# Patient Record
Sex: Female | Born: 1965
Health system: Southern US, Community
[De-identification: ages and names within clinical notes are randomized; demographics above are authoritative.]

## PROBLEM LIST (undated history)

## (undated) DIAGNOSIS — J302 Other seasonal allergic rhinitis: Secondary | ICD-10-CM

## (undated) DIAGNOSIS — IMO0001 Reserved for inherently not codable concepts without codable children: Secondary | ICD-10-CM

## (undated) DIAGNOSIS — N920 Excessive and frequent menstruation with regular cycle: Secondary | ICD-10-CM

## (undated) DIAGNOSIS — D649 Anemia, unspecified: Secondary | ICD-10-CM

## (undated) DIAGNOSIS — C801 Malignant (primary) neoplasm, unspecified: Secondary | ICD-10-CM

## (undated) DIAGNOSIS — T7840XA Allergy, unspecified, initial encounter: Secondary | ICD-10-CM

## (undated) DIAGNOSIS — F419 Anxiety disorder, unspecified: Secondary | ICD-10-CM

## (undated) DIAGNOSIS — M069 Rheumatoid arthritis, unspecified: Secondary | ICD-10-CM

## (undated) DIAGNOSIS — M1990 Unspecified osteoarthritis, unspecified site: Secondary | ICD-10-CM

## (undated) HISTORY — DX: Unspecified osteoarthritis, unspecified site: M19.90

## (undated) HISTORY — PX: SALPINGOOPHORECTOMY: SHX82

## (undated) HISTORY — DX: Allergy, unspecified, initial encounter: T78.40XA

## (undated) HISTORY — PX: OTHER SURGICAL HISTORY: SHX169

## (undated) HISTORY — PX: CHOLECYSTECTOMY: SHX55

## (undated) HISTORY — PX: ABDOMINAL HYSTERECTOMY: SHX81

---
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
## 1999-11-26 HISTORY — PX: APPENDECTOMY: SHX54

## 2002-11-25 DIAGNOSIS — C801 Malignant (primary) neoplasm, unspecified: Secondary | ICD-10-CM

## 2002-11-25 DIAGNOSIS — C439 Malignant melanoma of skin, unspecified: Secondary | ICD-10-CM

## 2002-11-25 HISTORY — DX: Malignant (primary) neoplasm, unspecified: C80.1

## 2002-11-25 HISTORY — DX: Malignant melanoma of skin, unspecified: C43.9

## 2005-04-02 ENCOUNTER — Ambulatory Visit: Payer: Self-pay | Admitting: Internal Medicine

## 2006-02-13 ENCOUNTER — Ambulatory Visit: Payer: Self-pay

## 2007-02-17 ENCOUNTER — Ambulatory Visit: Payer: Self-pay

## 2008-03-10 ENCOUNTER — Ambulatory Visit: Payer: Self-pay

## 2008-10-14 ENCOUNTER — Ambulatory Visit: Payer: Self-pay | Admitting: Family Medicine

## 2008-11-03 ENCOUNTER — Ambulatory Visit: Payer: Self-pay | Admitting: Internal Medicine

## 2012-11-10 ENCOUNTER — Ambulatory Visit: Payer: Self-pay | Admitting: Internal Medicine

## 2013-01-15 ENCOUNTER — Ambulatory Visit: Payer: Self-pay | Admitting: Emergency Medicine

## 2013-08-09 ENCOUNTER — Telehealth: Payer: Self-pay | Admitting: Internal Medicine

## 2013-08-09 ENCOUNTER — Ambulatory Visit: Payer: Self-pay | Admitting: Internal Medicine

## 2013-08-09 MED ORDER — CITALOPRAM HYDROBROMIDE 20 MG PO TABS: ORAL_TABLET | ORAL | Status: DC

## 2013-08-09 NOTE — Telephone Encounter (Signed)
LMTCB

## 2013-08-09 NOTE — Telephone Encounter (Signed)
Citalopram 10 mg 

## 2013-08-09 NOTE — Telephone Encounter (Signed)
Will need to confirm on 10mg  q day.  I am ok to refill #30 with one refill, but she will have to keep the appt.  I will not continue to refill after this.

## 2013-08-09 NOTE — Telephone Encounter (Signed)
Patient returned call, state she would like 20 mg Citalopram sent to the pharmacy and she just need enough to get her through until her appointment. She is only taking 1/2 of the 20 mg tablet so if you want to call her in 10 mg then she would be fine with that. Whatever you decide to do will be fine with her as long she has enough to last her until her appointment.

## 2013-08-09 NOTE — Telephone Encounter (Signed)
Sent in rx for citalopram 20mg  1/2 tablet - to get her through to her appt.  See my note.

## 2013-08-09 NOTE — Telephone Encounter (Signed)
Oka to refill? Pt rescheduled her NP appt to 09/21/13

## 2013-09-21 ENCOUNTER — Ambulatory Visit: Payer: Self-pay | Admitting: Internal Medicine

## 2013-10-14 ENCOUNTER — Ambulatory Visit: Payer: Self-pay | Admitting: Emergency Medicine

## 2013-10-31 ENCOUNTER — Other Ambulatory Visit: Payer: Self-pay | Admitting: Internal Medicine

## 2014-01-03 ENCOUNTER — Ambulatory Visit: Payer: Self-pay | Admitting: Physician Assistant

## 2014-03-05 ENCOUNTER — Ambulatory Visit: Payer: Self-pay

## 2014-09-15 ENCOUNTER — Ambulatory Visit: Payer: Self-pay | Admitting: Internal Medicine

## 2014-11-03 ENCOUNTER — Ambulatory Visit: Payer: Self-pay | Admitting: Physician Assistant

## 2014-12-30 ENCOUNTER — Ambulatory Visit: Payer: Self-pay | Admitting: Family Medicine

## 2015-06-26 ENCOUNTER — Ambulatory Visit
Admission: EM | Admit: 2015-06-26 | Discharge: 2015-06-26 | Disposition: A | Discharge status: Home / Self Care | Payer: 59 | Attending: Family Medicine | Admitting: Family Medicine

## 2015-06-26 DIAGNOSIS — H6593 Unspecified nonsuppurative otitis media, bilateral: Secondary | ICD-10-CM

## 2015-06-26 DIAGNOSIS — J309 Allergic rhinitis, unspecified: Secondary | ICD-10-CM

## 2015-06-26 DIAGNOSIS — J0101 Acute recurrent maxillary sinusitis: Secondary | ICD-10-CM | POA: Diagnosis not present

## 2015-06-26 HISTORY — DX: Anxiety disorder, unspecified: F41.9

## 2015-06-26 MED ORDER — CEFUROXIME AXETIL 250 MG PO TABS: 250.0000 mg | ORAL_TABLET | Freq: Two times a day (BID) | ORAL | Status: DC

## 2015-06-26 MED ORDER — ACETAMINOPHEN 500 MG PO TABS: 1000.0000 mg | ORAL_TABLET | Freq: Four times a day (QID) | ORAL | Status: DC | PRN

## 2015-06-26 MED ORDER — AZITHROMYCIN 250 MG PO TABS: 250.0000 mg | ORAL_TABLET | Freq: Every day | ORAL | Status: DC

## 2015-06-26 MED ORDER — IBUPROFEN 800 MG PO TABS: 800.0000 mg | ORAL_TABLET | Freq: Three times a day (TID) | ORAL | Status: DC

## 2015-06-26 NOTE — ED Notes (Signed)
Started last week with sinus congestion. Now worsening symptoms and increased pain behind eyes

## 2015-06-26 NOTE — ED Provider Notes (Signed)
CSN: [id number]     Arrival date & time 06/26/15  1041 History   First MD Initiated Contact with Patient 06/26/15 1132     Chief Complaint  Patient presents with  . 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  Typically sinus infection twice a year treated with azithromycin works well for her.  Does not do nose sprays.  Ckees, upper teeth and under eyes hurts, right ear itches.  Nonproductive cough.  Headache temples/cheeks.  Motrin 800mg  po q6h resolves pain/headache.  LMP 15 Jun 2015  Taking celexa every other day 1/2 tab.  Patient is a 49 y.o. female presenting with sinusitis. The history is provided by the patient.  Sinusitis Pain details:    Location:  Maxillary   Quality:  Aching   Severity:  Moderate   Duration:  2 weeks   Timing:  Constant Duration:  2 weeks Progression:  Worsening Chronicity:  Recurrent Context: allergies   Context: not chemical odor, not deviated nasal septum, not recent URI and not smoke inhalation   Relieved by:  Nothing Worsened by:  Position changes and outdoor exposure Ineffective treatments:  Acetaminophen Associated symptoms: congestion, cough, headaches and rhinorrhea   Associated symptoms: no chest pain, no chills, no ear pain, no fatigue, no fever, no hoarse voice, no mouth breathing, no nausea, no shortness of breath, no sneezing, no snoring, no sore throat, no swollen glands, no tooth pain, no vertigo, no vomiting and no wheezing   Congestion:    Location:  Nasal   Interferes with sleep: no     Interferes with eating/drinking: no   Cough:    Cough characteristics:  Non-productive   Severity:  Mild   Onset quality:  Sudden   Duration:  1 week   Timing:  Intermittent   Progression:  Waxing and waning    Chronicity:  New Headaches:    Severity:  Moderate   Onset quality:  Gradual   Duration:  1 week   Timing:  Intermittent   Progression:  Worsening   Chronicity:  Recurrent Rhinorrhea:    Quality:  Clear   Severity:  Mild   Duration:  1 week   Timing:  Intermittent   Progression:  Waxing and waning Risk factors: allergic reaction   Risk factors: no asthma, no BiPAP, no COPD, no CPAP use, no cystic fibrosis, no diabetes, no immune deficiency, no nasal cannula, no nasal polyps and no smoke exposure     Past Medical History  Diagnosis Date  . Anxiety    Past Surgical History  Procedure Laterality Date  . Appendectomy    . Cholecystectomy    . Cesarean section      x2   Family History  Problem Relation Age of Onset  . Hypertension Mother   . Hypertension Father   . Heart failure Father    History  Substance Use Topics  . Smoking status: Never Smoker   . Smokeless tobacco: Not on file  . Alcohol Use: No   OB History    No data available     Review of Systems  Constitutional: Negative for fever, chills, diaphoresis, activity change, appetite change and fatigue.  HENT: Positive for congestion, rhinorrhea and sinus pressure. Negative for dental problem, drooling, ear discharge, ear pain, facial swelling, hearing loss, hoarse voice, mouth sores, nosebleeds, postnasal drip, sneezing,  sore throat, tinnitus, trouble swallowing and voice change.   Eyes: Negative for photophobia, pain, discharge, redness, itching and visual disturbance.  Respiratory: Positive for cough. Negative for snoring, choking, chest tightness, shortness of breath, wheezing and stridor.   Cardiovascular: Negative for chest pain, palpitations and leg swelling.  Gastrointestinal: Negative for nausea, vomiting, abdominal pain, diarrhea, constipation and blood in stool.  Endocrine: Negative for cold intolerance and heat intolerance.  Genitourinary: Negative for dysuria and hematuria.  Musculoskeletal:  Negative for myalgias, back pain, joint swelling, arthralgias, gait problem, neck pain and neck stiffness.  Skin: Negative for color change, pallor, rash and wound.  Allergic/Immunologic: Positive for environmental allergies. Negative for food allergies.  Neurological: Positive for headaches. Negative for dizziness, vertigo, tremors, seizures, syncope, facial asymmetry, speech difficulty, weakness, light-headedness and numbness.  Hematological: Negative for adenopathy. Does not bruise/bleed easily.  Psychiatric/Behavioral: Negative for suicidal ideas, behavioral problems, confusion, sleep disturbance and agitation. The patient is not nervous/anxious.     Allergies  Penicillins  Home Medications   Prior to Admission medications   Medication Sig Start Date End Date Taking? Authorizing Provider  citalopram (CELEXA) 20 MG tablet 1/2 tablet q day 08/09/13  Yes Einar Pheasant, MD  acetaminophen (TYLENOL) 500 MG tablet Take 2 tablets (1,000 mg total) by mouth every 6 (six) hours as needed for moderate pain or fever. 06/26/15   Olen Cordial, NP  azithromycin (ZITHROMAX) 250 MG tablet Take 1 tablet (250 mg total) by mouth daily. Take first 2 tablets together, then 1 every day until finished. 06/26/15   Olen Cordial, NP  cefUROXime (CEFTIN) 250 MG tablet Take 1 tablet (250 mg total) by mouth 2 (two) times daily with a meal. 06/26/15   Olen Cordial, NP  ibuprofen (ADVIL,MOTRIN) 800 MG tablet Take 1 tablet (800 mg total) by mouth 3 (three) times daily. 06/26/15   Aura Fey Betancourt, NP   BP 143/71 mmHg  Pulse 94  Temp(Src) 98.1 F (36.7 C) (Tympanic)  Resp 18  Ht 5\' 3"  (1.6 m)  Wt 175 lb (79.379 kg)  BMI 31.01 kg/m2  SpO2 100%  LMP 06/15/2015 (Exact Date) Physical Exam  Constitutional: She is oriented to person, place, and time. Vital signs are normal. She appears well-developed and well-nourished. No distress.  HENT:  Head: Normocephalic and atraumatic.  Right Ear: Hearing, external ear  and ear canal normal. A middle ear effusion is present.  Left Ear: Hearing, tympanic membrane, external ear and ear canal normal.  Nose: Mucosal edema and rhinorrhea present. No nose lacerations, sinus tenderness, nasal deformity, septal deviation or nasal septal hematoma. No epistaxis.  No foreign bodies. Right sinus exhibits maxillary sinus tenderness. Right sinus exhibits no frontal sinus tenderness. Left sinus exhibits maxillary sinus tenderness. Left sinus exhibits no frontal sinus tenderness.  Mouth/Throat: Uvula is midline. Mucous membranes are not pale, not dry and not cyanotic. She does not have dentures. No oral lesions. No trismus in the jaw. Normal dentition. No dental abscesses, uvula swelling, lacerations or dental caries. Posterior oropharyngeal edema and posterior oropharyngeal erythema present. No oropharyngeal exudate or tonsillar abscesses.  Cobblestoning posterior pharynx; bilateral TMs with air fluid level; bilateral nasal turbinates with edema/erythema clear discharge; palpation frontal sinuses decreases discomfort/headache  Eyes: Conjunctivae, EOM and lids are normal. Pupils are equal, round, and reactive to light. Right eye exhibits no discharge. Left eye exhibits no discharge.  Neck: Trachea normal and normal range of motion. Neck supple. No tracheal deviation present. No thyromegaly present.  Cardiovascular: Normal  rate, regular rhythm, normal heart sounds and intact distal pulses.  Exam reveals no gallop and no friction rub.   No murmur heard. Pulmonary/Chest: Effort normal and breath sounds normal. No stridor. No respiratory distress. She has no decreased breath sounds. She has no wheezes. She has no rhonchi. She has no rales. She exhibits no tenderness.  Abdominal: Soft. Bowel sounds are normal. She exhibits no distension and no mass. There is no tenderness. There is no rebound and no guarding.  Dull to percussion x4 quads  Musculoskeletal: Normal range of motion. She  exhibits no edema or tenderness.  Lymphadenopathy:    She has no cervical adenopathy.  Neurological: She is alert and oriented to person, place, and time. She exhibits normal muscle tone. Coordination normal.  Skin: Skin is warm and intact. No rash noted. She is not diaphoretic. No erythema. No pallor.  Psychiatric: She has a normal mood and affect. Her speech is normal and behavior is normal. Judgment and thought content normal. Cognition and memory are normal.  Nursing note and vitals reviewed.   ED Course  Procedures (including critical care time) Labs Review Labs Reviewed - No data to display  Imaging Review No results found.   MDM   1. Acute recurrent maxillary sinusitis   2. Otitis media with effusion, bilateral   3. Allergic rhinitis, unspecified allergic rhinitis type    Allergic to penicillins.  Azithromycin has worked in the past to resolve biannual sinusitis.  Given Rx for azithromycin 500mg  today then 250mg  po daily  X 4 days.  Refuses nose sprays.  Given ceftin 250mg  po BID x 10 days if no improvement with azithromycin or worsening after 48 hours.  DIscussed with patient that 15% patients can have cross sensitivity to cephalosporins if penicillin allergy.  Levofloxin with black box warning now.  Discussed possible palpitations with azithromycin and celexa patient not taking 1/2 tab daily usually every other day.  She will monitor for palpitations and stop azithromycin if they occur.   No evidence of systemic bacterial infection, non toxic and well hydrated.  I do not see where any further testing or imaging is necessary at this time.   I will suggest supportive care, rest, good hygiene and encourage the patient to take adequate fluids.  The patient is to return to clinic or EMERGENCY ROOM if symptoms worsen or change significantly.  Exitcare handout on sinusitis given to patient. 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 Patient verbalized  agreement and understanding of treatment plan and had no further questions at this time.   P2:  Hand washing and cover cough  Supportive treatment.   No evidence of invasive bacterial infection, non toxic and well hydrated.  This is most likely self limiting viral infection.  I do not see where any further testing or imaging is necessary at this time.   I will suggest supportive care, rest, good hygiene and encourage the patient to take adequate fluids.  The patient is to return to clinic or EMERGENCY ROOM if symptoms worsen or change significantly e.g. ear pain, fever, purulent discharge from ears or bleeding.   Patient verbalized agreement and understanding of treatment plan.    Patient may use normal saline nasal spray as needed.  Consider antihistamine or nasal steroid use.  Avoid triggers if possible.  Shower prior to bedtime if exposed to triggers.  If allergic dust/dust mites recommend mattress/pillow covers/encasements; washing linens, vacuuming, sweeping, dusting weekly.  Call or return to  clinic as needed if these symptoms worsen or fail to improve as anticipated.     Patient verbalized understanding of instructions, agreed with plan of care and had no further questions at this time.  P2:  Avoidance and hand washing.  Olen Cordial, NP 06/26/15 1839

## 2015-06-26 NOTE — Discharge Instructions (Signed)
Otitis Media With Effusion Otitis media with effusion is the presence of fluid in the middle ear. This is a common problem in children, which often follows ear infections. It may be present for weeks or longer after the infection. Unlike an acute ear infection, otitis media with effusion refers only to fluid behind the ear drum and not infection. Children with repeated ear and sinus infections and allergy problems are the most likely to get otitis media with effusion. CAUSES  The most frequent cause of the fluid buildup is dysfunction of the eustachian tubes. These are the tubes that drain fluid in the ears to the back of the nose (nasopharynx). SYMPTOMS   The main symptom of this condition is hearing loss. As a result, you or your child may:  Listen to the TV at a loud volume.  Not respond to questions.  Ask "what" often when spoken to.  Mistake or confuse one sound or word for another.  There may be a sensation of fullness or pressure but usually not pain. DIAGNOSIS   Your health care provider will diagnose this condition by examining you or your child's ears.  Your health care provider may test the pressure in you or your child's ear with a tympanometer.  A hearing test may be conducted if the problem persists. TREATMENT   Treatment depends on the duration and the effects of the effusion.  Antibiotics, decongestants, nose drops, and cortisone-type drugs (tablets or nasal spray) may not be helpful.  Children with persistent ear effusions may have delayed language or behavioral problems. Children at risk for developmental delays in hearing, learning, and speech may require referral to a specialist earlier than children not at risk.  You or your child's health care provider may suggest a referral to an ear, nose, and throat surgeon for treatment. The following may help restore normal hearing:  Drainage of fluid.  Placement of ear tubes (tympanostomy tubes).  Removal of adenoids  (adenoidectomy). HOME CARE INSTRUCTIONS   Avoid secondhand smoke.  Infants who are breastfed are less likely to have this condition.  Avoid feeding infants while they are lying flat.  Avoid known environmental allergens.  Avoid people who are sick. SEEK MEDICAL CARE IF:   Hearing is not better in 3 months.  Hearing is worse.  Ear pain.  Drainage from the ear.  Dizziness. MAKE SURE YOU:   Understand these instructions.  Will watch your condition.  Will get help right away if you are not doing well or get worse. Document Released: 12/19/2004 Document Revised: 03/28/2014 Document Reviewed: 06/08/2013 [facility] Patient Information 2015 [place], [place]. This information is not intended to replace advice given to you by your health care provider. Make sure you discuss any questions you have with your health care provider. Sinusitis Sinusitis is redness, soreness, and inflammation of the paranasal sinuses. Paranasal sinuses are air pockets within the bones of your face (beneath the eyes, the middle of the forehead, or above the eyes). In healthy paranasal sinuses, mucus is able to drain out, and air is able to circulate through them by way of your nose. However, when your paranasal sinuses are inflamed, mucus and air can become trapped. This can allow bacteria and other germs to grow and cause infection. Sinusitis can develop quickly and last only a short time (acute) or continue over a long period (chronic). Sinusitis that lasts for more than 12 weeks is considered chronic.  CAUSES  Causes of sinusitis include:  Allergies.  Structural abnormalities, such as  displacement of the cartilage that separates your nostrils (deviated septum), which can decrease the air flow through your nose and sinuses and affect sinus drainage.  Functional abnormalities, such as when the small hairs (cilia) that line your sinuses and help remove mucus do not work properly or are not present. SIGNS AND  SYMPTOMS  Symptoms of acute and chronic sinusitis are the same. The primary symptoms are pain and pressure around the affected sinuses. Other symptoms include:  Upper toothache.  Earache.  Headache.  Bad breath.  Decreased sense of smell and taste.  A cough, which worsens when you are lying flat.  Fatigue.  Fever.  Thick drainage from your nose, which often is green and may contain pus (purulent).  Swelling and warmth over the affected sinuses. DIAGNOSIS  Your health care provider will perform a physical exam. During the exam, your health care provider may:  Look in your nose for signs of abnormal growths in your nostrils (nasal polyps).  Tap over the affected sinus to check for signs of infection.  View the inside of your sinuses (endoscopy) using an imaging device that has a light attached (endoscope). If your health care provider suspects that you have chronic sinusitis, one or more of the following tests may be recommended:  Allergy tests.  Nasal culture. A sample of mucus is taken from your nose, sent to a lab, and screened for bacteria.  Nasal cytology. A sample of mucus is taken from your nose and examined by your health care provider to determine if your sinusitis is related to an allergy. TREATMENT  Most cases of acute sinusitis are related to a viral infection and will resolve on their own within 10 days. Sometimes medicines are prescribed to help relieve symptoms (pain medicine, decongestants, nasal steroid sprays, or saline sprays).  However, for sinusitis related to a bacterial infection, your health care provider will prescribe antibiotic medicines. These are medicines that will help kill the bacteria causing the infection.  Rarely, sinusitis is caused by a fungal infection. In theses cases, your health care provider will prescribe antifungal medicine. For some cases of chronic sinusitis, surgery is needed. Generally, these are cases in which sinusitis recurs  more than 3 times per year, despite other treatments. HOME CARE INSTRUCTIONS   Drink plenty of water. Water helps thin the mucus so your sinuses can drain more easily.  Use a humidifier.  Inhale steam 3 to 4 times a day (for example, sit in the bathroom with the shower running).  Apply a warm, moist washcloth to your face 3 to 4 times a day, or as directed by your health care provider.  Use saline nasal sprays to help moisten and clean your sinuses.  Take medicines only as directed by your health care provider.  If you were prescribed either an antibiotic or antifungal medicine, finish it all even if you start to feel better. SEEK IMMEDIATE MEDICAL CARE IF:  You have increasing pain or severe headaches.  You have nausea, vomiting, or drowsiness.  You have swelling around your face.  You have vision problems.  You have a stiff neck.  You have difficulty breathing. MAKE SURE YOU:   Understand these instructions.  Will watch your condition.  Will get help right away if you are not doing well or get worse. Document Released: 11/11/2005 Document Revised: 03/28/2014 Document Reviewed: 11/26/2011 [facility] Patient Information 2015 [place], [place]. This information is not intended to replace advice given to you by your health care provider. Make sure  you discuss any questions you have with your health care provider.

## 2015-09-01 ENCOUNTER — Ambulatory Visit
Admission: EM | Admit: 2015-09-01 | Discharge: 2015-09-01 | Disposition: A | Discharge status: Home / Self Care | Payer: Managed Care, Other (non HMO) | Attending: Family Medicine | Admitting: Family Medicine

## 2015-09-01 ENCOUNTER — Encounter: Payer: Self-pay | Admitting: Emergency Medicine

## 2015-09-01 DIAGNOSIS — J0100 Acute maxillary sinusitis, unspecified: Secondary | ICD-10-CM | POA: Diagnosis not present

## 2015-09-01 DIAGNOSIS — J0110 Acute frontal sinusitis, unspecified: Secondary | ICD-10-CM | POA: Diagnosis not present

## 2015-09-01 MED ORDER — IBUPROFEN 800 MG PO TABS: 800.0000 mg | ORAL_TABLET | Freq: Three times a day (TID) | ORAL | Status: DC | PRN

## 2015-09-01 MED ORDER — AZITHROMYCIN 250 MG PO TABS: ORAL_TABLET | ORAL | Status: DC

## 2015-09-01 NOTE — Discharge Instructions (Signed)

## 2015-09-01 NOTE — ED Notes (Signed)
Sinus , facial pain cough green sputum ear pain for 2 weeks

## 2015-09-01 NOTE — ED Provider Notes (Signed)
[facility] Provider Note  ____________________________________________  Time seen: Approximately 11:54 AM  I have reviewed the triage vital signs and the nursing notes.   HISTORY  Chief Complaint Facial Pain   HPI Robin Kelly is a 49 y.o. female presents for complaint of runny nose, congestion and sinus pressure. States runny nose and congestion x 2 weeks, worsened with sinus pressure over last few days. States occasional cough. Denies fever, chest pain, shortness of breath or abdominal pain. Reports continues to eat and drink well.    Past Medical History  Diagnosis Date  . Anxiety     There are no active problems to display for this patient.   Past Surgical History  Procedure Laterality Date  . Appendectomy    . Cholecystectomy    . Cesarean section      x2    Current Outpatient Rx  Name  Route  Sig  Dispense  Refill  .           .           .           .           .             Allergies Penicillins  Family History  Problem Relation Age of Onset  . Hypertension Mother   . Hypertension Father   . Heart failure Father     Social History Social History  Substance Use Topics  . Smoking status: Never Smoker   . Smokeless tobacco: None  . Alcohol Use: No    Review of Systems Constitutional: No fever/chills Eyes: No visual changes. ENT: positive runny nose, congestion and sinus pressure. Positive ear "fullness"  Cardiovascular: Denies chest pain. Respiratory: Denies shortness of breath. Gastrointestinal: No abdominal pain.  No nausea, no vomiting.  No diarrhea.  No constipation. Genitourinary: Negative for dysuria. Musculoskeletal: Negative for back pain. Skin: Negative for rash. Neurological: Negative for headaches, focal weakness or numbness.  10-point ROS otherwise negative.  ____________________________________________   PHYSICAL EXAM:  VITAL SIGNS: ED Triage Vitals  Enc Vitals Group      BP 09/01/15 1131 120/65 mmHg     Pulse Rate 09/01/15 1131 98     Resp 09/01/15 1131 16     Temp 09/01/15 1131 98.2 F (36.8 C)     Temp Source 09/01/15 1131 Tympanic     SpO2 09/01/15 1131 99 %     Weight 09/01/15 1131 170 lb (77.111 kg)     Height 09/01/15 1131 5\' 2"  (1.575 m)     Head Cir --      Peak Flow --      Pain Score 09/01/15 1135 5     Pain Loc --      Pain Edu? --      Excl. in [place]? --     Constitutional: Alert and oriented. Well appearing and in no acute distress. Eyes: Conjunctivae are normal. PERRL. EOMI. Head: Atraumatic. Mod TTP bilateral maxillary and frontal sinus. No erythema  Ears: no erythema, bilateral serous otitis  Nose: nasal congestion with bilateral turbinate edema and bogginess, nares patent.   Mouth/Throat: Mucous membranes are moist.  Oropharynx non-erythematous. Neck: No stridor.  No cervical spine tenderness to palpation. Hematological/Lymphatic/Immunilogical: No cervical lymphadenopathy. Cardiovascular: Normal rate, regular rhythm. Grossly normal heart sounds.  Good peripheral circulation. Respiratory: Normal respiratory effort.  No retractions. Lungs CTAB. Gastrointestinal: Soft and nontender. No distention. Normal Bowel  sounds. No CVA tenderness. Musculoskeletal: No lower or upper extremity tenderness nor edema.  No joint effusions. Bilateral pedal pulses equal and easily palpated.  Neurologic:  Normal speech and language. No gross focal neurologic deficits are appreciated. No gait instability. Skin:  Skin is warm, dry and intact. No rash noted. Psychiatric: Mood and affect are normal. Speech and behavior are normal.  ____________________________________________   LABS (all labs ordered are listed, but only abnormal results are displayed)  Labs Reviewed - No data to display ____________________________________________   INITIAL IMPRESSION / ASSESSMENT AND PLAN / ED COURSE  Pertinent labs & imaging results that were available during  my care of the patient were reviewed by me and considered in my medical decision making (see chart for details).  Very well appearing. No acute distress. Sinus congestion and pressure x 2 weeks. Will treat with oral azithromycin, prn ibuprofen and supportive measures. Discussed follow up with Primary care physician this week. Discussed follow up and return parameters including no resolution or any worsening concerns. Patient verbalized understanding and agreed to plan.   ____________________________________________   FINAL CLINICAL IMPRESSION(S) / ED DIAGNOSES  Final diagnoses:  Acute maxillary sinusitis, recurrence not specified  Acute frontal sinusitis, recurrence not specified       Marylene Land, NP 09/01/15 1205

## 2015-12-22 LAB — HM PAP SMEAR

## 2015-12-27 LAB — HM MAMMOGRAPHY

## 2016-01-25 ENCOUNTER — Other Ambulatory Visit: Payer: Managed Care, Other (non HMO)

## 2016-01-30 ENCOUNTER — Encounter
Admission: RE | Admit: 2016-01-30 | Discharge: 2016-01-30 | Disposition: A | Discharge status: Home / Self Care | Payer: Managed Care, Other (non HMO) | Source: Ambulatory Visit | Attending: Obstetrics and Gynecology | Admitting: Obstetrics and Gynecology

## 2016-01-30 ENCOUNTER — Other Ambulatory Visit: Payer: Managed Care, Other (non HMO)

## 2016-01-30 DIAGNOSIS — Z01812 Encounter for preprocedural laboratory examination: Secondary | ICD-10-CM | POA: Diagnosis present

## 2016-01-30 DIAGNOSIS — Z01810 Encounter for preprocedural cardiovascular examination: Secondary | ICD-10-CM | POA: Insufficient documentation

## 2016-01-30 HISTORY — DX: Excessive and frequent menstruation with regular cycle: N92.0

## 2016-01-30 HISTORY — DX: Reserved for inherently not codable concepts without codable children: IMO0001

## 2016-01-30 LAB — CBC
HCT: 34.9 % — ABNORMAL LOW (ref 35.0–47.0)
Hemoglobin: 10.9 g/dL — ABNORMAL LOW (ref 12.0–16.0)
MCH: 26.3 pg (ref 26.0–34.0)
MCHC: 31.3 g/dL — AB (ref 32.0–36.0)
MCV: 83.9 fL (ref 80.0–100.0)
Platelets: 612 10*3/uL — ABNORMAL HIGH (ref 150–440)
RBC: 4.16 MIL/uL (ref 3.80–5.20)
RDW: 22.5 % — AB (ref 11.5–14.5)
WBC: 9 10*3/uL (ref 3.6–11.0)

## 2016-01-30 LAB — COMPREHENSIVE METABOLIC PANEL
ALBUMIN: 4.1 g/dL (ref 3.5–5.0)
ALT: 24 U/L (ref 14–54)
ANION GAP: 9 (ref 5–15)
AST: 21 U/L (ref 15–41)
Alkaline Phosphatase: 66 U/L (ref 38–126)
BUN: 9 mg/dL (ref 6–20)
CO2: 21 mmol/L — AB (ref 22–32)
Calcium: 9 mg/dL (ref 8.9–10.3)
Chloride: 110 mmol/L (ref 101–111)
Creatinine, Ser: 0.65 mg/dL (ref 0.44–1.00)
GFR calc non Af Amer: >60 mL/min (ref >60)
GLUCOSE: 73 mg/dL (ref 65–99)
POTASSIUM: 4 mmol/L (ref 3.5–5.1)
SODIUM: 140 mmol/L (ref 135–145)
TOTAL PROTEIN: 7.2 g/dL (ref 6.5–8.1)
Total Bilirubin: 0.3 mg/dL (ref 0.3–1.2)

## 2016-01-30 LAB — TYPE AND SCREEN
ABO/RH(D): O POS
ANTIBODY SCREEN: NEGATIVE

## 2016-01-30 LAB — ABO/RH: ABO/RH(D): O POS

## 2016-01-30 NOTE — Pre-Procedure Instructions (Signed)
AS INSTRUCTED BY DR Amie Critchley, EKG,LLABS CALLED AND FAXED TO DR C SCOTT. SPOKE WITH RASHEEDA. ALSO CALLED AND FAXED TO DR Glennon Mac. SPOKE WITH LYDIA

## 2016-01-30 NOTE — Patient Instructions (Signed)
  Your procedure is scheduled on: 02/06/16 Tues Report to Day Surgery.2nd floor medical mall To find out your arrival time please call [phone] between 1PM - 3PM on 02/05/16 Mon.  Remember: Instructions that are not followed completely may result in serious medical risk, up to and including death, or upon the discretion of your surgeon and anesthesiologist your surgery may need to be rescheduled.    _x___ 1. Do not eat food or drink liquids after midnight. No gum chewing or hard candies.     ____ 2. No Alcohol for 24 hours before or after surgery.   ____ 3. Bring all medications with you on the day of surgery if instructed.    _x___ 4. Notify your doctor if there is any change in your medical condition     (cold, fever, infections).     Do not wear jewelry, make-up, hairpins, clips or nail polish.  Do not wear lotions, powders, or perfumes. You may wear deodorant.  Do not shave 48 hours prior to surgery. Men may shave face and neck.  Do not bring valuables to the hospital.    [facility] is not responsible for any belongings or valuables.               Contacts, dentures or bridgework may not be worn into surgery.  Leave your suitcase in the car. After surgery it may be brought to your room.  For patients admitted to the hospital, discharge time is determined by your                treatment team.   Patients discharged the day of surgery will not be allowed to drive home.   Please read over the following fact sheets that you were given:      ____ Take these medicines the morning of surgery with A SIP OF WATER:    1. None  2.   3.   4.  5.  6.  ____ Fleet Enema (as directed)   ____ Use CHG Soap as directed  ____ Use inhalers on the day of surgery  ____ Stop metformin 2 days prior to surgery    ____ Take 1/2 of usual insulin dose the night before surgery and none on the morning of surgery.   _x___ Stop Coumadin/Plavix/aspirin on Stop aspirin today may take  Tylenol  ____ Stop Anti-inflammatories on    ____ Stop supplements until after surgery.    ____ Bring C-Pap to the hospital.

## 2016-02-02 ENCOUNTER — Telehealth: Payer: Self-pay | Admitting: Internal Medicine

## 2016-02-02 NOTE — Telephone Encounter (Signed)
Robin Kelly [id number] called from [facility] stating pt is needing a surgery clearance due to EKG and platelet count 612. Pt is due for Surgery on 02/06/2016. No appt avail to sch. Let me know where to put pt? Robin Kelly states she's sorry she just found out. Thank you!

## 2016-02-02 NOTE — Pre-Procedure Instructions (Signed)
Received fax from Dr Nicki Reaper office (was sent 02/01/16 at 1702) noting patient had not been seen in their office in "years". Contacted Nancy at [place] and sent copies to her.

## 2016-02-02 NOTE — Telephone Encounter (Signed)
Disregard needing appt. Robin Kelly called back from [facility] stating that pt is going to Dr Ubaldo Glassing office at [facility]. Thank You!

## 2016-02-05 NOTE — Pre-Procedure Instructions (Signed)
[place] RISK 02/02/16

## 2016-02-06 ENCOUNTER — Ambulatory Visit: Payer: Managed Care, Other (non HMO) | Admitting: Anesthesiology

## 2016-02-06 ENCOUNTER — Encounter
Admission: RE | Disposition: A | Discharge status: Home / Self Care | Payer: Self-pay | Source: Ambulatory Visit | Attending: Obstetrics and Gynecology

## 2016-02-06 ENCOUNTER — Ambulatory Visit
Admission: RE | Admit: 2016-02-06 | Discharge: 2016-02-06 | Disposition: A | Discharge status: Home / Self Care | Payer: Managed Care, Other (non HMO) | Source: Ambulatory Visit | Attending: Obstetrics and Gynecology | Admitting: Obstetrics and Gynecology

## 2016-02-06 DIAGNOSIS — Z79899 Other long term (current) drug therapy: Secondary | ICD-10-CM | POA: Insufficient documentation

## 2016-02-06 DIAGNOSIS — N840 Polyp of corpus uteri: Secondary | ICD-10-CM | POA: Diagnosis not present

## 2016-02-06 DIAGNOSIS — Z9049 Acquired absence of other specified parts of digestive tract: Secondary | ICD-10-CM | POA: Insufficient documentation

## 2016-02-06 DIAGNOSIS — R0602 Shortness of breath: Secondary | ICD-10-CM | POA: Diagnosis not present

## 2016-02-06 DIAGNOSIS — D259 Leiomyoma of uterus, unspecified: Secondary | ICD-10-CM | POA: Diagnosis not present

## 2016-02-06 DIAGNOSIS — N920 Excessive and frequent menstruation with regular cycle: Secondary | ICD-10-CM | POA: Diagnosis not present

## 2016-02-06 DIAGNOSIS — F419 Anxiety disorder, unspecified: Secondary | ICD-10-CM | POA: Insufficient documentation

## 2016-02-06 DIAGNOSIS — N939 Abnormal uterine and vaginal bleeding, unspecified: Secondary | ICD-10-CM | POA: Insufficient documentation

## 2016-02-06 DIAGNOSIS — Z880 Allergy status to penicillin: Secondary | ICD-10-CM | POA: Diagnosis not present

## 2016-02-06 DIAGNOSIS — D219 Benign neoplasm of connective and other soft tissue, unspecified: Secondary | ICD-10-CM | POA: Diagnosis present

## 2016-02-06 DIAGNOSIS — N921 Excessive and frequent menstruation with irregular cycle: Secondary | ICD-10-CM | POA: Diagnosis present

## 2016-02-06 DIAGNOSIS — D649 Anemia, unspecified: Secondary | ICD-10-CM | POA: Insufficient documentation

## 2016-02-06 HISTORY — PX: DILATATION & CURETTAGE/HYSTEROSCOPY WITH MYOSURE: SHX6511

## 2016-02-06 HISTORY — PX: POLYPECTOMY: SHX5525

## 2016-02-06 LAB — POCT PREGNANCY, URINE: Preg Test, Ur: NEGATIVE

## 2016-02-06 SURGERY — DILATATION & CURETTAGE/HYSTEROSCOPY WITH MYOSURE
Anesthesia: General

## 2016-02-06 MED ORDER — ONDANSETRON HCL 4 MG/2ML IJ SOLN
INTRAMUSCULAR | Status: DC | PRN
  Administered 2016-02-06: 4 mg via INTRAVENOUS

## 2016-02-06 MED ORDER — PROPOFOL 10 MG/ML IV BOLUS
INTRAVENOUS | Status: DC | PRN
  Administered 2016-02-06: 200 mg via INTRAVENOUS

## 2016-02-06 MED ORDER — FAMOTIDINE 20 MG PO TABS
ORAL_TABLET | ORAL | Status: AC
  Administered 2016-02-06: 20 mg via ORAL
  Filled 2016-02-06: qty 1

## 2016-02-06 MED ORDER — MIDAZOLAM HCL 2 MG/2ML IJ SOLN
INTRAMUSCULAR | Status: DC | PRN
  Administered 2016-02-06: 2 mg via INTRAVENOUS

## 2016-02-06 MED ORDER — FAMOTIDINE 20 MG PO TABS
20.0000 mg | ORAL_TABLET | Freq: Once | ORAL | Status: AC
  Administered 2016-02-06: 20 mg via ORAL

## 2016-02-06 MED ORDER — LACTATED RINGERS IV SOLN
INTRAVENOUS | Status: DC
  Administered 2016-02-06: 15:00:00 via INTRAVENOUS

## 2016-02-06 MED ORDER — IBUPROFEN 600 MG PO TABS: 600.0000 mg | ORAL_TABLET | Freq: Four times a day (QID) | ORAL | Status: DC | PRN

## 2016-02-06 MED ORDER — LACTATED RINGERS IR SOLN
Status: DC | PRN
  Administered 2016-02-06: 2100 mL

## 2016-02-06 MED ORDER — LACTATED RINGERS IV SOLN: INTRAVENOUS | Status: DC

## 2016-02-06 MED ORDER — SILVER NITRATE-POT NITRATE 75-25 % EX MISC
CUTANEOUS | Status: DC | PRN
  Administered 2016-02-06: 2

## 2016-02-06 MED ORDER — FENTANYL CITRATE (PF) 100 MCG/2ML IJ SOLN
INTRAMUSCULAR | Status: DC | PRN
  Administered 2016-02-06 (×2): 50 ug via INTRAVENOUS

## 2016-02-06 MED ORDER — FENTANYL CITRATE (PF) 100 MCG/2ML IJ SOLN: 25.0000 ug | INTRAMUSCULAR | Status: DC | PRN

## 2016-02-06 MED ORDER — SILVER NITRATE-POT NITRATE 75-25 % EX MISC
CUTANEOUS | Status: AC
  Filled 2016-02-06: qty 4

## 2016-02-06 MED ORDER — TRAMADOL HCL 50 MG PO TABS: 50.0000 mg | ORAL_TABLET | Freq: Four times a day (QID) | ORAL | Status: DC | PRN

## 2016-02-06 MED ORDER — DEXAMETHASONE SODIUM PHOSPHATE 10 MG/ML IJ SOLN
INTRAMUSCULAR | Status: DC | PRN
  Administered 2016-02-06: 5 mg via INTRAVENOUS

## 2016-02-06 SURGICAL SUPPLY — 21 items
ABLATOR ENDOMETRIAL MYOSURE (ABLATOR) ×3 IMPLANT
CANISTER SUC SOCK COL 7IN (MISCELLANEOUS) ×3 IMPLANT
CATH ROBINSON RED A/P 16FR (CATHETERS) ×3 IMPLANT
DRESSING TELFA 4X3 1S ST N-ADH (GAUZE/BANDAGES/DRESSINGS) ×3 IMPLANT
ELECT REM PT RETURN 9FT ADLT (ELECTROSURGICAL) ×3
ELECTRODE REM PT RTRN 9FT ADLT (ELECTROSURGICAL) ×1 IMPLANT
GLOVE BIO SURGEON STRL SZ7 (GLOVE) ×3 IMPLANT
GLOVE BIOGEL PI IND STRL 7.5 (GLOVE) ×1 IMPLANT
GLOVE BIOGEL PI INDICATOR 7.5 (GLOVE) ×2
GOWN STRL REUS W/ TWL LRG LVL3 (GOWN DISPOSABLE) ×1 IMPLANT
GOWN STRL REUS W/TWL LRG LVL3 (GOWN DISPOSABLE) ×2
IV LACTATED RINGER IRRG 3000ML (IV SOLUTION) ×2
IV LR IRRIG 3000ML ARTHROMATIC (IV SOLUTION) ×1 IMPLANT
KIT RM TURNOVER CYSTO AR (KITS) ×3 IMPLANT
MYOSURE LITE POLYP REMOVAL (MISCELLANEOUS) ×3 IMPLANT
PACK DNC HYST (MISCELLANEOUS) ×3 IMPLANT
PAD OB MATERNITY 4.3X12.25 (PERSONAL CARE ITEMS) ×3 IMPLANT
PAD PREP 24X41 OB/GYN DISP (PERSONAL CARE ITEMS) ×3 IMPLANT
TUBING CONNECTING 10 (TUBING) ×2 IMPLANT
TUBING CONNECTING 10' (TUBING) ×1
TUBING HYSTEROSCOPY DOLPHIN (MISCELLANEOUS) ×3 IMPLANT

## 2016-02-06 NOTE — Discharge Instructions (Signed)

## 2016-02-06 NOTE — H&P (Signed)
History and Physical Interval Note:  Robin Kelly  has presented today for surgery, with the diagnosis of abnormal uterine bleeding,endometrial polyp  The various methods of treatment have been discussed with the patient and family. After consideration of risks, benefits and other options for treatment, the patient has consented to  Procedure(s): [place] (N/A) POLYPECTOMY (N/A) as a surgical intervention .  The patient's history has been reviewed, patient examined, no change in status, stable for surgery.  I have reviewed the patient's chart and labs.  Questions were answered to the patient's satisfaction.     Will Bonnet, MD 02/06/2016 3:56 PM

## 2016-02-06 NOTE — Anesthesia Postprocedure Evaluation (Signed)
Anesthesia Post Note  Patient: Robin Kelly  Procedure(s) Performed: Procedure(s) (LRB): DILATATION & CURETTAGE HYSTEROSCOPY  polypectomy (N/A) POLYPECTOMY (N/A)  Patient location during evaluation: PACU Anesthesia Type: General Level of consciousness: awake and alert Pain management: pain level controlled Vital Signs Assessment: post-procedure vital signs reviewed and stable Respiratory status: spontaneous breathing, nonlabored ventilation, respiratory function stable and patient connected to nasal cannula oxygen Cardiovascular status: blood pressure returned to baseline and stable Postop Assessment: no signs of nausea or vomiting Anesthetic complications: no    Last Vitals:  Filed Vitals:   02/06/16 1747 02/06/16 1810  BP: 166/75 151/81  Pulse: 99 100  Temp:  36.7 C  Resp: 16 16    Last Pain:  Filed Vitals:   02/06/16 1811  PainSc: 0-No pain                 Precious Haws Shiasia Porro

## 2016-02-06 NOTE — Op Note (Signed)
Operative Note   02/06/2016  PRE-OP DIAGNOSIS:  Patient Active Problem List   Diagnosis Date Noted  . Menorrhagia with irregular cycle 02/06/2016  . Endometrial polyp 02/06/2016  . Fibroids 02/06/2016    POST-OP DIAGNOSIS:  Patient Active Problem List   Diagnosis Date Noted  . Menorrhagia with irregular cycle 02/06/2016  . Endometrial polyp 02/06/2016  . Fibroids 02/06/2016    SURGEON: Surgeon(s) and Role:    * Will Bonnet, MD - Primary  PROCEDURE: Procedure(s): 1) DILATATION & CURETTAGE HYSTEROSCOPY   2) POLYPECTOMY   ANESTHESIA: General LMA  ESTIMATED BLOOD LOSS: 25 mL  DRAINS: None   TOTAL IV FLUIDS: 600 mL  SPECIMENS:  Endometrial curettings  VTE PROPHYLAXIS: SCDs to the bilateral lower extremities  ANTIBIOTICS: None indicated or given  FLUID DEFICIT: minimal  COMPLICATIONS: None  DISPOSITION: PACU - hemodynamically stable.  CONDITION: stable  FINDINGS: Exam under anesthesia revealed small, mobile 6 week uterus with no masses and bilateral adnexa without masses or fullness. Hysteroscopy revealed an abnormal appearing uterine cavity with diffuse intracavitary small projection (some appear grape-like others appear polypoid) with bilateral tubal ostia visualized and normal appearing endocervical canal.  PROCEDURE IN DETAIL:  After informed consent was obtained, the patient was taken to the operating room where anesthesia was obtained without difficulty. The patient was positioned in the dorsal lithotomy position in candy cane stirrups.  The patient's bladder was catheterized with an in and out foley catheter.  The patient was examined under anesthesia, with the above noted findings.  The bi-valved speculum was placed inside the patient's vagina, and the the anterior lip of the cervix was seen and grasped with the tenaculum.  The cervix was progressively dilated to 69mm using a Hegar dilator.  The hysteroscope was introduced, with the above noted findings. The  hystersocope was removed and the uterine cavity was curetted.  There was not much return on the curetting despite using moderate force along the uterine wall.  Some material did return but was not concordant to the hysteroscopic findings. Excellent hemostasis was noted, and all instruments were removed, with excellent hemostasis noted throughout.  She was then taken out of dorsal lithotomy.  The patient tolerated the procedure well.  Sponge, lap and needle counts were correct x2.  The patient was taken to recovery room in excellent condition.  Will Bonnet, MD, [place] 02/06/2016 4:57 PM

## 2016-02-06 NOTE — Transfer of Care (Signed)
Immediate Anesthesia Transfer of Care Note  Patient: Robin Kelly  Procedure(s) Performed: Procedure(s): DILATATION & CURETTAGE HYSTEROSCOPY  polypectomy (N/A) POLYPECTOMY (N/A)  Patient Location: PACU  Anesthesia Type:General  Level of Consciousness: awake  Airway & Oxygen Therapy: Patient Spontanous Breathing and Patient connected to face mask oxygen  Post-op Assessment: Report given to RN and Post -op Vital signs reviewed and stable  Post vital signs: stable  Last Vitals:  Filed Vitals:   02/06/16 1436 02/06/16 1702  BP: 158/93 128/80  Pulse: 115 115  Temp: 37.2 C 36.8 C  Resp: 16 26    Complications: No apparent anesthesia complications

## 2016-02-06 NOTE — Anesthesia Procedure Notes (Signed)
Procedure Name: LMA Insertion Date/Time: 02/06/2016 4:14 PM Performed by: Aline Brochure Pre-anesthesia Checklist: Patient identified, Emergency Drugs available, Suction available and Patient being monitored Patient Re-evaluated:Patient Re-evaluated prior to inductionOxygen Delivery Method: Circle system utilized Preoxygenation: Pre-oxygenation with 100% oxygen Intubation Type: IV induction Ventilation: Mask ventilation without difficulty LMA: LMA inserted LMA Size: 3.5 Number of attempts: 1 Airway Equipment and Method: Patient positioned with wedge pillow Placement Confirmation: positive ETCO2 and breath sounds checked- equal and bilateral Tube secured with: Tape Dental Injury: Teeth and Oropharynx as per pre-operative assessment

## 2016-02-06 NOTE — Anesthesia Preprocedure Evaluation (Signed)
Anesthesia Evaluation  Patient identified by MRN, date of birth, ID band Patient awake    Reviewed: Allergy & Precautions, H&P , NPO status , Patient's Chart, lab work & pertinent test results  History of Anesthesia Complications Negative for: history of anesthetic complications  Airway Mallampati: III  TM Distance: >3 FB Neck ROM: limited    Dental  (+) Poor Dentition   Pulmonary shortness of breath and with exertion,    Pulmonary exam normal breath sounds clear to auscultation       Cardiovascular Exercise Tolerance: Good (-) angina+ DOE  (-) Past MI Normal cardiovascular exam Rhythm:regular Rate:Normal     Neuro/Psych PSYCHIATRIC DISORDERS Anxiety negative neurological ROS     GI/Hepatic negative GI ROS, Neg liver ROS,   Endo/Other  negative endocrine ROS  Renal/GU negative Renal ROS  negative genitourinary   Musculoskeletal   Abdominal   Peds  Hematology  (+) Blood dyscrasia, anemia ,   Anesthesia Other Findings Past Medical History:   Anxiety                                                      Heavy menstrual bleeding                                     Shortness of breath dyspnea                                    Comment:"due to anemia"  Past Surgical History:   APPENDECTOMY                                                  CHOLECYSTECTOMY                                               CESAREAN SECTION                                                Comment:x2  BMI    Body Mass Index   32.91 kg/m 2    Signs and symptoms suggestive of sleep apnea    Reproductive/Obstetrics negative OB ROS                             Anesthesia Physical Anesthesia Plan  ASA: III  Anesthesia Plan: General LMA   Post-op Pain Management:    Induction:   Airway Management Planned:   Additional Equipment:   Intra-op Plan:   Post-operative Plan:   Informed Consent: I have  reviewed the patients History and Physical, chart, labs and discussed the procedure including the risks, benefits and alternatives for the proposed anesthesia with the patient or authorized representative who has indicated his/her understanding and acceptance.   Dental  Advisory Given  Plan Discussed with: Anesthesiologist, CRNA and Surgeon  Anesthesia Plan Comments:         Anesthesia Quick Evaluation

## 2016-02-07 ENCOUNTER — Encounter: Payer: Self-pay | Admitting: Obstetrics and Gynecology

## 2016-02-08 LAB — SURGICAL PATHOLOGY

## 2016-02-11 ENCOUNTER — Encounter: Payer: Self-pay | Admitting: Gynecology

## 2016-02-11 ENCOUNTER — Ambulatory Visit
Admission: EM | Admit: 2016-02-11 | Discharge: 2016-02-11 | Disposition: A | Discharge status: Home / Self Care | Payer: Managed Care, Other (non HMO) | Attending: Family Medicine | Admitting: Family Medicine

## 2016-02-11 DIAGNOSIS — J0100 Acute maxillary sinusitis, unspecified: Secondary | ICD-10-CM | POA: Diagnosis not present

## 2016-02-11 MED ORDER — DOXYCYCLINE HYCLATE 100 MG PO CAPS: 100.0000 mg | ORAL_CAPSULE | Freq: Two times a day (BID) | ORAL | Status: DC

## 2016-02-11 MED ORDER — FLUTICASONE PROPIONATE 50 MCG/ACT NA SUSP: 2.0000 | Freq: Every day | NASAL | Status: DC

## 2016-02-11 NOTE — Discharge Instructions (Signed)
Take medication as prescribed. Rest. Drink plenty of fluids.   Follow up with your primary care physician this week as needed. Return to Urgent care for new or worsening concerns.    Sinusitis, Adult Sinusitis is redness, soreness, and inflammation of the paranasal sinuses. Paranasal sinuses are air pockets within the bones of your face. They are located beneath your eyes, in the middle of your forehead, and above your eyes. In healthy paranasal sinuses, mucus is able to drain out, and air is able to circulate through them by way of your nose. However, when your paranasal sinuses are inflamed, mucus and air can become trapped. This can allow bacteria and other germs to grow and cause infection. Sinusitis can develop quickly and last only a short time (acute) or continue over a long period (chronic). Sinusitis that lasts for more than 12 weeks is considered chronic. CAUSES Causes of sinusitis include:  Allergies.  Structural abnormalities, such as displacement of the cartilage that separates your nostrils (deviated septum), which can decrease the air flow through your nose and sinuses and affect sinus drainage.  Functional abnormalities, such as when the small hairs (cilia) that line your sinuses and help remove mucus do not work properly or are not present. SIGNS AND SYMPTOMS Symptoms of acute and chronic sinusitis are the same. The primary symptoms are pain and pressure around the affected sinuses. Other symptoms include:  Upper toothache.  Earache.  Headache.  Bad breath.  Decreased sense of smell and taste.  A cough, which worsens when you are lying flat.  Fatigue.  Fever.  Thick drainage from your nose, which often is green and may contain pus (purulent).  Swelling and warmth over the affected sinuses. DIAGNOSIS Your health care provider will perform a physical exam. During your exam, your health care provider may perform any of the following to help determine if you have  acute sinusitis or chronic sinusitis:  Look in your nose for signs of abnormal growths in your nostrils (nasal polyps).  Tap over the affected sinus to check for signs of infection.  View the inside of your sinuses using an imaging device that has a light attached (endoscope). If your health care provider suspects that you have chronic sinusitis, one or more of the following tests may be recommended:  Allergy tests.  Nasal culture. A sample of mucus is taken from your nose, sent to a lab, and screened for bacteria.  Nasal cytology. A sample of mucus is taken from your nose and examined by your health care provider to determine if your sinusitis is related to an allergy. TREATMENT Most cases of acute sinusitis are related to a viral infection and will resolve on their own within 10 days. Sometimes, medicines are prescribed to help relieve symptoms of both acute and chronic sinusitis. These may include pain medicines, decongestants, nasal steroid sprays, or saline sprays. However, for sinusitis related to a bacterial infection, your health care provider will prescribe antibiotic medicines. These are medicines that will help kill the bacteria causing the infection. Rarely, sinusitis is caused by a fungal infection. In these cases, your health care provider will prescribe antifungal medicine. For some cases of chronic sinusitis, surgery is needed. Generally, these are cases in which sinusitis recurs more than 3 times per year, despite other treatments. HOME CARE INSTRUCTIONS  Drink plenty of water. Water helps thin the mucus so your sinuses can drain more easily.  Use a humidifier.  Inhale steam 3-4 times a day (for example, sit in  the bathroom with the shower running).  Apply a warm, moist washcloth to your face 3-4 times a day, or as directed by your health care provider.  Use saline nasal sprays to help moisten and clean your sinuses.  Take medicines only as directed by your health care  provider.  If you were prescribed either an antibiotic or antifungal medicine, finish it all even if you start to feel better. SEEK IMMEDIATE MEDICAL CARE IF:  You have increasing pain or severe headaches.  You have nausea, vomiting, or drowsiness.  You have swelling around your face.  You have vision problems.  You have a stiff neck.  You have difficulty breathing.   This information is not intended to replace advice given to you by your health care provider. Make sure you discuss any questions you have with your health care provider.   Document Released: 11/11/2005 Document Revised: 12/02/2014 Document Reviewed: 11/26/2011 Elsevier Interactive Patient Education [employer].

## 2016-02-11 NOTE — ED Notes (Signed)
Patient c/o sinus infection x 2 week. Pt. C/o facial pressure/ head ace.

## 2016-02-11 NOTE — ED Provider Notes (Signed)
[facility]  ____________________________________________  Time seen: Approximately 12:47 PM  I have reviewed the triage vital signs and the nursing notes.   HISTORY  Chief Complaint Sinusitis  HPI DARNICE OSAKI is a 50 y.o. female presents with complaints of 2 weeks of runny nose, nasal congestion, sinus pressure and sinus drainage. Patient reports frequently blowing her nose and a very thick greenish to yellowish nasal drainage out. Reports continues to eat and drink well. Reports has remained active. Denies fevers. States occasional mild cough. States and resolved with over-the-counter cough and congestion medications.  Denies chest pain, shortness of breath, abdominal pain, dysuria, neck pain, back pain, rash, extremity pain, extremity swelling, chest pain with deep breath, weakness or dizziness.  Patient's last menstrual period was 10/23/2015. patient denies chance of pregnancy. Currently following OB/GYN due to abnormal uterine bleeding and states is due to have hysterectomy in few weeks. Patient states that she has not been sexually active in several months.   Past Medical History  Diagnosis Date  . Anxiety   . Heavy menstrual bleeding   . Shortness of breath dyspnea     "due to anemia"    Patient Active Problem List   Diagnosis Date Noted  . Menorrhagia with irregular cycle 02/06/2016  . Endometrial polyp 02/06/2016  . Fibroids 02/06/2016    Past Surgical History  Procedure Laterality Date  . Appendectomy    . Cholecystectomy    . Cesarean section      x2  . Dilatation & curettage/hysteroscopy with myosure N/A 02/06/2016    Procedure: DILATATION & CURETTAGE HYSTEROSCOPY  polypectomy;  Surgeon: Will Bonnet, MD;  Location: [facility] ORS;  Service: Gynecology;  Laterality: N/A;  . Polypectomy N/A 02/06/2016    Procedure: POLYPECTOMY;  Surgeon: Will Bonnet, MD;  Location: [facility] ORS;  Service: Gynecology;  Laterality: N/A;    Current Outpatient Rx  Name   Route  Sig  Dispense  Refill  . acetaminophen (TYLENOL) 500 MG tablet   Oral   Take 2 tablets (1,000 mg total) by mouth every 6 (six) hours as needed for moderate pain or fever.   30 tablet   0   . aspirin 81 MG tablet   Oral   Take 81 mg by mouth every other day. Reported on 02/06/2016         . Ferrous Sulfate (SLOW FE PO)   Oral   Take 1 tablet by mouth daily.         Marland Kitchen ibuprofen (ADVIL,MOTRIN) 600 MG tablet   Oral   Take 1 tablet (600 mg total) by mouth every 6 (six) hours as needed for mild pain or cramping.   30 tablet   0   . medroxyPROGESTERone (PROVERA) 10 MG tablet   Oral   Take 10 mg by mouth 2 (two) times daily.         . traMADol (ULTRAM) 50 MG tablet   Oral   Take 1 tablet (50 mg total) by mouth every 6 (six) hours as needed for moderate pain.   30 tablet   0     Allergies Penicillins and Oxycodone  Family History  Problem Relation Age of Onset  . Hypertension Mother   . Hypertension Father   . Heart failure Father     Social History Social History  Substance Use Topics  . Smoking status: Never Smoker   . Smokeless tobacco: None  . Alcohol Use: No    Review of Systems Constitutional: No fever/chills  Eyes: No visual changes. ENT: No sore throat. Positive runny nose, nasal congestion, sinus pressure and sinus drainage. Cardiovascular: Denies chest pain. Respiratory: Denies shortness of breath. Gastrointestinal: No abdominal pain.  No nausea, no vomiting.  No diarrhea.  No constipation. Genitourinary: Negative for dysuria. Musculoskeletal: Negative for back pain. Skin: Negative for rash. Neurological: Negative for headaches, focal weakness or numbness.  10-point ROS otherwise negative.  ____________________________________________   PHYSICAL EXAM:  VITAL SIGNS: ED Triage Vitals  Enc Vitals Group     BP 02/11/16 1208 143/80 mmHg     Pulse Rate 02/11/16 1208 79     Resp 02/11/16 1208 18     Temp 02/11/16 1208 98.6 F (37 C)      Temp Source 02/11/16 1208 Oral     SpO2 02/11/16 1208 100 %     Weight 02/11/16 1208 184 lb (83.462 kg)     Height 02/11/16 1208 5\' 2"  (1.575 m)     Head Cir --      Peak Flow --      Pain Score 02/11/16 1207 3     Pain Loc --      Pain Edu? --      Excl. in [place]? --   Constitutional: Alert and oriented. Well appearing and in no acute distress. Eyes: Conjunctivae are normal. PERRL. EOMI. Head: Atraumatic.Mild to moderate tenderness to palpation bilateral frontal and maxillary sinuses. No swelling. No erythema.   Ears: no erythema, normal TMs bilaterally.   Nose: nasal congestion with bilateral nasal turbinate erythema and edema.   Mouth/Throat: Mucous membranes are moist.  Oropharynx non-erythematous.No tonsillar swelling or exudate.  Neck: No stridor.  No cervical spine tenderness to palpation. Hematological/Lymphatic/Immunilogical: No cervical lymphadenopathy. Cardiovascular: Normal rate, regular rhythm. Grossly normal heart sounds.  Good peripheral circulation. Respiratory: Normal respiratory effort.  No retractions. Lungs CTAB. No wheezes, rales or rhonchi. Good air movement.  Gastrointestinal: Soft and nontender. No distention. Normal Bowel sounds. No CVA tenderness. Musculoskeletal: No lower or upper extremity tenderness nor edema.  Bilateral pedal pulses equal and easily palpated. No cervical, thoracic or lumbar tenderness to palpation.  Neurologic:  Normal speech and language. No gross focal neurologic deficits are appreciated. No gait instability. Skin:  Skin is warm, dry and intact. No rash noted. Psychiatric: Mood and affect are normal. Speech and behavior are normal.  ____________________________________________   LABS (all labs ordered are listed, but only abnormal results are displayed)  Labs Reviewed - No data to display   INITIAL IMPRESSION / ASSESSMENT AND PLAN / ED COURSE  Pertinent labs & imaging results that were available during my care of the patient were  reviewed by me and considered in my medical decision making (see chart for details).  Very well-appearing patient. No acute distress. Presents with complaints of 2 weeks of runny nose, nasal congestion, sinus pressure, sinus drainage. Lungs clear throughout. Abdomen soft and nontender. Suspect sinusitis. Will treat with oral doxycycline, over-the-counter Tylenol or ibuprofen as needed, continue home Claritin and use Flonase. Counseled regarding photosensitivity with antibiotic. Patient denies any chance of pregnancy. Encourage rest, fluids and PCP follow up.Discussed indication, risks and benefits of medications with patient.   Discussed follow up with Primary care physician this week. Discussed follow up and return parameters including no resolution or any worsening concerns. Patient verbalized understanding and agreed to plan.   ____________________________________________   FINAL CLINICAL IMPRESSION(S) / ED DIAGNOSES  Final diagnoses:  Acute maxillary sinusitis, recurrence not specified      Note:  This dictation was prepared with Dragon dictation along with smaller phrase technology. Any transcriptional errors that result from this process are unintentional.    Marylene Land, NP 02/14/16 1542

## 2016-03-01 ENCOUNTER — Encounter: Payer: Self-pay | Admitting: *Deleted

## 2016-03-01 ENCOUNTER — Ambulatory Visit
Admission: EM | Admit: 2016-03-01 | Discharge: 2016-03-01 | Disposition: A | Discharge status: Home / Self Care | Payer: Managed Care, Other (non HMO) | Attending: Family Medicine | Admitting: Family Medicine

## 2016-03-01 DIAGNOSIS — J0100 Acute maxillary sinusitis, unspecified: Secondary | ICD-10-CM | POA: Diagnosis not present

## 2016-03-01 MED ORDER — AZITHROMYCIN 250 MG PO TABS
ORAL_TABLET | ORAL | Status: DC
Start: 2016-03-01 — End: 2016-03-14

## 2016-03-01 NOTE — ED Provider Notes (Signed)
CSN: [id number]     Arrival date & time 03/01/16  1215 History   First MD Initiated Contact with Patient 03/01/16 1347     Chief Complaint  Patient presents with  . Nasal Congestion  . Facial Pain   (Consider location/radiation/quality/duration/timing/severity/associated sxs/prior Treatment) Patient is a 50 y.o. female presenting with URI. The history is provided by the patient.  URI Presenting symptoms: congestion, cough, facial pain and fatigue   Severity:  Moderate Onset quality:  Sudden Duration:  1 week Timing:  Constant Progression:  Worsening Chronicity:  New Relieved by:  Nothing Associated symptoms: headaches and sinus pain   Risk factors: not elderly, no chronic cardiac disease, no chronic kidney disease, no chronic respiratory disease, no diabetes mellitus, no immunosuppression, no recent illness, no recent travel and no sick contacts     Past Medical History  Diagnosis Date  . Anxiety   . Heavy menstrual bleeding   . Shortness of breath dyspnea     "due to anemia"   Past Surgical History  Procedure Laterality Date  . Appendectomy    . Cholecystectomy    . Cesarean section      x2  . Dilatation & curettage/hysteroscopy with myosure N/A 02/06/2016    Procedure: DILATATION & CURETTAGE HYSTEROSCOPY  polypectomy;  Surgeon: Will Bonnet, MD;  Location: [facility] ORS;  Service: Gynecology;  Laterality: N/A;  . Polypectomy N/A 02/06/2016    Procedure: POLYPECTOMY;  Surgeon: Will Bonnet, MD;  Location: [facility] ORS;  Service: Gynecology;  Laterality: N/A;   Family History  Problem Relation Age of Onset  . Hypertension Mother   . Hypertension Father   . Heart failure Father    Social History  Substance Use Topics  . Smoking status: Never Smoker   . Smokeless tobacco: None  . Alcohol Use: No   OB History    No data available     Review of Systems  Constitutional: Positive for fatigue.  HENT: Positive for congestion.   Respiratory: Positive for cough.    Neurological: Positive for headaches.    Allergies  Penicillins and Oxycodone  Home Medications   Prior to Admission medications   Medication Sig Start Date End Date Taking? Authorizing Provider  cetirizine (ZYRTEC) 10 MG tablet Take 10 mg by mouth daily.   Yes Historical Provider, MD  Ferrous Sulfate (SLOW FE PO) Take 1 tablet by mouth daily.   Yes Historical Provider, MD  medroxyPROGESTERone (PROVERA) 10 MG tablet Take 10 mg by mouth 2 (two) times daily.   Yes Historical Provider, MD  acetaminophen (TYLENOL) 500 MG tablet Take 2 tablets (1,000 mg total) by mouth every 6 (six) hours as needed for moderate pain or fever. 06/26/15   Olen Cordial, NP  aspirin 81 MG tablet Take 81 mg by mouth every other day. Reported on 02/06/2016    Historical Provider, MD  azithromycin (ZITHROMAX Z-PAK) 250 MG tablet 2 tabs po once day 1, then 1 tab po qd for next 4 days 03/01/16   Norval Gable, MD  doxycycline (VIBRAMYCIN) 100 MG capsule Take 1 capsule (100 mg total) by mouth 2 (two) times daily. 02/11/16   Marylene Land, NP  fluticasone (FLONASE) 50 MCG/ACT nasal spray Place 2 sprays into both nostrils daily. 02/11/16 02/21/16  Marylene Land, NP  ibuprofen (ADVIL,MOTRIN) 600 MG tablet Take 1 tablet (600 mg total) by mouth every 6 (six) hours as needed for mild pain or cramping. 02/06/16   Will Bonnet, MD  traMADol Veatrice Bourbon) 50  MG tablet Take 1 tablet (50 mg total) by mouth every 6 (six) hours as needed for moderate pain. 02/06/16   Will Bonnet, MD   Meds Ordered and Administered this Visit  Medications - No data to display  BP 147/87 mmHg  Pulse 79  Temp(Src) 98.3 F (36.8 C) (Oral)  Resp 16  Ht 5\' 2"  (1.575 m)  Wt 185 lb (83.915 kg)  BMI 33.83 kg/m2  SpO2 100%  LMP  (Approximate) No data found.   Physical Exam  Constitutional: She appears well-developed and well-nourished. No distress.  HENT:  Head: Normocephalic and atraumatic.  Right Ear: Tympanic membrane, external ear and  ear canal normal.  Left Ear: Tympanic membrane, external ear and ear canal normal.  Nose: Mucosal edema and rhinorrhea present. No nose lacerations, sinus tenderness, nasal deformity, septal deviation or nasal septal hematoma. No epistaxis.  No foreign bodies. Right sinus exhibits maxillary sinus tenderness and frontal sinus tenderness. Left sinus exhibits maxillary sinus tenderness and frontal sinus tenderness.  Mouth/Throat: Uvula is midline, oropharynx is clear and moist and mucous membranes are normal. No oropharyngeal exudate.  Eyes: Conjunctivae and EOM are normal. Pupils are equal, round, and reactive to light. Right eye exhibits no discharge. Left eye exhibits no discharge. No scleral icterus.  Neck: Normal range of motion. Neck supple. No thyromegaly present.  Cardiovascular: Normal rate, regular rhythm and normal heart sounds.   Pulmonary/Chest: Effort normal and breath sounds normal. No respiratory distress. She has no wheezes. She has no rales.  Lymphadenopathy:    She has no cervical adenopathy.  Skin: She is not diaphoretic.  Nursing note and vitals reviewed.   ED Course  Procedures (including critical care time)  Labs Review Labs Reviewed - No data to display  Imaging Review No results found.   Visual Acuity Review  Right Eye Distance:   Left Eye Distance:   Bilateral Distance:    Right Eye Near:   Left Eye Near:    Bilateral Near:         MDM   1. Acute maxillary sinusitis, recurrence not specified    New Prescriptions   AZITHROMYCIN (ZITHROMAX Z-PAK) 250 MG TABLET    2 tabs po once day 1, then 1 tab po qd for next 4 days   1. diagnosis reviewed with patient 2. rx as per orders above; reviewed possible side effects, interactions, risks and benefits  3. Recommend supportive treatment with otc analgesics prn, otc flonase 4. Follow-up prn if symptoms worsen or don't improve    Norval Gable, MD 03/01/16 1420

## 2016-03-01 NOTE — ED Notes (Signed)
Seen here approx 1 month ago for sinusitis. States took doxycycline as rx but problem not fully resolved. Scheduled for surgery on the 18th and concerned it may be cancelled if she has a URI.

## 2016-03-04 ENCOUNTER — Encounter
Admission: RE | Admit: 2016-03-04 | Discharge: 2016-03-04 | Disposition: A | Discharge status: Home / Self Care | Payer: Managed Care, Other (non HMO) | Source: Ambulatory Visit | Attending: Obstetrics and Gynecology | Admitting: Obstetrics and Gynecology

## 2016-03-04 DIAGNOSIS — Z01812 Encounter for preprocedural laboratory examination: Secondary | ICD-10-CM | POA: Diagnosis not present

## 2016-03-04 HISTORY — DX: Other seasonal allergic rhinitis: J30.2

## 2016-03-04 HISTORY — DX: Anemia, unspecified: D64.9

## 2016-03-04 LAB — COMPREHENSIVE METABOLIC PANEL
ALK PHOS: 77 U/L (ref 38–126)
ALT: 25 U/L (ref 14–54)
AST: 24 U/L (ref 15–41)
Albumin: 4.1 g/dL (ref 3.5–5.0)
Anion gap: 6 (ref 5–15)
BILIRUBIN TOTAL: 0.6 mg/dL (ref 0.3–1.2)
BUN: 9 mg/dL (ref 6–20)
CO2: 22 mmol/L (ref 22–32)
CREATININE: 0.65 mg/dL (ref 0.44–1.00)
Calcium: 9.4 mg/dL (ref 8.9–10.3)
Chloride: 109 mmol/L (ref 101–111)
GFR calc Af Amer: >60 mL/min (ref >60)
GLUCOSE: 93 mg/dL (ref 65–99)
Potassium: 3.7 mmol/L (ref 3.5–5.1)
Sodium: 137 mmol/L (ref 135–145)
TOTAL PROTEIN: 7.8 g/dL (ref 6.5–8.1)

## 2016-03-04 LAB — CBC
HEMATOCRIT: 41.3 % (ref 35.0–47.0)
HEMOGLOBIN: 13.4 g/dL (ref 12.0–16.0)
MCH: 28.1 pg (ref 26.0–34.0)
MCHC: 32.5 g/dL (ref 32.0–36.0)
MCV: 86.4 fL (ref 80.0–100.0)
Platelets: 519 10*3/uL — ABNORMAL HIGH (ref 150–440)
RBC: 4.78 MIL/uL (ref 3.80–5.20)
RDW: 17.6 % — ABNORMAL HIGH (ref 11.5–14.5)
WBC: 7.9 10*3/uL (ref 3.6–11.0)

## 2016-03-04 LAB — TYPE AND SCREEN
ABO/RH(D): O POS
Antibody Screen: NEGATIVE

## 2016-03-04 NOTE — Pre-Procedure Instructions (Addendum)
Unable to find stress test results, spoke with Elberta Fortis in Dr. Bethanne Ginger office requesting Stress test results faxed to PAT.  Pt has had surgery for D+C polypectomy on 02/06/16 here at [facility] and did well.

## 2016-03-04 NOTE — Pre-Procedure Instructions (Addendum)
Sydnee Levans, MD - 02/02/2016 2:45 PM EST  History of Present Illness: Ms. Seefeldt is a 50 y.o.female patient who presents after being noted to have an abnormal electrocardiogram in a preop exam. Patient has some fatigue with exertion but has been bleeding vaginally and is scheduled for surgery. She has some mild shortness of breath with activity but denies chest pain syncope or presyncope. She denies orthopnea or PND. EKG at baseline revealed sinus rhythm at a rate of 70 with a PR interval of 158 ms, QRS duration is 70 ms with a QTC of 408 ms with QRS axis of 16. There is a delayed R-wave progression across precordium consistent with lead placement versus septal infarct.  EKG EKG demonstrated normal sinus rhythm, nonspecific ST and T waves changes.  50 y.o. female with  ICD-10-CM ICD-9-CM  1. Abnormal ECG-we will proceed with an ETT to risk stratify. If there is no significant abnormalities would proceed with surgery routine cardiac monitoring R94.31 794.31 CARD stress test only, exercise   Return if symptoms worsen or fail to improve.  These notes generated with voice recognition software. I apologize for typographical errors.  Sydnee Levans, MD

## 2016-03-04 NOTE — Patient Instructions (Signed)
  Your procedure is scheduled on: Tuesday April 18 , 2017. Report to Same Day Surgery. To find out your arrival time please call [phone] between 1PM - 3PM on Monday March 11, 2016.  Remember: Instructions that are not followed completely may result in serious medical risk, up to and including death, or upon the discretion of your surgeon and anesthesiologist your surgery may need to be rescheduled.    _x___ 1. Do not eat food or drink liquids after midnight. No gum chewing or hard candies.     ____ 2. No Alcohol for 24 hours before or after surgery.   ____ 3. Bring all medications with you on the day of surgery if instructed.    __x__ 4. Notify your doctor if there is any change in your medical condition     (cold, fever, infections).     Do not wear jewelry, make-up, hairpins, clips or nail polish.  Do not wear lotions, powders, or perfumes. You may wear deodorant.  Do not shave 48 hours prior to surgery. Men may shave face and neck.  Do not bring valuables to the hospital.    [facility] is not responsible for any belongings or valuables.               Contacts, dentures or bridgework may not be worn into surgery.  Leave your suitcase in the car. After surgery it may be brought to your room.  For patients admitted to the hospital, discharge time is determined by your treatment team.   Patients discharged the day of surgery will not be allowed to drive home.    Please read over the following fact sheets that you were given:   [facility] Preparing for Surgery  __x__ Take these medicines the morning of surgery with A SIP OF WATER: none    ____ Fleet Enema (as directed)   _x___ Use CHG Soap as directed on instruction sheet  ____ Use inhalers on the day of surgery and bring to hospital day of surgery  ____ Stop metformin 2 days prior to surgery    ____ Take 1/2 of usual insulin dose the night before surgery and none on the morning of  surgery.   ____ Stop  Coumadin/Plavix/aspirin on does not apply.  _x___ Stop Anti-inflammatories such as Advil, Aleve, Ibuprofen, Motrin, Naproxen, Naprosyn, Goodies powders or aspirin products. Tylenol OK to take.   ____ Stop supplements until after surgery.    ____ Bring C-Pap to the hospital.

## 2016-03-04 NOTE — Pre-Procedure Instructions (Signed)
Pt has been on 2 different antibiotics for a sinus infection.  Last does of Z pack is tomorrow.  Pt reports she is feeling better, no longer blowing out colored mucous from nose.

## 2016-03-06 NOTE — Pre-Procedure Instructions (Signed)
See notes from Dr. Bethanne Ginger Stress Test results and office notes on chart.  Stress test results normal, notes If there is no significant abnormalities would proceed with surgery routine cardiac monitoring.

## 2016-03-12 ENCOUNTER — Encounter: Payer: Self-pay | Admitting: *Deleted

## 2016-03-12 ENCOUNTER — Encounter
Admission: AD | Disposition: A | Discharge status: Home / Self Care | Payer: Self-pay | Source: Ambulatory Visit | Attending: Obstetrics and Gynecology

## 2016-03-12 ENCOUNTER — Inpatient Hospital Stay
Admission: AD | Admit: 2016-03-12 | Discharge: 2016-03-14 | DRG: 743 | Disposition: A | Discharge status: Home / Self Care | Payer: Managed Care, Other (non HMO) | Source: Ambulatory Visit | Attending: Obstetrics and Gynecology | Admitting: Obstetrics and Gynecology

## 2016-03-12 ENCOUNTER — Ambulatory Visit: Payer: Managed Care, Other (non HMO) | Admitting: Anesthesiology

## 2016-03-12 DIAGNOSIS — Z90710 Acquired absence of both cervix and uterus: Secondary | ICD-10-CM | POA: Diagnosis not present

## 2016-03-12 DIAGNOSIS — Z8249 Family history of ischemic heart disease and other diseases of the circulatory system: Secondary | ICD-10-CM | POA: Diagnosis not present

## 2016-03-12 DIAGNOSIS — N856 Intrauterine synechiae: Secondary | ICD-10-CM | POA: Diagnosis present

## 2016-03-12 DIAGNOSIS — D251 Intramural leiomyoma of uterus: Principal | ICD-10-CM | POA: Diagnosis present

## 2016-03-12 DIAGNOSIS — D259 Leiomyoma of uterus, unspecified: Secondary | ICD-10-CM | POA: Diagnosis present

## 2016-03-12 DIAGNOSIS — Z880 Allergy status to penicillin: Secondary | ICD-10-CM

## 2016-03-12 DIAGNOSIS — D649 Anemia, unspecified: Secondary | ICD-10-CM | POA: Diagnosis not present

## 2016-03-12 DIAGNOSIS — Z5331 Laparoscopic surgical procedure converted to open procedure: Secondary | ICD-10-CM | POA: Diagnosis not present

## 2016-03-12 DIAGNOSIS — N921 Excessive and frequent menstruation with irregular cycle: Secondary | ICD-10-CM | POA: Diagnosis present

## 2016-03-12 HISTORY — PX: ROBOTIC ASSISTED TOTAL HYSTERECTOMY WITH SALPINGECTOMY: SHX6679

## 2016-03-12 HISTORY — PX: CYSTOSCOPY: SHX5120

## 2016-03-12 LAB — CBC
HEMATOCRIT: 35.9 % (ref 35.0–47.0)
HEMOGLOBIN: 11.6 g/dL — AB (ref 12.0–16.0)
MCH: 28.2 pg (ref 26.0–34.0)
MCHC: 32.3 g/dL (ref 32.0–36.0)
MCV: 87.1 fL (ref 80.0–100.0)
Platelets: 406 10*3/uL (ref 150–440)
RBC: 4.12 MIL/uL (ref 3.80–5.20)
RDW: 16.3 % — ABNORMAL HIGH (ref 11.5–14.5)
WBC: 19.2 10*3/uL — AB (ref 3.6–11.0)

## 2016-03-12 LAB — POCT PREGNANCY, URINE: Preg Test, Ur: NEGATIVE

## 2016-03-12 SURGERY — ROBOTIC ASSISTED TOTAL HYSTERECTOMY WITH SALPINGECTOMY
Anesthesia: General

## 2016-03-12 MED ORDER — CEFAZOLIN SODIUM-DEXTROSE 2-4 GM/100ML-% IV SOLN
2.0000 g | INTRAVENOUS | Status: AC
  Administered 2016-03-12 (×2): 2 g via INTRAVENOUS

## 2016-03-12 MED ORDER — ONDANSETRON HCL 4 MG/2ML IJ SOLN
4.0000 mg | Freq: Four times a day (QID) | INTRAMUSCULAR | Status: DC | PRN
  Administered 2016-03-12 – 2016-03-13 (×2): 4 mg via INTRAVENOUS
  Filled 2016-03-12 (×3): qty 2

## 2016-03-12 MED ORDER — GLYCOPYRROLATE 0.2 MG/ML IJ SOLN
INTRAMUSCULAR | Status: DC | PRN
  Administered 2016-03-12: .6 mg via INTRAVENOUS

## 2016-03-12 MED ORDER — PROPOFOL 10 MG/ML IV BOLUS
INTRAVENOUS | Status: DC | PRN
  Administered 2016-03-12: 120 mg via INTRAVENOUS
  Administered 2016-03-12: 50 mg via INTRAVENOUS
  Administered 2016-03-12: 20 mg via INTRAVENOUS

## 2016-03-12 MED ORDER — FENTANYL CITRATE (PF) 100 MCG/2ML IJ SOLN
INTRAMUSCULAR | Status: AC
  Filled 2016-03-12: qty 2

## 2016-03-12 MED ORDER — EPHEDRINE SULFATE 50 MG/ML IJ SOLN
INTRAMUSCULAR | Status: DC | PRN
  Administered 2016-03-12: 10 mg via INTRAVENOUS

## 2016-03-12 MED ORDER — MIDAZOLAM HCL 2 MG/2ML IJ SOLN
INTRAMUSCULAR | Status: DC | PRN
  Administered 2016-03-12: 2 mg via INTRAVENOUS

## 2016-03-12 MED ORDER — IBUPROFEN 600 MG PO TABS
600.0000 mg | ORAL_TABLET | Freq: Four times a day (QID) | ORAL | Status: DC | PRN
  Administered 2016-03-12 – 2016-03-13 (×4): 600 mg via ORAL
  Filled 2016-03-12 (×4): qty 1

## 2016-03-12 MED ORDER — DEXAMETHASONE SODIUM PHOSPHATE 10 MG/ML IJ SOLN
INTRAMUSCULAR | Status: DC | PRN
  Administered 2016-03-12: 8 mg via INTRAVENOUS

## 2016-03-12 MED ORDER — HYDROCODONE-ACETAMINOPHEN 5-325 MG PO TABS: 1.0000 | ORAL_TABLET | ORAL | Status: DC | PRN

## 2016-03-12 MED ORDER — LACTATED RINGERS IV SOLN
INTRAVENOUS | Status: DC
  Administered 2016-03-12 – 2016-03-13 (×2): via INTRAVENOUS

## 2016-03-12 MED ORDER — ROCURONIUM BROMIDE 100 MG/10ML IV SOLN
INTRAVENOUS | Status: DC | PRN
  Administered 2016-03-12 (×5): 10 mg via INTRAVENOUS
  Administered 2016-03-12: 30 mg via INTRAVENOUS
  Administered 2016-03-12: 20 mg via INTRAVENOUS

## 2016-03-12 MED ORDER — HYDROMORPHONE HCL 1 MG/ML IJ SOLN
1.0000 mg | INTRAMUSCULAR | Status: DC | PRN
  Administered 2016-03-12 – 2016-03-13 (×3): 1 mg via INTRAVENOUS
  Filled 2016-03-12 (×3): qty 1

## 2016-03-12 MED ORDER — ONDANSETRON HCL 4 MG PO TABS
4.0000 mg | ORAL_TABLET | Freq: Four times a day (QID) | ORAL | Status: DC | PRN
  Administered 2016-03-13: 4 mg via ORAL
  Filled 2016-03-12: qty 1

## 2016-03-12 MED ORDER — BUPIVACAINE HCL (PF) 0.5 % IJ SOLN
INTRAMUSCULAR | Status: AC
  Filled 2016-03-12: qty 30

## 2016-03-12 MED ORDER — LABETALOL HCL 5 MG/ML IV SOLN
INTRAVENOUS | Status: DC | PRN
  Administered 2016-03-12: 10 mg via INTRAVENOUS
  Administered 2016-03-12 (×2): 5 mg via INTRAVENOUS

## 2016-03-12 MED ORDER — DOCUSATE SODIUM 100 MG PO CAPS
100.0000 mg | ORAL_CAPSULE | Freq: Two times a day (BID) | ORAL | Status: DC
  Administered 2016-03-13 – 2016-03-14 (×3): 100 mg via ORAL
  Filled 2016-03-12 (×3): qty 1

## 2016-03-12 MED ORDER — CEFAZOLIN SODIUM-DEXTROSE 2-4 GM/100ML-% IV SOLN
INTRAVENOUS | Status: AC
  Filled 2016-03-12: qty 100

## 2016-03-12 MED ORDER — HYDROCODONE-ACETAMINOPHEN 5-325 MG PO TABS
2.0000 | ORAL_TABLET | ORAL | Status: DC | PRN
  Administered 2016-03-13 – 2016-03-14 (×8): 2 via ORAL
  Filled 2016-03-12 (×8): qty 2

## 2016-03-12 MED ORDER — FAMOTIDINE 20 MG PO TABS
20.0000 mg | ORAL_TABLET | Freq: Once | ORAL | Status: AC
  Administered 2016-03-12: 20 mg via ORAL

## 2016-03-12 MED ORDER — FENTANYL CITRATE (PF) 100 MCG/2ML IJ SOLN
25.0000 ug | INTRAMUSCULAR | Status: AC | PRN
  Administered 2016-03-12 (×6): 25 ug via INTRAVENOUS

## 2016-03-12 MED ORDER — LACTATED RINGERS IV SOLN
INTRAVENOUS | Status: DC
  Administered 2016-03-12 (×2): via INTRAVENOUS

## 2016-03-12 MED ORDER — SCOPOLAMINE 1 MG/3DAYS TD PT72
1.0000 | MEDICATED_PATCH | TRANSDERMAL | Status: DC
  Administered 2016-03-12: 1.5 mg via TRANSDERMAL

## 2016-03-12 MED ORDER — MENTHOL 3 MG MT LOZG
1.0000 | LOZENGE | OROMUCOSAL | Status: DC | PRN
  Filled 2016-03-12: qty 9

## 2016-03-12 MED ORDER — LIDOCAINE HCL (CARDIAC) 20 MG/ML IV SOLN
INTRAVENOUS | Status: DC | PRN
  Administered 2016-03-12: 40 mg via INTRAVENOUS

## 2016-03-12 MED ORDER — SIMETHICONE 80 MG PO CHEW
80.0000 mg | CHEWABLE_TABLET | Freq: Four times a day (QID) | ORAL | Status: DC | PRN
  Administered 2016-03-13 (×2): 80 mg via ORAL
  Filled 2016-03-12 (×2): qty 1

## 2016-03-12 MED ORDER — FAMOTIDINE 20 MG PO TABS
ORAL_TABLET | ORAL | Status: AC
  Filled 2016-03-12: qty 1

## 2016-03-12 MED ORDER — SCOPOLAMINE 1 MG/3DAYS TD PT72
MEDICATED_PATCH | TRANSDERMAL | Status: AC
  Filled 2016-03-12: qty 1

## 2016-03-12 MED ORDER — FENTANYL CITRATE (PF) 100 MCG/2ML IJ SOLN
INTRAMUSCULAR | Status: DC | PRN
  Administered 2016-03-12: 100 ug via INTRAVENOUS
  Administered 2016-03-12: 50 ug via INTRAVENOUS
  Administered 2016-03-12: 100 ug via INTRAVENOUS
  Administered 2016-03-12: 150 ug via INTRAVENOUS
  Administered 2016-03-12 (×2): 50 ug via INTRAVENOUS

## 2016-03-12 MED ORDER — ONDANSETRON HCL 4 MG/2ML IJ SOLN
4.0000 mg | Freq: Once | INTRAMUSCULAR | Status: AC | PRN
  Administered 2016-03-12: 4 mg via INTRAVENOUS

## 2016-03-12 MED ORDER — NEOSTIGMINE METHYLSULFATE 10 MG/10ML IV SOLN
INTRAVENOUS | Status: DC | PRN
  Administered 2016-03-12: 4 mg via INTRAVENOUS

## 2016-03-12 SURGICAL SUPPLY — 81 items
BAG URO DRAIN 2000ML W/SPOUT (MISCELLANEOUS) ×3 IMPLANT
BLADE SURG SZ10 CARB STEEL (BLADE) ×3 IMPLANT
BLADE SURG SZ11 CARB STEEL (BLADE) ×3 IMPLANT
CANISTER SUCT 1200ML W/VALVE (MISCELLANEOUS) ×3 IMPLANT
CANNULA SEALS 8.5MM (CANNULA) ×1
CATH FOLEY 2WAY  5CC 16FR (CATHETERS) ×1
CATH ROBINSON RED A/P 16FR (CATHETERS) ×3 IMPLANT
CATH URTH 16FR FL 2W BLN LF (CATHETERS) ×2 IMPLANT
CHLORAPREP W/TINT 26ML (MISCELLANEOUS) ×6 IMPLANT
CORD BIP STRL DISP 12FT (MISCELLANEOUS) ×3 IMPLANT
COUNTER NEEDLE 20/40 LG (NEEDLE) ×3 IMPLANT
COVER MAYO STAND STRL (DRAPES) ×3 IMPLANT
COVER TIP SHEARS 8 DVNC (MISCELLANEOUS) ×2 IMPLANT
COVER TIP SHEARS 8MM DA VINCI (MISCELLANEOUS) ×1
CRADLE LAMINECT ARM (MISCELLANEOUS) ×3 IMPLANT
DEFOGGER SCOPE WARMER CLEARIFY (MISCELLANEOUS) ×3 IMPLANT
DRAPE 3 ARM ACCESS DA VINCI (DRAPES) ×1
DRAPE 3 ARM ACCESS DVNC (DRAPES) ×2 IMPLANT
DRAPE LEGGINS SURG 28X43 STRL (DRAPES) ×3 IMPLANT
DRAPE SHEET LG 3/4 BI-LAMINATE (DRAPES) ×6 IMPLANT
DRAPE TABLE BACK 80X90 (DRAPES) ×3 IMPLANT
DRAPE UNDER BUTTOCK W/FLU (DRAPES) ×3 IMPLANT
ELECT BLADE 6.5 EXT (BLADE) ×3 IMPLANT
ELECT REM PT RETURN 9FT ADLT (ELECTROSURGICAL) ×3
ELECTRODE REM PT RTRN 9FT ADLT (ELECTROSURGICAL) ×2 IMPLANT
FILTER LAP SMOKE EVAC STRL (MISCELLANEOUS) ×3 IMPLANT
GLOVE BIO SURGEON STRL SZ7 (GLOVE) ×21 IMPLANT
GLOVE BIOGEL PI IND STRL 7.5 (GLOVE) ×14 IMPLANT
GLOVE BIOGEL PI INDICATOR 7.5 (GLOVE) ×7
GOWN STRL REUS W/ TWL LRG LVL3 (GOWN DISPOSABLE) ×12 IMPLANT
GOWN STRL REUS W/ TWL XL LVL3 (GOWN DISPOSABLE) ×2 IMPLANT
GOWN STRL REUS W/TWL LRG LVL3 (GOWN DISPOSABLE) ×6
GOWN STRL REUS W/TWL XL LVL3 (GOWN DISPOSABLE) ×1
GRASPER SUT TROCAR 14GX15 (MISCELLANEOUS) ×6 IMPLANT
HEMOSTAT ARISTA ABSORB 3G PWDR (MISCELLANEOUS) ×3 IMPLANT
IRRIGATION STRYKERFLOW (MISCELLANEOUS) ×2 IMPLANT
IRRIGATOR STRYKERFLOW (MISCELLANEOUS) ×3
IV NS 1000ML (IV SOLUTION) ×2
IV NS 1000ML BAXH (IV SOLUTION) ×4 IMPLANT
KIT RM TURNOVER CYSTO AR (KITS) ×3 IMPLANT
LABEL OR SOLS (LABEL) ×3 IMPLANT
LIQUID BAND (GAUZE/BANDAGES/DRESSINGS) ×3 IMPLANT
MANIPULATOR VCARE LG CRV RETR (MISCELLANEOUS) IMPLANT
MANIPULATOR VCARE STD CRV RETR (MISCELLANEOUS) ×3 IMPLANT
NDL SAFETY 22GX1.5 (NEEDLE) ×3 IMPLANT
NS IRRIG 1000ML POUR BTL (IV SOLUTION) ×6 IMPLANT
OCCLUDER COLPOPNEUMO (BALLOONS) ×3 IMPLANT
PACK LAP CHOLECYSTECTOMY (MISCELLANEOUS) ×3 IMPLANT
PAD OB MATERNITY 4.3X12.25 (PERSONAL CARE ITEMS) ×3 IMPLANT
PAD PREP 24X41 OB/GYN DISP (PERSONAL CARE ITEMS) ×3 IMPLANT
PAD TRENDELENBURG OR TABLE (MISCELLANEOUS) ×3 IMPLANT
SCISSORS METZENBAUM CVD 33 (INSTRUMENTS) ×3 IMPLANT
SEAL CANN 8.5 DVNC (CANNULA) ×2 IMPLANT
SET CYSTO W/LG BORE CLAMP LF (SET/KITS/TRAYS/PACK) ×6 IMPLANT
SOLUTION ELECTROLUBE (MISCELLANEOUS) ×3 IMPLANT
SPONGE LAP 18X18 5 PK (GAUZE/BANDAGES/DRESSINGS) ×3 IMPLANT
SPONGE XRAY 4X4 16PLY STRL (MISCELLANEOUS) ×3 IMPLANT
STAPLER SKIN PROX 35W (STAPLE) ×3 IMPLANT
SURGILUBE 2OZ TUBE FLIPTOP (MISCELLANEOUS) ×3 IMPLANT
SUT DVC VLOC 180 0 12IN GS21 (SUTURE) ×3
SUT PDS AB 1 TP1 96 (SUTURE) ×3 IMPLANT
SUT PLAIN 2 0 XLH (SUTURE) ×3 IMPLANT
SUT VIC AB 0 CT1 27 (SUTURE) ×4
SUT VIC AB 0 CT1 27XCR 8 STRN (SUTURE) ×8 IMPLANT
SUT VIC AB 0 CT2 27 (SUTURE) ×3 IMPLANT
SUT VIC AB 1 CT1 36 (SUTURE) ×6 IMPLANT
SUT VIC AB 2-0 CT1 27 (SUTURE) ×1
SUT VIC AB 2-0 CT1 TAPERPNT 27 (SUTURE) ×2 IMPLANT
SUT VIC AB 4-0 SH 27 (SUTURE) ×2
SUT VIC AB 4-0 SH 27XANBCTRL (SUTURE) ×4 IMPLANT
SUT VICRYL 0 AB UR-6 (SUTURE) ×3 IMPLANT
SUTURE DVC VLC 180 0 12IN GS21 (SUTURE) ×2 IMPLANT
SYR 50ML LL SCALE MARK (SYRINGE) ×3 IMPLANT
SYRINGE 10CC LL (SYRINGE) ×3 IMPLANT
TROCAR 12M 150ML BLUNT (TROCAR) ×3 IMPLANT
TROCAR DISP BLADELESS 8 DVNC (TROCAR) ×2 IMPLANT
TROCAR DISP BLADELESS 8MM (TROCAR) ×1
TROCAR ENDO BLADELESS 11MM (ENDOMECHANICALS) ×3 IMPLANT
TROCAR XCEL 12X100 BLDLESS (ENDOMECHANICALS) ×6 IMPLANT
TROCAR XCEL NON-BLD 5MMX100MML (ENDOMECHANICALS) ×6 IMPLANT
TUBING INSUFFLATOR HEATED (MISCELLANEOUS) ×3 IMPLANT

## 2016-03-12 NOTE — Anesthesia Preprocedure Evaluation (Addendum)
Anesthesia Evaluation  Patient identified by MRN, date of birth, ID band Patient awake    Reviewed: Allergy & Precautions, H&P , NPO status , Patient's Chart, lab work & pertinent test results  History of Anesthesia Complications Negative for: history of anesthetic complications  Airway Mallampati: III  TM Distance: >3 FB Neck ROM: limited    Dental  (+) Poor Dentition   Pulmonary shortness of breath and with exertion,    Pulmonary exam normal breath sounds clear to auscultation       Cardiovascular Exercise Tolerance: Good (-) angina+ DOE  (-) Past MI negative cardio ROS Normal cardiovascular exam Rhythm:regular Rate:Normal     Neuro/Psych PSYCHIATRIC DISORDERS Anxiety negative neurological ROS     GI/Hepatic negative GI ROS, Neg liver ROS,   Endo/Other  negative endocrine ROS  Renal/GU negative Renal ROS  Female GU complaint  negative genitourinary   Musculoskeletal negative musculoskeletal ROS (+)   Abdominal   Peds negative pediatric ROS (+)  Hematology  (+) Blood dyscrasia, anemia ,   Anesthesia Other Findings Menorrhagia, endometrial polyp and uterine fibroids  Reproductive/Obstetrics negative OB ROS                             Anesthesia Physical  Anesthesia Plan  ASA: II  Anesthesia Plan: General   Post-op Pain Management:    Induction:   Airway Management Planned: Oral ETT  Additional Equipment:   Intra-op Plan:   Post-operative Plan: Extubation in OR  Informed Consent: I have reviewed the patients History and Physical, chart, labs and discussed the procedure including the risks, benefits and alternatives for the proposed anesthesia with the patient or authorized representative who has indicated his/her understanding and acceptance.   Dental Advisory Given and Dental advisory given  Plan Discussed with: Anesthesiologist, CRNA and Surgeon  Anesthesia Plan  Comments:         Anesthesia Quick Evaluation

## 2016-03-12 NOTE — Anesthesia Procedure Notes (Signed)
Procedure Name: Intubation Date/Time: 03/12/2016 10:00 AM Performed by: Allean Found Pre-anesthesia Checklist: Patient identified, Emergency Drugs available, Suction available, Patient being monitored and Timeout performed Patient Re-evaluated:Patient Re-evaluated prior to inductionOxygen Delivery Method: Circle system utilized Preoxygenation: Pre-oxygenation with 100% oxygen Intubation Type: IV induction Ventilation: Mask ventilation without difficulty Laryngoscope Size: Mac and 3 Grade View: Grade II Tube type: Oral Tube size: 7.0 mm Number of attempts: 1 Airway Equipment and Method: Stylet Placement Confirmation: ETT inserted through vocal cords under direct vision,  positive ETCO2 and breath sounds checked- equal and bilateral Secured at: 21 cm Tube secured with: Tape Dental Injury: Teeth and Oropharynx as per pre-operative assessment  Difficulty Due To: Difficult Airway- due to anterior larynx

## 2016-03-12 NOTE — H&P (Signed)
History and Physical Interval Note:  Robin Kelly  has presented today for surgery, with the diagnosis of [place]  The various methods of treatment have been discussed with the patient and family. After consideration of risks, benefits and other options for treatment, the patient has consented to  Procedure(s): ROBOTIC ASSISTED TOTAL HYSTERECTOMY WITH SALPINGECTOMY (Bilateral) CYSTOSCOPY (N/A) as a surgical intervention .  The patient's history has been reviewed, patient examined, no change in status, stable for surgery.  I have reviewed the patient's chart and labs.  Questions were answered to the patient's satisfaction.    Will Bonnet, MD 03/12/2016 9:30 AM

## 2016-03-12 NOTE — Brief Op Note (Signed)
03/12/2016  3:19 PM  PATIENT:  Robin Kelly  50 y.o. female  PRE-OPERATIVE DIAGNOSIS:  MENORRHAGIA WITH IRREGULAR CYCLE,FIBROID UTERUS  POST-OPERATIVE DIAGNOSIS:  MENORRHAGIA WITH IRREGULAR CYCLE,FIBROID UTERUS  PROCEDURE:   Total abdominal hysterectomy, left salpingectomy, cystoscopy (converted from laparoscopic hysterectomy)  SURGEON:  Surgeon(s) and Role:    * Will Bonnet, MD - Primary    * Honor Loh Ward, MD - Assisting  ANESTHESIA:   general  EBL:  Total I/O In: 3000 [I.V.:3000] Out: 1500 [Urine:700; Blood:800]  BLOOD ADMINISTERED:none  DRAINS: Urinary Catheter (Foley)   LOCAL MEDICATIONS USED:  NONE  SPECIMEN:  Source of Specimen:  Uterus, cervix, left fallopian tube  DISPOSITION OF SPECIMEN:  PATHOLOGY  COUNTS:  YES  TOURNIQUET:  * No tourniquets in log *  DICTATION: .Note written in EPIC  PLAN OF CARE: Admit to inpatient   PATIENT DISPOSITION:  PACU - hemodynamically stable.   Delay start of Pharmacological VTE agent (>24hrs) due to surgical blood loss or risk of bleeding: n/a  Prentice Docker, MD 03/12/2016 3:21 PM

## 2016-03-12 NOTE — Transfer of Care (Signed)
Immediate Anesthesia Transfer of Care Note  Patient: Robin Kelly  Procedure(s) Performed: Procedure(s): ROBOTIC ASSISTED TOTAL HYSTERECTOMY  (Bilateral) CYSTOSCOPY (N/A) HYSTERECTOMY ABDOMINAL WITH LEFT SALPINGECTOMY (Bilateral)  Patient Location: PACU  Anesthesia Type:General  Level of Consciousness: sedated  Airway & Oxygen Therapy: Patient Spontanous Breathing and Patient connected to face mask oxygen  Post-op Assessment: Report given to RN and Post -op Vital signs reviewed and stable  Post vital signs: Reviewed and stable  Last Vitals:  Filed Vitals:   03/12/16 0850  BP: 153/79  Pulse: 83  Temp: 36.9 C  Resp: 18    Complications: No apparent anesthesia complications

## 2016-03-13 ENCOUNTER — Encounter: Payer: Self-pay | Admitting: Obstetrics and Gynecology

## 2016-03-13 LAB — BASIC METABOLIC PANEL
ANION GAP: 6 (ref 5–15)
BUN: 8 mg/dL (ref 6–20)
CHLORIDE: 105 mmol/L (ref 101–111)
CO2: 23 mmol/L (ref 22–32)
Calcium: 8.3 mg/dL — ABNORMAL LOW (ref 8.9–10.3)
Creatinine, Ser: 0.8 mg/dL (ref 0.44–1.00)
GFR calc non Af Amer: >60 mL/min (ref >60)
Glucose, Bld: 115 mg/dL — ABNORMAL HIGH (ref 65–99)
POTASSIUM: 3.9 mmol/L (ref 3.5–5.1)
SODIUM: 134 mmol/L — AB (ref 135–145)

## 2016-03-13 LAB — CBC
HEMATOCRIT: 31.3 % — AB (ref 35.0–47.0)
HEMOGLOBIN: 10.3 g/dL — AB (ref 12.0–16.0)
MCH: 28.1 pg (ref 26.0–34.0)
MCHC: 32.9 g/dL (ref 32.0–36.0)
MCV: 85.7 fL (ref 80.0–100.0)
Platelets: 380 10*3/uL (ref 150–440)
RBC: 3.66 MIL/uL — AB (ref 3.80–5.20)
RDW: 16.3 % — ABNORMAL HIGH (ref 11.5–14.5)
WBC: 13.9 10*3/uL — AB (ref 3.6–11.0)

## 2016-03-13 NOTE — Progress Notes (Signed)
Patient ID: Robin Kelly, female   DOB: 1966/10/07, 50 y.o.   MRN: [id number] Daily Benign Gynecology Progress Note AFRAH RYKER  [id number]  POD#1  Subjective:  Overnight Events: no acute events Complaints: none She denies: fevers, chills, chest pain, trouble breathing, nausea, vomiting, severe abdominal pain.  She has tolerated: clears with some solids She reports her pain is well controlled with iv dilaudid and has just started PO norco and ibuprofen.   She is not ambulating and is not voiding due to her Foley catheter in place.  She has not passed flatus.   Objective:  Most recent vitals Temp: 98 F (36.7 C)  BP: 135/61 mmHg  Pulse Rate: (!) 106  Resp: 18  SpO2: 100 %   Vitals Range over 24 hours BP  Min: 115/66  Max: 153/79 Temp  Avg: 98.1 F (36.7 C)  Min: 97.5 F (36.4 C)  Max: 98.7 F (37.1 C) Pulse  Avg: 94.5  Min: 83  Max: 106 SpO2  Avg: 99.3 %  Min: 96 %  Max: 100 %   Urine Output: 1,800 mL over 12 hours.  450 mL/4 hours  Physical Exam General: alert, well appearing, and in no distress Heart: regular rate and rhythm, systolic ejection murmur Lungs: clear to auscultation, no wheezes, rales or rhonchi, symmetric air entry, no tachypnea, retractions or cyanosis Abdomen: abdomen is soft without significant tenderness, masses, organomegaly or guarding. Incision: all incisions clean, dry, and intact.  Bandages in place Extremities: no redness or tenderness in the calves or thighs, no edema. SCDS in place.  AM Labs Lab Results  Component Value Date   WBC 13.9* 03/13/2016   HGB 10.3* 03/13/2016   HCT 31.3* 03/13/2016   PLT 380 03/13/2016   NA 134* 03/13/2016   K 3.9 03/13/2016   CREATININE 0.80 03/13/2016   BUN 8 03/13/2016    Recent Labs     03/12/16  1958  03/13/16  0629  WBC  19.2*  13.9*  HGB  11.6*  10.3*  HCT  35.9  31.3*  PLT  406  380  NA   --   134*  CREATININE   --   0.80      Recent Labs     03/13/16  0629  K  3.9     Assessment:   Robin Kelly is a 50 y.o. female POD#1 s/p total abdominal hysterectomy, doing well overall.    Plan:  Pain Control:  Switching PO pain meds today as tolerated, iv for breakthrough Pulmonary:  IS Heme: blood count drop appropriate given EBL, recheck tomorrow ID: No current issues. WBC count trending down. GI: no issues currently GU:  UOP adequate, dc foley today Nutrition:  Advance diet as tolerated IVF: LR at 50ml/hr DVT Prophylaxis:  SCDs Activity: increase activity to ambulation with assistance today Anticipated Discharge: POD 2 or 3  Will Bonnet, MD  03/13/2016 7:42 AM

## 2016-03-13 NOTE — Op Note (Signed)
Op Note Total Abdominal Hysterectomy   Pre-Op Diagnosis:  1) Uterine Fibroids, unspecified location 2) menorrhagia, irregular cycle  Post-Op Diagnosis:  1) Uterine Fibroids, unspecified location 2) menorrhagia, irregular cycle  Procedures:  1. Operative laparoscopy converted to laparotomy with total abdominal hysterectomy, left salpingectomy 2. Cystoscopy   Primary Surgeon: Dr. Prentice Docker  Assistant Surgeon: Larey Days, MD   EBL: 800 ml  IVF: 3,800 mL  Urine output: 700 mL  Specimens: Uterus, left fallopian tubes, cervix   Drains: Foley to gravity   Complications: None   Disposition: PACU   Condition: Stable   Findings:  1) multiple filmy adhesions of uterus to bilateral sidewalls, anterior abdominal wall 2) dense adhesions of bladder to mid uterus and lower uterine segment 3) efflux of urine from bilateral ureteral orifices with no apparent damage to bladder wall 4) absent right fallopian tube and ovary  Procedure Summary: The patient presented to the surgical admission suite, where consents were reviewed and confirmed. She was then taken to the operating room where a time-out was performed. General endotracheal anesthesia was induced without difficulty. Patient was examined with findings as noted above.  The patient was then prepped and draped in the normal sterile fashion with her legs in the Yellow fin stirrups. .   After a timeout was called an indwelling catheter was placed in her bladder. A sterile speculum was placed in the vagina and a single-tooth tenaculum was used to grasp the anterior lip of the cervix. A v-care uterine manipulator was affixed to the tenaculum. The speculum was removed from the vagina.  Attention was turned to the abdomen where after injection of local anesthetic, a 5 mm supraumbilical incision was made with the scalpel. Entry into the abdomen was obtained via Optiview trocar technique (a blunt entry technique with camera visualization  through the obturator upon entry). Verification of entry into the abdomen was obtained using opening pressures. The abdomen was insufflated with CO2. The camera was introduced through the trocar with verification of atraumatic entry.  The other two robotic ports (right and left) were created in accordance with the manufacturer's recommendations.  A right lower quadrant 11 mm assistant port was placed under direct intra-abdominal camera visualization.    The the 38mm camera port was extended to 35mm after removal of the trochar.  The Intuitive 56mm camera trochar was placed. After placing the patient in steep Trendelenburg positioning the robot was side-docked from the left without difficulty.  The robotic instruments were placed under visualization without difficulty.  The robotic portion of the case included adhesiolysis of the filmy adhesions surrounding the uterus. The left round ligament was identified, cauterized, and transected.  The left fallopian tube was freed from the mesosalpynx was transected using cauterization and sharp dissection.  The broad ligament was transected and the left utero-ovarian ligament was cauterized and transected. The broad ligament was then transected. Attempt was made to get the bladder separated from the anterior uterus.  However, the bladder was densely adherent and the patient was having ooze of blood from every attempted surgical dissection site.  This created poor visualization.  Given concern for future bladder injury during the case and difficulty with adequate visualization, the laparoscopic portion of the case was abandoned.  The instruments were removed from the abdomen and the robot was un-docked according to the recommendations of the manufacturer.   The uterine manipulator was removed.  A Pfannenstiel skin incision was made 2cm above the pubic symphysis with a scalpel and carried through  to the underlying layer of fascia with electrocautery. The fascia was scored in  the midline and the incision extended laterally with electrocuatery. The rectus muscles were separated in the midline and peritoneum identified and entered bluntly. The peritoneal incision was extended with electrocautery and gentle traction. The pelvis was inspected and the above findings noted. The anterior and posterior leaves of the broad ligament were entered and a bladder flap was created with the electrocautery. The uteters were identified bilaterally. The bladder flap was carefully developed with sharp dissection. The uterine vessels were skeletonized and curved clamps used to clamp the uterine arteries, which were suture ligated with 0 vicryl bilaterally. We then took successive bites with straight clamps down the cardinal ligament staying close to the cervix. We reached the external os of the cervix and clamped across with curved clamps. The tissue, which included the uterosacrals, was transected, suture ligated, and held bilaterally. The vagina had been entered on each side and the Jorgenson scissors were used to transect the remaining vaginal tissue. The cervix was removed and the vaginal epithelium was then closed with several figure-of-eights of 0.0  Vicryl sutures. The uterosacrals were then tied to the vaginal angle stitch.  The cuff was irrigated and good hemostasis noted. To ensure continued hemostasis (as every dissection site along the lower broad ligament and the vaginal cuff was quite oozy during portions of the case) Arista 3 grams was placed along the vaginal cuff closure and lower uterine segment pedicles.  Patient then underwent cystoscopy which showed no bladder wall injury and bilateral ureteral efflux. The vaginal cuff and pedicles bilaterally were again inspected with good hemostasis noted. The fascia was closed with 0 PDS in a running fashion. The subcutaneous tissue was reapproximated with interrupted 2-0 plain gut and the skin was closed with staples. A honeycomb dressing was  applied.  The right lower quadrant 75mm site was closed at the fascial layer with 0 vicryl.  The skin sites were reapproximated with 4-0 vicryl in a subcuticular fashion and each laparoscopic incision site was covered with surgical skin glue.    The patient tolerated the procedure well and was taken to the PACU in stable condition. Sponge, lap, needle, and instrument counts were correct x 2.  The vagina was inspected and no instruments or sponges remained.  For VTE prophylaxis the patient was wearing SCDs which were on and operating throughout the case.  She receive Ancef 2 grams within 1 hour of the start of the case.  Given the case was quite long, the Ancef was redosed after about 4 hours at 2 grams IV.    Prentice Docker, MD 03/13/2016 8:33 AM

## 2016-03-13 NOTE — Anesthesia Postprocedure Evaluation (Signed)
Anesthesia Post Note  Patient: Robin Kelly  Procedure(s) Performed: Procedure(s) (LRB): ROBOTIC ASSISTED TOTAL HYSTERECTOMY  (Bilateral) CYSTOSCOPY (N/A) HYSTERECTOMY ABDOMINAL WITH LEFT SALPINGECTOMY (Bilateral)  Patient location during evaluation: PACU Anesthesia Type: General Level of consciousness: awake and alert and oriented Pain management: pain level controlled Vital Signs Assessment: post-procedure vital signs reviewed and stable Respiratory status: spontaneous breathing Cardiovascular status: blood pressure returned to baseline Anesthetic complications: no    Last Vitals:  Filed Vitals:   03/13/16 0739 03/13/16 1202  BP: 135/61 140/72  Pulse: 106 99  Temp: 36.7 C 37.1 C  Resp: 18 18    Last Pain:  Filed Vitals:   03/13/16 1203  PainSc: 10-Worst pain ever                 Crissie Aloi

## 2016-03-14 ENCOUNTER — Encounter: Payer: Self-pay | Admitting: Obstetrics and Gynecology

## 2016-03-14 DIAGNOSIS — D649 Anemia, unspecified: Secondary | ICD-10-CM | POA: Diagnosis not present

## 2016-03-14 LAB — CBC
HCT: 29.1 % — ABNORMAL LOW (ref 35.0–47.0)
Hemoglobin: 9.7 g/dL — ABNORMAL LOW (ref 12.0–16.0)
MCH: 28.6 pg (ref 26.0–34.0)
MCHC: 33.3 g/dL (ref 32.0–36.0)
MCV: 86.1 fL (ref 80.0–100.0)
PLATELETS: 335 10*3/uL (ref 150–440)
RBC: 3.38 MIL/uL — ABNORMAL LOW (ref 3.80–5.20)
RDW: 16.4 % — AB (ref 11.5–14.5)
WBC: 10.8 10*3/uL (ref 3.6–11.0)

## 2016-03-14 LAB — BASIC METABOLIC PANEL
Anion gap: 3 — ABNORMAL LOW (ref 5–15)
BUN: 7 mg/dL (ref 6–20)
CO2: 28 mmol/L (ref 22–32)
CREATININE: 0.66 mg/dL (ref 0.44–1.00)
Calcium: 8.1 mg/dL — ABNORMAL LOW (ref 8.9–10.3)
Chloride: 108 mmol/L (ref 101–111)
GFR calc Af Amer: >60 mL/min (ref >60)
GLUCOSE: 108 mg/dL — AB (ref 65–99)
Potassium: 3.9 mmol/L (ref 3.5–5.1)
SODIUM: 139 mmol/L (ref 135–145)

## 2016-03-14 MED ORDER — IBUPROFEN 600 MG PO TABS: 600.0000 mg | ORAL_TABLET | Freq: Four times a day (QID) | ORAL | Status: DC | PRN

## 2016-03-14 MED ORDER — HYDROCODONE-ACETAMINOPHEN 5-325 MG PO TABS: 2.0000 | ORAL_TABLET | ORAL | Status: DC | PRN

## 2016-03-14 NOTE — Discharge Summary (Signed)
DC Summary Discharge Summary   Patient ID: Robin Kelly [id number] 50 y.o. March 20, 1966  Admit date: 03/12/2016  Discharge date: 03/14/2016  Principal Diagnoses:  1) menorrhagia with irregular cycles 2) fibroid uterus, unspecified  Secondary Diagnoses:  None  Procedures performed during the hospitalization:  Attempted robot assisted total laparoscopic hysterectomy, converted to total abdominal hysterectomy, left salpingectomy, cystoscopy on 03/12/16.  HPI: 50 y.o. female with long history of abnormal uterine bleeding leading to severe anemia.  Attempted control with hormonal medication that failed. She underwent hysteroscopy, dilation and curettage, which did not help her symptoms.  Desiring definitive management, she underwent the above procedures.  Past Medical History  Diagnosis Date  . Heavy menstrual bleeding   . Shortness of breath dyspnea     "due to anemia"  . Anxiety     due to heavy bleeding and SOB  . Seasonal allergies   . Anemia     Past Surgical History  Procedure Laterality Date  . Cholecystectomy    . Cesarean section      x2  . Dilatation & curettage/hysteroscopy with myosure N/A 02/06/2016    Procedure: DILATATION & CURETTAGE HYSTEROSCOPY  polypectomy;  Surgeon: Will Bonnet, MD;  Location: [facility];  Service: Gynecology;  Laterality: N/A;  . Polypectomy N/A 02/06/2016    Procedure: POLYPECTOMY;  Surgeon: Will Bonnet, MD;  Location: [facility];  Service: Gynecology;  Laterality: N/A;  . Salpingoophorectomy Right     during appendectomy.  Marland Kitchen Appendectomy  2001    right tube and ovary removed also.  . Robotic assisted total hysterectomy with salpingectomy Bilateral 03/12/2016    Converted to total abdominal hysterectom  . Cystoscopy N/A 03/12/2016    Procedure: CYSTOSCOPY;  Surgeon: Will Bonnet, MD;  Location: [facility];  Service: Gynecology;  Laterality: N/A;    Allergies  Allergen Reactions  . Penicillins Swelling  . Oxycodone  Palpitations    Social History  Substance Use Topics  . Smoking status: Never Smoker   . Smokeless tobacco: None  . Alcohol Use: No    Family History  Problem Relation Age of Onset  . Hypertension Mother   . Hypertension Father   . Heart failure Father     Hospital Course:  Admitted for above noted surgery which occurred without incident.  Routine postop course meeting discharge goals on post-op day #2.  She was voiding, ambulating, tolerating PO, and tolerating and oral diet, + flatus. Her vitals and labs were stable.    Discharge Exam: BP 106/70 mmHg  Pulse 63  Temp(Src) 97.6 F (36.4 C) (Oral)  Resp 18  SpO2 100% General  no apparent distress   CV  RRR   Pulmonary  clear to ausculatation bllaterally   Abdomen  Soft, non-tender, non-distended, +BS  Incisions: clean, dry, intact   Extremities  no edema, symmetric, SCDs in place    Condition at Discharge: Stable  Complications affecting treatment: None  Discharge Medications:    Medication List    STOP taking these medications        acetaminophen 500 MG tablet  Commonly known as:  TYLENOL     azithromycin 250 MG tablet  Commonly known as:  ZITHROMAX Z-PAK     fluticasone 50 MCG/ACT nasal spray  Commonly known as:  FLONASE     medroxyPROGESTERone 10 MG tablet  Commonly known as:  PROVERA      TAKE these medications        cetirizine 10 MG tablet  Commonly  known as:  ZYRTEC  Take 10 mg by mouth daily as needed for allergies (takes during allergy season.).     HYDROcodone-acetaminophen 5-325 MG tablet  Commonly known as:  NORCO/VICODIN  Take 2 tablets by mouth every 4 (four) hours as needed for severe pain (pain score >7/10).     ibuprofen 600 MG tablet  Commonly known as:  ADVIL,MOTRIN  Take 1 tablet (600 mg total) by mouth every 6 (six) hours as needed for fever or headache.     SLOW FE PO  Take 1 tablet by mouth every morning.       Follow-up arrangements:  Follow-up Information    Follow  up with Will Bonnet, MD In 1 week.   Specialty:  Obstetrics and Gynecology   Why:  For wound re-check, staple removal   Contact information:   [address] [place] [address] [phone]      Discharge Disposition: Home in stable condition  Signed: Will Bonnet, MD 03/14/2016 8:15 AM

## 2016-03-14 NOTE — Progress Notes (Signed)
Pt understands all D/C instructions and need to attend follow-up appointment. Patient taken downstairs in a wheelchair via axillary

## 2016-03-17 LAB — SURGICAL PATHOLOGY

## 2016-05-03 ENCOUNTER — Ambulatory Visit
Admission: EM | Admit: 2016-05-03 | Discharge: 2016-05-03 | Disposition: A | Discharge status: Home / Self Care | Payer: Managed Care, Other (non HMO) | Attending: Family Medicine | Admitting: Family Medicine

## 2016-05-03 ENCOUNTER — Encounter: Payer: Self-pay | Admitting: *Deleted

## 2016-05-03 DIAGNOSIS — J0100 Acute maxillary sinusitis, unspecified: Secondary | ICD-10-CM | POA: Diagnosis not present

## 2016-05-03 DIAGNOSIS — J309 Allergic rhinitis, unspecified: Secondary | ICD-10-CM

## 2016-05-03 MED ORDER — AZITHROMYCIN 250 MG PO TABS: ORAL_TABLET | ORAL | Status: DC

## 2016-05-03 MED ORDER — BENZONATATE 100 MG PO CAPS: 100.0000 mg | ORAL_CAPSULE | Freq: Three times a day (TID) | ORAL | Status: DC | PRN

## 2016-05-03 NOTE — ED Provider Notes (Signed)
[facility]  ____________________________________________  Time seen: Approximately 9:58 AM  I have reviewed the triage vital signs and the nursing notes.   HISTORY  Chief Complaint Facial Pain; Cough; Sore Throat; and Headache  HPI Robin Kelly is a 50 y.o. female  presents for complaints of 2 weeks of runny nose, nasal congestion, sinus pressure and postnasal drainage. Also reports accompanying sore throat. Patient reports that she feels the postnasal drainage is causing her sore throat. Patient reports that she has a long history of seasonal allergies. Patient reports that her allergies have been acting up in the last 2 weeks. Patient reports she has been taking home over-the-counter antihistamines and using her Flonase daily. Patient reports that she is having sinus pressure in her cheeks causing her cheeks to feel sore. Reports she can constantly get very thick greenish drainage up and blowing her nose and coughing. Reports continues to eat and drink well. Denies fevers.  Denies chest pain, shortness of breath, abdominal pain, dysuria, neck pain, back pain, rash, weakness, recent sickness, extremity pain or extremity swelling.  Patient's last menstrual period was 10/23/2015. Einar Pheasant, MD: PCP    Past Medical History  Diagnosis Date  . Heavy menstrual bleeding   . Shortness of breath dyspnea     "due to anemia"  . Anxiety     due to heavy bleeding and SOB  . Seasonal allergies   . Anemia     Patient Active Problem List   Diagnosis Date Noted  . Anemia associated with acute blood loss 03/14/2016  . Status post abdominal hysterectomy 03/12/2016  . Status post hysterectomy 03/12/2016  . Menorrhagia with irregular cycle 02/06/2016  . Endometrial polyp 02/06/2016  . Fibroids 02/06/2016    Past Surgical History  Procedure Laterality Date  . Cholecystectomy    . Cesarean section      x2  . Dilatation & curettage/hysteroscopy with myosure N/A 02/06/2016     Procedure: DILATATION & CURETTAGE HYSTEROSCOPY  polypectomy;  Surgeon: Will Bonnet, MD;  Location: [facility];  Service: Gynecology;  Laterality: N/A;  . Polypectomy N/A 02/06/2016    Procedure: POLYPECTOMY;  Surgeon: Will Bonnet, MD;  Location: [facility];  Service: Gynecology;  Laterality: N/A;  . Salpingoophorectomy Right     during appendectomy.  Marland Kitchen Appendectomy  2001    right tube and ovary removed also.  . Robotic assisted total hysterectomy with salpingectomy Bilateral 03/12/2016    Converted to total abdominal hysterectom  . Cystoscopy N/A 03/12/2016    Procedure: CYSTOSCOPY;  Surgeon: Will Bonnet, MD;  Location: [facility];  Service: Gynecology;  Laterality: N/A;    Current Outpatient Rx  Name  Route  Sig  Dispense  Refill  . cetirizine (ZYRTEC) 10 MG tablet   Oral   Take 10 mg by mouth daily as needed for allergies (takes during allergy season.).          Marland Kitchen fluticasone (FLONASE) 50 MCG/ACT nasal spray   Each Nare   Place into both nostrils daily.                                            Allergies Penicillins and Oxycodone  Family History  Problem Relation Age of Onset  . Hypertension Mother   . Hypertension Father   . Heart failure Father     Social History Social History  Substance Use Topics  . Smoking status: Never Smoker   . Smokeless tobacco: None  . Alcohol Use: No    Review of Systems Constitutional: No fever/chills Eyes: No visual changes. ENT: As above.  Cardiovascular: Denies chest pain. Respiratory: Denies shortness of breath. Gastrointestinal: No abdominal pain.  No nausea, no vomiting.  No diarrhea.  No constipation. Genitourinary: Negative for dysuria. Musculoskeletal: Negative for back pain. Skin: Negative for rash. Neurological: Negative for headaches, focal weakness or numbness.  10-point ROS otherwise negative.  ____________________________________________   PHYSICAL EXAM:  VITAL SIGNS: ED Triage Vitals  Enc  Vitals Group     BP 05/03/16 0949 139/73 mmHg     Pulse Rate 05/03/16 0949 85     Resp 05/03/16 0949 16     Temp 05/03/16 0949 98.6 F (37 C)     Temp Source 05/03/16 0949 Oral     SpO2 05/03/16 0949 99 %     Weight 05/03/16 0949 180 lb (81.647 kg)     Height 05/03/16 0949 5\' 2"  (1.575 m)     Head Cir --      Peak Flow --      Pain Score 05/03/16 0955 5     Pain Loc --      Pain Edu? --      Excl. in [place]? --    Constitutional: Alert and oriented. Well appearing and in no acute distress. Eyes: Conjunctivae are normal. PERRL. EOMI. Head: Atraumatic.Mild to moderate tenderness to palpation bilateral maxillary sinuses, mild tenderness to palpation bilateral frontal sinuses. No swelling. No erythema.   Ears: no erythema, normal TMs bilaterally.   Nose: nasal congestion with bilateral nasal turbinate erythema and edema.   Mouth/Throat: Mucous membranes are moist.  Oropharynx non-erythematous.No tonsillar swelling or exudate.  Neck: No stridor.  No cervical spine tenderness to palpation. Hematological/Lymphatic/Immunilogical: No cervical lymphadenopathy. Cardiovascular: Normal rate, regular rhythm. Grossly normal heart sounds.  Good peripheral circulation. Respiratory: Normal respiratory effort.  No retractions. Lungs CTAB. No wheezes, rales or rhonchi. Good air movement.  Gastrointestinal: Soft and nontender. No distention. Normal Bowel sounds. No CVA tenderness. Musculoskeletal: No lower or upper extremity tenderness nor edema.  Bilateral pedal pulses equal and easily palpated. No cervical, thoracic or lumbar tenderness to palpation.  Neurologic:  Normal speech and language. No gross focal neurologic deficits are appreciated. No gait instability. Skin:  Skin is warm, dry and intact. No rash noted. Psychiatric: Mood and affect are normal. Speech and behavior are normal.  ____________________________________________   LABS (all labs ordered are listed, but only abnormal results are  displayed)  Labs Reviewed - No data to display ____________________________________________  RADIOLOGY  No results found. ____________________________________________   INITIAL IMPRESSION / ASSESSMENT AND PLAN / ED COURSE  Pertinent labs & imaging results that were available during my care of the patient were reviewed by me and considered in my medical decision making (see chart for details).  Very well-appearing patient. No acute distress. Present for the complaint of 2 weeks of runny nose, nasal congestion, sinus pressure and sinus drainage. Reports history of seasonal allergies as well as sinus infections. Suspect maxillary sinusitis. Will treat with oral azithromycin as penicillin allergic and patient requests to not be treated with doxycycline at this time. PRN tessalon perles. Encouraged rest, fluids, continue home antihistamine and Flonase.Discussed indication, risks and benefits of medications with patient.  Discussed follow up with Primary care physician this week. Discussed follow up and return parameters including no resolution or any worsening concerns.  Patient verbalized understanding and agreed to plan.   ____________________________________________   FINAL CLINICAL IMPRESSION(S) / ED DIAGNOSES  Final diagnoses:  Acute maxillary sinusitis, recurrence not specified  Allergic rhinitis, unspecified allergic rhinitis type     Discharge Medication List as of 05/03/2016 10:09 AM    START taking these medications   Details  azithromycin (ZITHROMAX Z-PAK) 250 MG tablet Take 2 tablets (500 mg) on  Day 1,  followed by 1 tablet (250 mg) once daily on Days 2 through 5., Normal    benzonatate (TESSALON PERLES) 100 MG capsule Take 1 capsule (100 mg total) by mouth 3 (three) times daily as needed for cough., Starting 05/03/2016, Until Discontinued, Normal        Note: This dictation was prepared with Dragon dictation along with smaller phrase technology. Any transcriptional  errors that result from this process are unintentional.      Marylene Land, NP 05/03/16 1106

## 2016-05-03 NOTE — Discharge Instructions (Signed)
Take medication as prescribed. Rest. Drink plenty of fluids.  ° °Follow up with your primary care physician this week as needed. Return to Urgent care for new or worsening concerns.  ° ° °Sinusitis, Adult °Sinusitis is redness, soreness, and inflammation of the paranasal sinuses. Paranasal sinuses are air pockets within the bones of your face. They are located beneath your eyes, in the middle of your forehead, and above your eyes. In healthy paranasal sinuses, mucus is able to drain out, and air is able to circulate through them by way of your nose. However, when your paranasal sinuses are inflamed, mucus and air can become trapped. This can allow bacteria and other germs to grow and cause infection. °Sinusitis can develop quickly and last only a short time (acute) or continue over a long period (chronic). Sinusitis that lasts for more than 12 weeks is considered chronic. °CAUSES °Causes of sinusitis include: °· Allergies. °· Structural abnormalities, such as displacement of the cartilage that separates your nostrils (deviated septum), which can decrease the air flow through your nose and sinuses and affect sinus drainage. °· Functional abnormalities, such as when the small hairs (cilia) that line your sinuses and help remove mucus do not work properly or are not present. °SIGNS AND SYMPTOMS °Symptoms of acute and chronic sinusitis are the same. The primary symptoms are pain and pressure around the affected sinuses. Other symptoms include: °· Upper toothache. °· Earache. °· Headache. °· Bad breath. °· Decreased sense of smell and taste. °· A cough, which worsens when you are lying flat. °· Fatigue. °· Fever. °· Thick drainage from your nose, which often is green and may contain pus (purulent). °· Swelling and warmth over the affected sinuses. °DIAGNOSIS °Your health care provider will perform a physical exam. During your exam, your health care provider may perform any of the following to help determine if you have  acute sinusitis or chronic sinusitis: °· Look in your nose for signs of abnormal growths in your nostrils (nasal polyps). °· Tap over the affected sinus to check for signs of infection. °· View the inside of your sinuses using an imaging device that has a light attached (endoscope). °If your health care provider suspects that you have chronic sinusitis, one or more of the following tests may be recommended: °· Allergy tests. °· Nasal culture. A sample of mucus is taken from your nose, sent to a lab, and screened for bacteria. °· Nasal cytology. A sample of mucus is taken from your nose and examined by your health care provider to determine if your sinusitis is related to an allergy. °TREATMENT °Most cases of acute sinusitis are related to a viral infection and will resolve on their own within 10 days. Sometimes, medicines are prescribed to help relieve symptoms of both acute and chronic sinusitis. These may include pain medicines, decongestants, nasal steroid sprays, or saline sprays. °However, for sinusitis related to a bacterial infection, your health care provider will prescribe antibiotic medicines. These are medicines that will help kill the bacteria causing the infection. °Rarely, sinusitis is caused by a fungal infection. In these cases, your health care provider will prescribe antifungal medicine. °For some cases of chronic sinusitis, surgery is needed. Generally, these are cases in which sinusitis recurs more than 3 times per year, despite other treatments. °HOME CARE INSTRUCTIONS °· Drink plenty of water. Water helps thin the mucus so your sinuses can drain more easily. °· Use a humidifier. °· Inhale steam 3-4 times a day (for example, sit in   bathroom with the shower running).  Apply a warm, moist washcloth to your face 3-4 times a day, or as directed by your health care provider.  Use saline nasal sprays to help moisten and clean your sinuses.  Take medicines only as directed by your health care  provider.  If you were prescribed either an antibiotic or antifungal medicine, finish it all even if you start to feel better. SEEK IMMEDIATE MEDICAL CARE IF:  You have increasing pain or severe headaches.  You have nausea, vomiting, or drowsiness.  You have swelling around your face.  You have vision problems.  You have a stiff neck.  You have difficulty breathing.   This information is not intended to replace advice given to you by your health care provider. Make sure you discuss any questions you have with your health care provider.   Document Released: 11/11/2005 Document Revised: 12/02/2014 Document Reviewed: 11/26/2011 Elsevier Interactive Patient Education [employer].

## 2016-05-03 NOTE — ED Notes (Signed)
Onset of sore throat 2 weeks ago and then productive cough- green, facial pain, and headache 1 week ago. States feels she has a sinus infection.

## 2016-07-01 ENCOUNTER — Ambulatory Visit
Admission: EM | Admit: 2016-07-01 | Discharge: 2016-07-01 | Disposition: A | Discharge status: Home / Self Care | Payer: Managed Care, Other (non HMO) | Attending: Emergency Medicine | Admitting: Emergency Medicine

## 2016-07-01 DIAGNOSIS — J0100 Acute maxillary sinusitis, unspecified: Secondary | ICD-10-CM | POA: Diagnosis not present

## 2016-07-01 MED ORDER — DOXYCYCLINE HYCLATE 100 MG PO CAPS: 100.0000 mg | ORAL_CAPSULE | Freq: Two times a day (BID) | ORAL | 0 refills | Status: DC

## 2016-07-01 MED ORDER — IBUPROFEN 800 MG PO TABS: 800.0000 mg | ORAL_TABLET | Freq: Three times a day (TID) | ORAL | 0 refills | Status: DC | PRN

## 2016-07-01 NOTE — ED Triage Notes (Signed)
Patient complains of facial pain from sinus infection. It started about 2 weeks ago and have progressively getting worse.

## 2016-07-01 NOTE — ED Provider Notes (Signed)
[facility] ____________________________________________  Time seen: Approximately 8:54 AM  I have reviewed the triage vital signs and the nursing notes.   HISTORY  Chief Complaint Facial Pain   HPI Robin Kelly is a 50 y.o. female who has a history of chronic sinusitis and seasonal allergies, presenting today for the complaint of 2 weeks of runny nose, nasal congestion and sinus pressure. Patient reports she feels that her seasonal allergies or trigger for this. Patient reports that this is a chronic issue for her. Patient reports she normally has approximately 3-4 sinus infections per year but states this year seems to be more. Patient reports that she seems to think that is from her seasonal allergies being worse this year.  Patient reports she is frequently able to get thick greenish mucus up when blowing her nose and occasionally cough with associated postnasal drainage. Patient states the cough is very mild. Patient reports she's having sinus pressure discomfort described as an aching pressure to her bilateral cheeks and states her cheeks feel full and congested. Patient also reports both of her ears have felt itchy for the last few days. Patient reports the symptoms have been unresolved with over-the-counter congestion medicines as well as her allergy medicines and nasal spray.   Denies any chest pain, shortness breath, abdominal pain, dysuria, neck or back pain, dizziness, vision changes, headaches, fevers, decreased oral intake, extremity pain or extremity swelling. Patient reports last sinusitis was treated with oral antibiotics was approximately 2 months ago was treated with azithromycin. Patient reports her symptoms did fully resolve since last treatment.   Patient's last menstrual period was 10/23/2015. PCP: Einar Pheasant, MD    Past Medical History:  Diagnosis Date  . Anemia   . Anxiety    due to heavy bleeding and SOB  . Heavy menstrual bleeding   .  Seasonal allergies   . Shortness of breath dyspnea    "due to anemia"    Patient Active Problem List   Diagnosis Date Noted  . Anemia associated with acute blood loss 03/14/2016  . Status post abdominal hysterectomy 03/12/2016  . Status post hysterectomy 03/12/2016  . Menorrhagia with irregular cycle 02/06/2016  . Endometrial polyp 02/06/2016  . Fibroids 02/06/2016    Past Surgical History:  Procedure Laterality Date  . APPENDECTOMY  2001   right tube and ovary removed also.  Marland Kitchen CESAREAN SECTION     x2  . CHOLECYSTECTOMY    . CYSTOSCOPY N/A 03/12/2016   Procedure: CYSTOSCOPY;  Surgeon: Will Bonnet, MD;  Location: [facility] OR[facility];  Service: Gynecology;  Laterality: N/A;  . DILATATION & CURETTAGE/HYSTEROSCOPY WITH MYOSURE N/A 02/06/2016   Procedure: DILATATION & CURETTAGE HYSTEROSCOPY  polypectomy;  Surgeon: Will Bonnet, MD;  Location: [facility] OR[facility];  Service: Gynecology;  Laterality: N/A;  . POLYPECTOMY N/A 02/06/2016   Procedure: POLYPECTOMY;  Surgeon: Will Bonnet, MD;  Location: [facility];  Service: Gynecology;  Laterality: N/A;  . ROBOTIC ASSISTED TOTAL HYSTERECTOMY WITH SALPINGECTOMY Bilateral 03/12/2016   Converted to total abdominal hysterectom  . SALPINGOOPHORECTOMY Right    during appendectomy.    No current facility-administered medications for this encounter.   Current Outpatient Prescriptions:  .  azithromycin (ZITHROMAX Z-PAK) 250 MG tablet, Take 2 tablets (500 mg) on  Day 1,  followed by 1 tablet (250 mg) once daily on Days 2 through 5., Disp: 6 each, Rfl: 0 .  benzonatate (TESSALON PERLES) 100 MG capsule, Take 1 capsule (100 mg total) by mouth 3 (three) times  daily as needed for cough., Disp: 15 capsule, Rfl: 0 .  cetirizine (ZYRTEC) 10 MG tablet, Take 10 mg by mouth daily as needed for allergies (takes during allergy season.). , Disp: , Rfl:  .  doxycycline (VIBRAMYCIN) 100 MG capsule, Take 1 capsule (100 mg total) by mouth 2 (two) times daily., Disp: 20 capsule,  Rfl: 0 .  Ferrous Sulfate (SLOW FE PO), Take 1 tablet by mouth every morning. , Disp: , Rfl:  .  fluticasone (FLONASE) 50 MCG/ACT nasal spray, Place into both nostrils daily., Disp: , Rfl:  .  HYDROcodone-acetaminophen (NORCO/VICODIN) 5-325 MG tablet, Take 2 tablets by mouth every 4 (four) hours as needed for severe pain (pain score >7/10)., Disp: 40 tablet, Rfl: 0 .  ibuprofen (ADVIL,MOTRIN) 600 MG tablet, Take 1 tablet (600 mg total) by mouth every 6 (six) hours as needed for fever or headache., Disp: 30 tablet, Rfl: 0 .  ibuprofen (ADVIL,MOTRIN) 800 MG tablet, Take 1 tablet (800 mg total) by mouth every 8 (eight) hours as needed for mild pain or moderate pain., Disp: 15 tablet, Rfl: 0  Allergies Penicillins and Oxycodone  Family History  Problem Relation Age of Onset  . Hypertension Mother   . Hypertension Father   . Heart failure Father     Social History Social History  Substance Use Topics  . Smoking status: Never Smoker  . Smokeless tobacco: Never Used  . Alcohol use No    Review of Systems Constitutional: No fever/chills Eyes: No visual changes. ENT: No sore throat.As above. Cardiovascular: Denies chest pain. Respiratory: Denies shortness of breath. Gastrointestinal: No abdominal pain.  No nausea, no vomiting.  No diarrhea.  No constipation. Genitourinary: Negative for dysuria. Musculoskeletal: Negative for back pain. Skin: Negative for rash. Neurological: Negative for headaches, focal weakness or numbness.  10-point ROS otherwise negative.  ____________________________________________   PHYSICAL EXAM:  VITAL SIGNS: ED Triage Vitals  Enc Vitals Group     BP 07/01/16 0838 (!) 157/87     Pulse Rate 07/01/16 0838 79     Resp 07/01/16 0838 18     Temp 07/01/16 0838 98 F (36.7 C)     Temp Source 07/01/16 0838 Oral     SpO2 07/01/16 0838 97 %     Weight 07/01/16 0838 175 lb (79.4 kg)     Height 07/01/16 0838 5\' 2"  (1.575 m)     Head Circumference --       Peak Flow --      Pain Score 07/01/16 0837 8     Pain Loc --      Pain Edu? --      Excl. in [place]? --    Constitutional: Alert and oriented. Well appearing and in no acute distress. Eyes: Conjunctivae are normal. PERRL. EOMI. Head: Atraumatic.Mild to moderate tenderness to palpation bilateral maxillary sinuses. No frontal sinus tenderness to palpation. No swelling. No erythema.   Ears: no erythema, normal TMs bilaterally.   Nose: nasal congestion with bilateral nasal turbinate erythema and edema.   Mouth/Throat: Mucous membranes are moist.  Oropharynx non-erythematous.No tonsillar swelling or exudate.  Neck: No stridor.  No cervical spine tenderness to palpation. Hematological/Lymphatic/Immunilogical: No cervical lymphadenopathy. Cardiovascular: Normal rate, regular rhythm. Grossly normal heart sounds.  Good peripheral circulation. Respiratory: Normal respiratory effort.  No retractions. Lungs CTAB. No wheezes, rales or rhonchi. Good air movement.  Gastrointestinal: Soft and nontender. No distention. Normal Bowel sounds. No CVA tenderness. Musculoskeletal: No lower or upper extremity tenderness nor edema.  Bilateral  pedal pulses equal and easily palpated. No cervical, thoracic or lumbar tenderness to palpation.  Neurologic:  Normal speech and language. No gross focal neurologic deficits are appreciated. No gait instability. Skin:  Skin is warm, dry and intact. No rash noted. Psychiatric: Mood and affect are normal. Speech and behavior are normal.  ___________________________________________   LABS (all labs ordered are listed, but only abnormal results are displayed)  Labs Reviewed - No data to display  PROCEDURES Procedures     INITIAL IMPRESSION / ASSESSMENT AND PLAN / ED COURSE  Pertinent labs & imaging results that were available during my care of the patient were reviewed by me and considered in my medical decision making (see chart for details).  Well-appearing patient. No  acute distress. Presenting for acute on chronic complaints of sinusitis. Patient reports symptoms 2 weeks. Suspect maxillary sinusitis. Discussed in detail with patient we'll treat with oral doxycycline. Counseled continue in-home allergy medications and avoid triggers. Encourage patient to follow-up with Ear nose and throat due to the repetitiveness of her sinus issues. Patient also request prescription of 800 mg ibuprofen as this has helped her well in the past, prescription given. Encourage rest, fluids and PCP follow-up.Discussed indication, risks and benefits of medications with patient.Discussed photosensitivity with oral antibiotics.   Discussed follow up with Primary care physician this week. Discussed follow up and return parameters including no resolution or any worsening concerns. Patient verbalized understanding and agreed to plan.   ____________________________________________   FINAL CLINICAL IMPRESSION(S) / ED DIAGNOSES  Final diagnoses:  Acute maxillary sinusitis, recurrence not specified     Discharge Medication List as of 07/01/2016  9:05 AM    START taking these medications   Details  doxycycline (VIBRAMYCIN) 100 MG capsule Take 1 capsule (100 mg total) by mouth 2 (two) times daily., Starting Mon 07/01/2016, Normal    !! ibuprofen (ADVIL,MOTRIN) 800 MG tablet Take 1 tablet (800 mg total) by mouth every 8 (eight) hours as needed for mild pain or moderate pain., Starting Mon 07/01/2016, Normal     !! - Potential duplicate medications found. Please discuss with provider.      Note: This dictation was prepared with Dragon dictation along with smaller phrase technology. Any transcriptional errors that result from this process are unintentional.    Clinical Course      Marylene Land, NP 07/01/16 1015

## 2016-07-01 NOTE — Discharge Instructions (Signed)
Take medication as prescribed. Rest. Drink plenty of fluids.   Follow up with Ear Nose and Throat as discussed.   Follow up with your primary care physician this week as needed. Return to Urgent care for new or worsening concerns.

## 2016-09-04 ENCOUNTER — Encounter: Payer: Self-pay | Admitting: Internal Medicine

## 2016-09-04 ENCOUNTER — Ambulatory Visit (INDEPENDENT_AMBULATORY_CARE_PROVIDER_SITE_OTHER): Payer: Managed Care, Other (non HMO) | Admitting: Internal Medicine

## 2016-09-04 VITALS — BP 102/60 | HR 102 | Temp 98.4°F | Ht 62.0 in | Wt 205.4 lb

## 2016-09-04 DIAGNOSIS — Z90710 Acquired absence of both cervix and uterus: Secondary | ICD-10-CM | POA: Diagnosis not present

## 2016-09-04 DIAGNOSIS — M79671 Pain in right foot: Secondary | ICD-10-CM

## 2016-09-04 DIAGNOSIS — Z23 Encounter for immunization: Secondary | ICD-10-CM | POA: Diagnosis not present

## 2016-09-04 DIAGNOSIS — D649 Anemia, unspecified: Secondary | ICD-10-CM | POA: Diagnosis not present

## 2016-09-04 MED ORDER — CITALOPRAM HYDROBROMIDE 10 MG PO TABS: 10.0000 mg | ORAL_TABLET | Freq: Every day | ORAL | 3 refills | Status: DC

## 2016-09-04 NOTE — Progress Notes (Signed)
Pre visit review using our clinic review tool, if applicable. No additional management support is needed unless otherwise documented below in the visit note. 

## 2016-09-04 NOTE — Progress Notes (Signed)
Patient ID: Robin Kelly, female   DOB: 08/27/1966, 50 y.o.   MRN: [id number]   Subjective:    Patient ID: Robin Kelly, female    DOB: March 11, 1966, 50 y.o.   MRN: [id number]  HPI  Patient here to reestablish care.  She is s/p TAH/LSO and cystoscopy 03/12/16.  Performed by Dr Prentice Docker.  Doing well s/p surgery.  Had ETT and evaluation by Dr Ubaldo Glassing - 01/2016.  Everything checked out ok.  Is followed by [facility].  Last f/u end of 05/2016.  Had labs.  Need results.  No chest pain.  No sob.  No acid reflux.  No abdominal pain or cramping.  Bowels stable.  She is having pain in right heel.  Wears flip flops all of the time.  Pain limits her walking.  Handling stress relatively well.  Does feel she needs to be back on citalopram.  Did well on this medication.     Past Medical History:  Diagnosis Date  . Anemia   . Anxiety    due to heavy bleeding and SOB  . Heavy menstrual bleeding   . Seasonal allergies   . Shortness of breath dyspnea    "due to anemia"   Past Surgical History:  Procedure Laterality Date  . ABDOMINAL HYSTERECTOMY    . APPENDECTOMY  2001   right tube and ovary removed also.  Marland Kitchen CESAREAN SECTION     x2  . CHOLECYSTECTOMY    . CYSTOSCOPY N/A 03/12/2016   Procedure: CYSTOSCOPY;  Surgeon: Will Bonnet, MD;  Location: [facility] OR[facility];  Service: Gynecology;  Laterality: N/A;  . DILATATION & CURETTAGE/HYSTEROSCOPY WITH MYOSURE N/A 02/06/2016   Procedure: DILATATION & CURETTAGE HYSTEROSCOPY  polypectomy;  Surgeon: Will Bonnet, MD;  Location: [facility];  Service: Gynecology;  Laterality: N/A;  . POLYPECTOMY N/A 02/06/2016   Procedure: POLYPECTOMY;  Surgeon: Will Bonnet, MD;  Location: [facility];  Service: Gynecology;  Laterality: N/A;  . ROBOTIC ASSISTED TOTAL HYSTERECTOMY WITH SALPINGECTOMY Bilateral 03/12/2016   Converted to total abdominal hysterectom  . SALPINGOOPHORECTOMY Right    during appendectomy.   Family History  Problem Relation Age of Onset  . Hypertension  Mother   . Hypertension Father   . Heart failure Father    Social History   Social History  . Marital status: Married    Spouse name: N/A  . Number of children: N/A  . Years of education: N/A   Social History Main Topics  . Smoking status: Never Smoker  . Smokeless tobacco: Never Used  . Alcohol use No  . Drug use: No  . Sexual activity: Not Asked   Other Topics Concern  . None   Social History Narrative  . None    Outpatient Encounter Prescriptions as of 09/04/2016  Medication Sig  . cetirizine (ZYRTEC) 10 MG tablet Take 10 mg by mouth daily as needed for allergies (takes during allergy season.).   Marland Kitchen ibuprofen (ADVIL,MOTRIN) 800 MG tablet Take 1 tablet (800 mg total) by mouth every 8 (eight) hours as needed for mild pain or moderate pain.  . citalopram (CELEXA) 10 MG tablet Take 1 tablet (10 mg total) by mouth daily.  . [DISCONTINUED] azithromycin (ZITHROMAX Z-PAK) 250 MG tablet Take 2 tablets (500 mg) on  Day 1,  followed by 1 tablet (250 mg) once daily on Days 2 through 5.  . [DISCONTINUED] benzonatate (TESSALON PERLES) 100 MG capsule Take 1 capsule (100 mg total) by mouth 3 (three) times daily as  needed for cough.  . [DISCONTINUED] doxycycline (VIBRAMYCIN) 100 MG capsule Take 1 capsule (100 mg total) by mouth 2 (two) times daily.  . [DISCONTINUED] Ferrous Sulfate (SLOW FE PO) Take 1 tablet by mouth every morning.   . [DISCONTINUED] fluticasone (FLONASE) 50 MCG/ACT nasal spray Place into both nostrils daily.  . [DISCONTINUED] HYDROcodone-acetaminophen (NORCO/VICODIN) 5-325 MG tablet Take 2 tablets by mouth every 4 (four) hours as needed for severe pain (pain score >7/10).  . [DISCONTINUED] ibuprofen (ADVIL,MOTRIN) 600 MG tablet Take 1 tablet (600 mg total) by mouth every 6 (six) hours as needed for fever or headache.   No facility-administered encounter medications on file as of 09/04/2016.     Review of Systems  Constitutional: Negative for appetite change and  unexpected weight change.  HENT: Negative for congestion and sinus pressure.   Respiratory: Negative for cough, chest tightness and shortness of breath.   Cardiovascular: Negative for chest pain, palpitations and leg swelling.  Gastrointestinal: Negative for abdominal pain, diarrhea, nausea and vomiting.  Genitourinary: Negative for difficulty urinating and dysuria.  Musculoskeletal: Negative for back pain and joint swelling.       Right heel pain.    Skin: Negative for color change and rash.  Neurological: Negative for dizziness, light-headedness and headaches.  Psychiatric/Behavioral: Negative for agitation and dysphoric mood.       Objective:    Physical Exam  Constitutional: She appears well-developed and well-nourished. No distress.  HENT:  Nose: Nose normal.  Mouth/Throat: Oropharynx is clear and moist.  Neck: Neck supple. No thyromegaly present.  Cardiovascular: Normal rate and regular rhythm.   Pulmonary/Chest: Breath sounds normal. No respiratory distress. She has no wheezes.  Abdominal: Soft. Bowel sounds are normal. There is no tenderness.  Musculoskeletal: She exhibits no edema or tenderness.  Increased pain right heel - to palpation.    Lymphadenopathy:    She has no cervical adenopathy.  Skin: No rash noted. No erythema.  Psychiatric: She has a normal mood and affect. Her behavior is normal.    BP 102/60   Pulse (!) 102   Temp 98.4 F (36.9 C) (Oral)   Ht 5\' 2"  (1.575 m)   Wt 205 lb 6.4 oz (93.2 kg)   LMP 10/23/2015 Comment: bleeding since 10/23/15  SpO2 97%   BMI 37.57 kg/m  Wt Readings from Last 3 Encounters:  09/04/16 205 lb 6.4 oz (93.2 kg)  07/01/16 175 lb (79.4 kg)  05/03/16 180 lb (81.6 kg)     Lab Results  Component Value Date   WBC 10.8 03/14/2016   HGB 9.7 (L) 03/14/2016   HCT 29.1 (L) 03/14/2016   PLT 335 03/14/2016   GLUCOSE 108 (H) 03/14/2016   ALT 25 03/04/2016   AST 24 03/04/2016   NA 139 03/14/2016   K 3.9 03/14/2016   CL 108  03/14/2016   CREATININE 0.66 03/14/2016   BUN 7 03/14/2016   CO2 28 03/14/2016       Assessment & Plan:   Problem List Items Addressed This Visit    Anemia    Noticed with her period and s/p surgery.  Follow cbc.       Pain of right heel - Primary    Stretches.  Supports.  Given persistent pain, refer to podiatry for evaluation.        Relevant Orders   Ambulatory referral to Podiatry   Status post hysterectomy    Doing well s/p surgery.  Continue f/u with gyn.  Other Visit Diagnoses    Encounter for immunization       Relevant Orders   Flu Vaccine QUAD 36+ mos IM (Completed)       Einar Pheasant, MD

## 2016-09-15 ENCOUNTER — Encounter: Payer: Self-pay | Admitting: Internal Medicine

## 2016-09-15 DIAGNOSIS — M79671 Pain in right foot: Secondary | ICD-10-CM | POA: Insufficient documentation

## 2016-09-15 NOTE — Assessment & Plan Note (Signed)
Noticed with her period and s/p surgery.  Follow cbc.

## 2016-09-15 NOTE — Assessment & Plan Note (Addendum)
Doing well s/p surgery.  Continue f/u with gyn.

## 2016-09-15 NOTE — Assessment & Plan Note (Signed)
Stretches.  Supports.  Given persistent pain, refer to podiatry for evaluation.

## 2016-09-24 ENCOUNTER — Ambulatory Visit: Payer: Self-pay | Admitting: Podiatry

## 2016-10-02 ENCOUNTER — Other Ambulatory Visit: Payer: Self-pay

## 2016-10-02 MED ORDER — CITALOPRAM HYDROBROMIDE 10 MG PO TABS: 10.0000 mg | ORAL_TABLET | Freq: Every day | ORAL | 3 refills | Status: DC

## 2016-10-15 ENCOUNTER — Ambulatory Visit (INDEPENDENT_AMBULATORY_CARE_PROVIDER_SITE_OTHER): Payer: Managed Care, Other (non HMO)

## 2016-10-15 ENCOUNTER — Ambulatory Visit: Payer: Managed Care, Other (non HMO)

## 2016-10-15 ENCOUNTER — Ambulatory Visit (INDEPENDENT_AMBULATORY_CARE_PROVIDER_SITE_OTHER): Payer: Managed Care, Other (non HMO) | Admitting: Podiatry

## 2016-10-15 ENCOUNTER — Encounter: Payer: Self-pay | Admitting: Podiatry

## 2016-10-15 ENCOUNTER — Telehealth: Payer: Self-pay | Admitting: *Deleted

## 2016-10-15 VITALS — BP 136/81 | HR 86 | Resp 16

## 2016-10-15 DIAGNOSIS — M79673 Pain in unspecified foot: Secondary | ICD-10-CM

## 2016-10-15 DIAGNOSIS — R52 Pain, unspecified: Secondary | ICD-10-CM

## 2016-10-15 DIAGNOSIS — S86011A Strain of right Achilles tendon, initial encounter: Secondary | ICD-10-CM

## 2016-10-15 DIAGNOSIS — R600 Localized edema: Secondary | ICD-10-CM

## 2016-10-15 MED ORDER — IBUPROFEN-FAMOTIDINE 800-26.6 MG PO TABS: 1.0000 | ORAL_TABLET | Freq: Three times a day (TID) | ORAL | 1 refills | Status: DC

## 2016-10-15 NOTE — Telephone Encounter (Addendum)
-----   Message from Edrick Kins, DPM sent at 10/15/2016 11:10 AM EST ----- Regarding: MRI right ankle Please arrange MRI RT ankle.   Dx : achilles tendon rupture Right. Date of injury approximately 8 weeks ago.   Thanks! MRI orders sent to [facility]. 10/28/2016-Pt states she has an appt 11/07/2016 for MRI will the results be available for Dr. Amalia Hailey 11/08/2016 appt. 10/29/2016-I informed pt in most cases [facility] have the results in EPIC the next day, if not I can always call for results to be faxed. I told the pt that is she was having pain to keep Dr. Amalia Hailey 11/08/2016 and would see if needed to call for results, and even if not ready we would call with results and instructions after Dr. Amalia Hailey had reviewed. Pt agreed and kept appt as is.

## 2016-10-15 NOTE — Patient Instructions (Signed)
Achilles Tendon Rupture The Achilles tendon is a cord-like band that connects the muscles of your lower leg (calf) to your heel. An Achilles tendon rupture is an injury that involves a tear in this tendon. This tendon is the most common site of tendon tearing. What are the causes? This condition may be caused by:  Stress from a sudden stretching of the tendon. For example, this may occur when you land from a jump or when your heel drops down into a hole on uneven ground.  A hard, direct hit to the tendon.  Pushing off your foot forcefully, such as when sprinting, jumping, or changing direction while running. What increases the risk? This condition is more likely to develop in:  Runners.  People who play sports that involve sprinting, running, or jumping.  People who play contact sports.  People with a weak Achilles tendon. Tendons can weaken from aging, repeat injuries, and chronic tendinitis.  Males who are 61-52 years of age, especially those who do not exercise regularly. What are the signs or symptoms? Symptoms of this condition include:  Hearing a "pop" at the time of injury.  Severe, sudden pain in the back of the ankle.  Swelling and bruising.  Inability to actively point your toes down.  Pain when standing or walking.  A feeling of giving way when you step on the affected side. How is this diagnosed? This condition is usually diagnosed with a physical exam. During the exam, your health care provider may:  Touch the tendon and the structures around it.  Squeeze your calf to see if your foot moves.  Ask you to point and flex your foot. Sometimes, tests are done in addition to an exam. Tests may include:  An ultrasound.  An X-ray.  MRI. How is this treated? This condition may be treated with:  Ice applied to the area.  Pain medicine.  Rest.  Crutches.  A cast, splint, or other device to keep the ankle from moving (keep it immobilized).  Heel wedges  to reduce the stretch on your tendon as it heals.  Surgery. This option may depend on your age and your activity level. Follow these instructions at home: If you have a splint or brace:  Do not put pressure on any part of the splint until it is fully hardened. This may take several hours.  Wear the splint or brace as told by your health care provider. Remove it only as told by your health care provider.  Loosen the splint or brace if your toes tingle, become numb, or turn cold and blue.  Do not let your splint or brace get wet if it is not waterproof.  Keep the splint or brace clean. If you have a cast:  Do not put pressure on any part of the cast until it is fully hardened. This may take several hours.  Do not stick anything inside the cast to scratch your skin. Doing that increases your risk of infection.  Check the skin around the cast every day. Tell your health care provider about any concerns.  You may put lotion on dry skin around the edges of the cast. Do not put lotion on the skin underneath the cast.  Do not let your cast get wet if it is not waterproof.  Keep the cast clean. Bathing  Do not take baths, swim, or use a hot tub until your health care provider approves. Ask your health care provider if you can take showers. You may  only be allowed to take sponge baths for bathing.  If your cast, splint, or brace is not waterproof, cover it with a watertight covering when you take a bath or a shower. Managing pain, stiffness, and swelling  If directed, apply ice to the injured area.  Put ice in a plastic bag.  Place a towel between your skin and the bag.  Leave the ice on for 20 minutes, 2-3 times a day.  Move your toes often to avoid stiffness and to lessen swelling.  Raise (elevate) the injured area above the level of your heart while you are sitting or lying down. Do not dangle your leg over a chair, couch, or bed. Driving  Do not drive or operate heavy  machinery while taking prescription pain medicine.  Ask your health care provider when it is safe to drive if you have a cast, splint, or brace on a leg or foot that you use for driving. Activity  Return to your normal activities as told by your health care provider. Ask your health care provider what activities are safe for you.  Do exercises only as told by your health care provider. General instructions  Do not use the injured limb to support your body weight until your health care provider says that you can. Use crutches as told by your health care provider.  Do not use any tobacco products, such as cigarettes, chewing tobacco, and e-cigarettes. Tobacco can delay bone healing. If you need help quitting, ask your health care provider.  Take over-the-counter and prescription medicines only as told by your health care provider.  Keep all follow-up visits as told by your health care provider. This is important. How is this prevented?  Warm up and stretch before being active.  Cool down and stretch after being active.  Give your body time to rest between periods of activity.  Make sure to use equipment that fits you.  Be safe and responsible while being active to avoid falls.  Each week, do at least 150 minutes of moderate-intensity exercise, such as brisk walking or water aerobics.  Spread your workouts over the whole week, instead of just working out intensely one or two days of the week.  Make slow, incremental changes in intensity, distance, or time for running or sporting activity.  Maintain physical fitness, including:  Strength.  Flexibility.  Cardiovascular fitness.  Endurance. Contact a health care provider if:  Your pain and swelling increase.  Your pain is not controlled with medicines.  You develop new, unexplained symptoms.  Your symptoms get worse.  You cannot move your toes or foot.  You develop warmth and swelling in your foot.  You have an  unexplained fever. This information is not intended to replace advice given to you by your health care provider. Make sure you discuss any questions you have with your health care provider. Document Released: 11/11/2005 Document Revised: 07/16/2016 Document Reviewed: 10/04/2015 Elsevier Interactive Patient Education  2017 [employer].

## 2016-10-15 NOTE — Progress Notes (Signed)
Subjective:  Patient presents today for pain and tenderness to the right heel. Patient states that approximately 8 weeks ago she was running up the stairs at which time she noticed something: The back of her leg. Patient has had severe pain and tenderness with swelling for the past 8 weeks. There's been no alleviation of symptoms for the patient. Patient presents today for further treatment and evaluation.    Objective/Physical Exam General: The patient is alert and oriented x3 in no acute distress.  Dermatology: Skin is warm, dry and supple bilateral lower extremities. Negative for open lesions or macerations.  Vascular: Palpable pedal pulses bilaterally. No edema or erythema noted. Capillary refill within normal limits.  Neurological: Epicritic and protective threshold grossly intact bilaterally.   Musculoskeletal Exam: Significant localized edema noted to the posterior aspect of the right foot at the insertion of the Achilles tendon. Pain on palpation noted to the insertion of the Achilles tendon. There is decreased muscle strength with plantar flexion to the right foot.  Radiographic Exam:  Diffuse radiopacity noted to the posterior aspect of the right leg on lateral view infiltrating the Cager's triangle. No indication of fracture.  Assessment: #1 possible Achilles tendon rupture right #2 edema right ankle #3 pain in right ankle   Plan of Care:  #1 Patient was evaluated. Discussed in detail the conservative versus surgical management of an Achilles tendon rupture. Today all patient questions were answered. Today based on the clinical exam recommend surgical intervention pending MRI, which would consist of repair of Achilles tendon.  #2 cam boot and Ace wrap applied  #3 order for MRI right ankle placed #4 prescription for Duexis through [facility] #5 return to clinic in 3 weeks to review MRI results and schedule surgery   Dr. Edrick Kins, [place]

## 2016-10-15 NOTE — Addendum Note (Signed)
Addended by: Johnnye Lana A on: 10/15/2016 12:05 PM   Modules accepted: Orders

## 2016-10-28 ENCOUNTER — Telehealth: Payer: Self-pay | Admitting: Podiatry

## 2016-10-28 NOTE — Telephone Encounter (Signed)
Left message informing pt our office was waiting for prior authorization for the MRI but she could contact [facility] and set up her appt. I left the [facility] contact appt line.

## 2016-10-28 NOTE — Telephone Encounter (Signed)
Patient called said she saw Dr. Amalia Hailey several weeks ago and someone was supposed to call her to set up appt for an MRI. No calls as of yet. Please contact patient about her appointment date/ time.

## 2016-11-07 ENCOUNTER — Ambulatory Visit: Payer: Managed Care, Other (non HMO)

## 2016-11-08 ENCOUNTER — Ambulatory Visit: Payer: Managed Care, Other (non HMO) | Admitting: Podiatry

## 2016-11-13 ENCOUNTER — Ambulatory Visit: Payer: Managed Care, Other (non HMO)

## 2016-11-19 ENCOUNTER — Ambulatory Visit: Payer: Managed Care, Other (non HMO) | Admitting: Podiatry

## 2016-11-21 ENCOUNTER — Ambulatory Visit
Admission: RE | Admit: 2016-11-21 | Discharge: 2016-11-21 | Disposition: A | Discharge status: Home / Self Care | Payer: Managed Care, Other (non HMO) | Source: Ambulatory Visit | Attending: Podiatry | Admitting: Podiatry

## 2016-11-21 DIAGNOSIS — M7661 Achilles tendinitis, right leg: Secondary | ICD-10-CM | POA: Insufficient documentation

## 2016-11-21 DIAGNOSIS — Z87828 Personal history of other (healed) physical injury and trauma: Secondary | ICD-10-CM | POA: Diagnosis not present

## 2016-11-21 DIAGNOSIS — R600 Localized edema: Secondary | ICD-10-CM | POA: Insufficient documentation

## 2016-11-29 ENCOUNTER — Ambulatory Visit (INDEPENDENT_AMBULATORY_CARE_PROVIDER_SITE_OTHER): Payer: Commercial Managed Care - PPO | Admitting: Podiatry

## 2016-11-29 ENCOUNTER — Ambulatory Visit (INDEPENDENT_AMBULATORY_CARE_PROVIDER_SITE_OTHER): Payer: Commercial Managed Care - PPO

## 2016-11-29 DIAGNOSIS — S86001D Unspecified injury of right Achilles tendon, subsequent encounter: Secondary | ICD-10-CM

## 2016-11-29 DIAGNOSIS — M7671 Peroneal tendinitis, right leg: Secondary | ICD-10-CM | POA: Diagnosis not present

## 2016-11-29 DIAGNOSIS — M25571 Pain in right ankle and joints of right foot: Secondary | ICD-10-CM

## 2016-11-29 DIAGNOSIS — M7751 Other enthesopathy of right foot: Secondary | ICD-10-CM | POA: Diagnosis not present

## 2016-11-29 DIAGNOSIS — M76821 Posterior tibial tendinitis, right leg: Secondary | ICD-10-CM | POA: Diagnosis not present

## 2016-11-29 DIAGNOSIS — M659 Synovitis and tenosynovitis, unspecified: Secondary | ICD-10-CM | POA: Diagnosis not present

## 2016-11-29 MED ORDER — IBUPROFEN-FAMOTIDINE 800-26.6 MG PO TABS: 1.0000 | ORAL_TABLET | Freq: Three times a day (TID) | ORAL | 1 refills | Status: DC

## 2016-12-01 MED ORDER — BETAMETHASONE SOD PHOS & ACET 6 (3-3) MG/ML IJ SUSP: 3.0000 mg | Freq: Once | INTRAMUSCULAR | Status: DC

## 2016-12-01 NOTE — Progress Notes (Signed)
Subjective:  Patient presents today for follow-up evaluation of possible Achilles tendon rupture right lower extremity. Patient states that she has been wearing her CAM boot for the past 4 weeks. Patient states that she had a fall on Monday has noticed significant throbbing to her posterior Achilles tendon. Before she fell, she was improving.    Objective/Physical Exam General: The patient is alert and oriented x3 in no acute distress.  Dermatology: Skin is warm, dry and supple bilateral lower extremities. Negative for open lesions or macerations.  Vascular: Palpable pedal pulses bilaterally. No edema or erythema noted. Capillary refill within normal limits.  Neurological: Epicritic and protective threshold grossly intact bilaterally.   Musculoskeletal Exam: Pain on palpation noted to the posterior tibial tendon of the right lower extremity posterior to the medial malleolus.  Pain on palpation also noted to the lateral aspect of the right ankle overlying the peroneal tendons.  Pain on palpation also noted to the anterior medial and lateral aspects the patient's right ankle joint  Range of motion within normal limits to all pedal and ankle joints bilateral. Muscle strength 5/5 in all groups bilateral.   Assessment: #1 MRI results: Mild Achilles tendinosis with peritenonitis. Mild retrocalcaneal bursitis. Severe bone marrow edema at the superior posterior calcaneus at the Achilles insertion with a subtle linear component concern for nondisplaced fracture. #2 posterior tibial tendinitis right #3 peroneal tendinitis right #4 ankle joint capsulitis and synovitis right #5 pain in right ankle   Plan of Care:  #1 Patient was evaluated. MRI and x-rays of the right ankle were reviewed in detail. #2 injection of 0.5 mL Celestone Soluspan injected in the posterior tibial tendon sheath right lower extremity #3 injection of 0.5 mL Celestone Soluspan injected into the peroneal tendon sheath right lower  extremity #4 injection of 0.5 mL Celestone Soluspan injected in the patient's right ankle joint #5 prescription for physical therapy with a preferred location in [place] #6 refill prescription for Duexis #7 compression anklet dispensed #8 postop shoe dispensed #9 return to clinic in 4 weeks    Edrick Kins, DPM [facility]  Dr. Edrick Kins, [address]                                        [place], [place] [address]                Office [phone]  Fax [phone]

## 2016-12-02 ENCOUNTER — Telehealth: Payer: Self-pay | Admitting: *Deleted

## 2016-12-02 NOTE — Telephone Encounter (Addendum)
-----   Message from Edrick Kins, DPM sent at 12/01/2016  7:32 PM EST ----- Regarding: Physical Therapy Please arrange physical therapy preferrably in [place].  3x/week x 4 weeks.   Dx : achilles tendinitis Right  Thanks, Dr. Amalia Hailey. 12/02/2016-Faxed orders and demographics to [facility]. Informed pt of [facility] and contact phone. 01/22/2017-Pt states she takes the [facility] for the fractured heel and will see Dr. Amalia Hailey in about 3 weeks.Dr. Trinidad Curet refill as before. I reordered the Duexis as previously.

## 2016-12-31 ENCOUNTER — Ambulatory Visit: Payer: Commercial Managed Care - PPO | Admitting: Podiatry

## 2017-01-07 ENCOUNTER — Ambulatory Visit (INDEPENDENT_AMBULATORY_CARE_PROVIDER_SITE_OTHER): Payer: Commercial Managed Care - PPO | Admitting: Podiatry

## 2017-01-07 DIAGNOSIS — M7661 Achilles tendinitis, right leg: Secondary | ICD-10-CM | POA: Diagnosis not present

## 2017-01-07 DIAGNOSIS — S86001D Unspecified injury of right Achilles tendon, subsequent encounter: Secondary | ICD-10-CM | POA: Diagnosis not present

## 2017-01-07 MED ORDER — NONFORMULARY OR COMPOUNDED ITEM: 1.0000 g | Freq: Two times a day (BID) | 2 refills | Status: DC

## 2017-01-07 NOTE — Progress Notes (Signed)
   Subjective:  Patient presents today for follow-up evaluation of multiple complaints regarding the right lower extremity. Patient states that she does feel much better. She states that when she is on her feet for over 2 hours she begins to get tenderness to the posterior aspect of the right foot. Patient states that she still a lot of pain after standing for long periods of time and she also notices left knee pain. Patient's been taking ibuprofen 802 times per day and notices moderate improvement.    Objective/Physical Exam General: The patient is alert and oriented x3 in no acute distress.  Dermatology: Skin is warm, dry and supple bilateral lower extremities. Negative for open lesions or macerations.  Vascular: Palpable pedal pulses bilaterally. No edema or erythema noted. Capillary refill within normal limits.  Neurological: Epicritic and protective threshold grossly intact bilaterally.   Musculoskeletal Exam: Pain on palpation to the posterior aspect of the patient's right foot at the insertion of the Achilles tendon into the superior posterior aspect of the calcaneus. Negative for pain on palpation to the posterior tibial tendon as well as peroneal tendons of the right foot. Range of motion within normal limits to all pedal and ankle joints bilateral. Muscle strength 5/5 in all groups bilateral.    Assessment: #1  MRI results: Mild Achilles tendinosis with peritenonitis. Mild retrocalcaneal bursitis. Severe bone marrow edema at the superior posterior calcaneus at the Achilles insertion with a subtle linear component concern for nondisplaced fracture. #2 posterior tibial tendinitis right-resolved #3 peroneal tendinitis right-resolved #4 ankle joint capsulitis and synovitis right-resolved   Plan of Care:  #1 Patient was evaluated. #2 today recommend anti-inflammatory pain cream dispensed through [facility] #3 recommend immobilization in a cam boot. Cam boot was dispensed  today. #4 return to clinic in 4 weeks   Edrick Kins, DPM [facility]  Dr. Edrick Kins, [address]                                        [place], [place] [address]                Office [phone]  Fax [phone]

## 2017-01-21 ENCOUNTER — Telehealth: Payer: Self-pay | Admitting: Internal Medicine

## 2017-01-21 NOTE — Telephone Encounter (Signed)
Pt lvm requesting a referral to someone for her arthritis. She states that she has had arthritis in her hands for years but not experiencing it in both of her knees. Please advise.

## 2017-01-21 NOTE — Telephone Encounter (Signed)
Left message to call.

## 2017-01-22 ENCOUNTER — Telehealth: Payer: Self-pay | Admitting: *Deleted

## 2017-01-22 DIAGNOSIS — M25569 Pain in unspecified knee: Secondary | ICD-10-CM

## 2017-01-22 MED ORDER — IBUPROFEN-FAMOTIDINE 800-26.6 MG PO TABS: 1.0000 | ORAL_TABLET | Freq: Three times a day (TID) | ORAL | 1 refills | Status: DC

## 2017-01-22 NOTE — Telephone Encounter (Signed)
Pt requested to know If she could get a referral to rheumatology. Pt was advised by Dr. 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  Please call pt at  [phone]

## 2017-01-24 NOTE — Telephone Encounter (Signed)
You have never seen her for knee pain x 4 months. She was told she needed to be seen by you to see if she needs to get referral to ortho. She has app 4-12 but she wanted to see If you could see  Sooner.

## 2017-01-24 NOTE — Telephone Encounter (Signed)
I can refer her if she is having persistent knee pain.  Does she want to see ortho or does she want to see the person she mentioned in her message.

## 2017-01-27 NOTE — Telephone Encounter (Signed)
She would like referral to orthopedics she is ok with [facility]

## 2017-01-28 NOTE — Telephone Encounter (Signed)
She does not need to move up if seeing ortho.

## 2017-01-28 NOTE — Telephone Encounter (Signed)
I have placed the order for ortho referral.  Someone will be contacting her with an appt date and time.  Does she still need earlier appt with me (see her message).  If so, can schedule.

## 2017-01-30 NOTE — Telephone Encounter (Signed)
Patient seeing ortho see message ortho ... See other message dated 01/22/17

## 2017-02-07 ENCOUNTER — Ambulatory Visit: Payer: Commercial Managed Care - PPO | Admitting: Podiatry

## 2017-02-18 ENCOUNTER — Ambulatory Visit: Payer: Commercial Managed Care - PPO | Admitting: Podiatry

## 2017-02-28 ENCOUNTER — Ambulatory Visit: Payer: Commercial Managed Care - PPO | Admitting: Podiatry

## 2017-03-04 ENCOUNTER — Ambulatory Visit: Payer: Commercial Managed Care - PPO | Admitting: Podiatry

## 2017-03-06 ENCOUNTER — Ambulatory Visit (INDEPENDENT_AMBULATORY_CARE_PROVIDER_SITE_OTHER): Payer: Commercial Managed Care - PPO | Admitting: Internal Medicine

## 2017-03-06 ENCOUNTER — Ambulatory Visit (INDEPENDENT_AMBULATORY_CARE_PROVIDER_SITE_OTHER): Payer: Commercial Managed Care - PPO

## 2017-03-06 ENCOUNTER — Encounter: Payer: Self-pay | Admitting: Internal Medicine

## 2017-03-06 VITALS — BP 138/81 | HR 100 | Temp 98.6°F | Resp 12 | Ht 62.0 in | Wt 200.4 lb

## 2017-03-06 DIAGNOSIS — D649 Anemia, unspecified: Secondary | ICD-10-CM

## 2017-03-06 DIAGNOSIS — Z90710 Acquired absence of both cervix and uterus: Secondary | ICD-10-CM

## 2017-03-06 DIAGNOSIS — M25562 Pain in left knee: Secondary | ICD-10-CM

## 2017-03-06 DIAGNOSIS — M79671 Pain in right foot: Secondary | ICD-10-CM | POA: Diagnosis not present

## 2017-03-06 DIAGNOSIS — M79641 Pain in right hand: Secondary | ICD-10-CM | POA: Diagnosis not present

## 2017-03-06 DIAGNOSIS — J019 Acute sinusitis, unspecified: Secondary | ICD-10-CM

## 2017-03-06 MED ORDER — FLUTICASONE PROPIONATE 50 MCG/ACT NA SUSP: 2.0000 | Freq: Every day | NASAL | 2 refills | Status: DC

## 2017-03-06 MED ORDER — CITALOPRAM HYDROBROMIDE 10 MG PO TABS: 10.0000 mg | ORAL_TABLET | Freq: Every day | ORAL | 0 refills | Status: DC

## 2017-03-06 MED ORDER — DOXYCYCLINE HYCLATE 100 MG PO TABS
100.0000 mg | ORAL_TABLET | Freq: Two times a day (BID) | ORAL | 0 refills | Status: DC
Start: 2017-03-06 — End: 2017-05-27

## 2017-03-06 NOTE — Patient Instructions (Signed)
Take a probiotic while you are on the antibiotic and for two weeks after completing the antibiotic.    Examples:  florastor, phillips colon health or align

## 2017-03-06 NOTE — Progress Notes (Signed)
Pre-visit discussion using our clinic review tool. No additional management support is needed unless otherwise documented below in the visit note.  

## 2017-03-06 NOTE — Progress Notes (Signed)
Patient ID: MELEA PREZIOSO, female   DOB: [birth year], 51 y.o.   MRN: [id number]   Subjective:    Patient ID: DAMARA KLUNDER, female    DOB: 01-02-1966, 51 y.o.   MRN: [id number]  HPI  Patient here for a scheduled follow up.  She has seen ortho for her knee.  Persistent pain.  Saw Vance Peper.  Was given steroid dosepack.  Diagnosed with patella tendonitis.  Persistent pain.  Helped some.  Pain returning.  Plans to f/u with ortho.  She has also had problems with her right achiles.  Saw podiatry.  Has f/u in two weeks.  Being released.  Is better.  She has had persistent sinus pressure and congestion.  Green and blood mucus production from her nose.  Ears feel full.  Stopped up.  Increased drainage.  No cough.  Using flonase.  Taking zyrtec.  No chest pain.  No sob.  No acid reflux.  No abdominal pain.  Bowels moving.  Right hand pain and left thumb pain.     Past Medical History:  Diagnosis Date  . Anemia   . Anxiety    due to heavy bleeding and SOB  . Heavy menstrual bleeding   . Seasonal allergies   . Shortness of breath dyspnea    "due to anemia"   Past Surgical History:  Procedure Laterality Date  . ABDOMINAL HYSTERECTOMY    . APPENDECTOMY  2001   right tube and ovary removed also.  Marland Kitchen CESAREAN SECTION     x2  . CHOLECYSTECTOMY    . CYSTOSCOPY N/A 03/12/2016   Procedure: CYSTOSCOPY;  Surgeon: Will Bonnet, MD;  Location: [facility] ORS;  Service: Gynecology;  Laterality: N/A;  . DILATATION & CURETTAGE/HYSTEROSCOPY WITH MYOSURE N/A 02/06/2016   Procedure: DILATATION & CURETTAGE HYSTEROSCOPY  polypectomy;  Surgeon: Will Bonnet, MD;  Location: [facility];  Service: Gynecology;  Laterality: N/A;  . POLYPECTOMY N/A 02/06/2016   Procedure: POLYPECTOMY;  Surgeon: Will Bonnet, MD;  Location: [facility] ORS;  Service: Gynecology;  Laterality: N/A;  . ROBOTIC ASSISTED TOTAL HYSTERECTOMY WITH SALPINGECTOMY Bilateral 03/12/2016   Converted to total abdominal hysterectom  . SALPINGOOPHORECTOMY  Right    during appendectomy.   Family History  Problem Relation Age of Onset  . Hypertension Mother   . Hypertension Father   . Heart failure Father    Social History   Social History  . Marital status: Married    Spouse name: N/A  . Number of children: N/A  . Years of education: N/A   Social History Main Topics  . Smoking status: Never Smoker  . Smokeless tobacco: Never Used  . Alcohol use No  . Drug use: No  . Sexual activity: Not Asked   Other Topics Concern  . None   Social History Narrative  . None    Outpatient Encounter Prescriptions as of 03/06/2017  Medication Sig  . cetirizine (ZYRTEC) 10 MG tablet Take 10 mg by mouth daily as needed for allergies (takes during allergy season.).   Marland Kitchen citalopram (CELEXA) 10 MG tablet Take 1 tablet (10 mg total) by mouth daily.  . [DISCONTINUED] citalopram (CELEXA) 10 MG tablet Take 1 tablet (10 mg total) by mouth daily.  Marland Kitchen doxycycline (VIBRA-TABS) 100 MG tablet Take 1 tablet (100 mg total) by mouth 2 (two) times daily.  . fluticasone (FLONASE) 50 MCG/ACT nasal spray Place 2 sprays into both nostrils daily.  . [DISCONTINUED] ibuprofen (ADVIL,MOTRIN) 800 MG tablet Take 1 tablet (800 mg  total) by mouth every 8 (eight) hours as needed for mild pain or moderate pain.  . [DISCONTINUED] Ibuprofen-Famotidine (DUEXIS) 800-26.6 MG TABS Take 1 tablet by mouth 3 (three) times daily.  . [DISCONTINUED] NONFORMULARY OR COMPOUNDED ITEM Apply 1-2 g topically 2 (two) times daily.   Facility-Administered Encounter Medications as of 03/06/2017  Medication  . betamethasone acetate-betamethasone sodium phosphate (CELESTONE) injection 3 mg    Review of Systems  Constitutional: Negative for appetite change and unexpected weight change.  HENT: Positive for congestion, postnasal drip and sinus pressure.   Respiratory: Negative for cough, chest tightness and shortness of breath.   Cardiovascular: Negative for chest pain, palpitations and leg swelling.    Gastrointestinal: Negative for abdominal pain, diarrhea, nausea and vomiting.  Genitourinary: Negative for difficulty urinating and dysuria.  Musculoskeletal:       Right hand pain and left thumb pain.  Persistent left knee pain as outlined.    Skin: Negative for color change and rash.  Neurological: Negative for dizziness, light-headedness and headaches.  Psychiatric/Behavioral: Negative for agitation and dysphoric mood.       Objective:    Physical Exam  Constitutional: She appears well-developed and well-nourished. No distress.  HENT:  Mouth/Throat: Oropharynx is clear and moist.  Minimal tenderness to palpation over the sinuses.  Nares - slightly erythematous turbinates.  TMs without erythema.   Neck: Neck supple. No thyromegaly present.  Cardiovascular: Normal rate and regular rhythm.   Pulmonary/Chest: Breath sounds normal. No respiratory distress. She has no wheezes.  Abdominal: Soft. Bowel sounds are normal. There is no tenderness.  Musculoskeletal: She exhibits no edema or tenderness.  Lymphadenopathy:    She has no cervical adenopathy.  Skin: No rash noted. No erythema.  Psychiatric: She has a normal mood and affect. Her behavior is normal.    BP 138/81 (BP Location: Left Arm, Patient Position: Sitting, Cuff Size: Large)   Pulse 100   Temp 98.6 F (37 C) (Oral)   Resp 12   Ht 5\' 2"  (1.575 m)   Wt 200 lb 6.4 oz (90.9 kg)   LMP 10/23/2015 Comment: bleeding since 10/23/15  SpO2 99%   BMI 36.65 kg/m  Wt Readings from Last 3 Encounters:  03/06/17 200 lb 6.4 oz (90.9 kg)  09/04/16 205 lb 6.4 oz (93.2 kg)  07/01/16 175 lb (79.4 kg)     Lab Results  Component Value Date   WBC 10.8 03/14/2016   HGB 9.7 (L) 03/14/2016   HCT 29.1 (L) 03/14/2016   PLT 335 03/14/2016   GLUCOSE 108 (H) 03/14/2016   ALT 25 03/04/2016   AST 24 03/04/2016   NA 139 03/14/2016   K 3.9 03/14/2016   CL 108 03/14/2016   CREATININE 0.66 03/14/2016   BUN 7 03/14/2016   CO2 28  03/14/2016    Mr Ankle Right Wo Contrast  Result Date: 11/21/2016 CLINICAL DATA:  Generalize right ankle pain EXAM: MRI OF THE RIGHT ANKLE WITHOUT CONTRAST TECHNIQUE: Multiplanar, multisequence MR imaging of the ankle was performed. No intravenous contrast was administered. COMPARISON:  None. FINDINGS: TENDONS Peroneal: Peroneal longus tendon intact. Peroneal brevis intact. Posteromedial: Posterior tibial tendon intact. Flexor hallucis longus tendon intact. Flexor digitorum longus tendon intact. Anterior: Tibialis anterior tendon intact. Extensor hallucis longus tendon intact Extensor digitorum longus tendon intact. Achilles: Mild tendinosis of the Achilles tendon with mild peritenonitis. Mild retrocalcaneal bursitis. Severe bone marrow edema at the superior posterior calcaneus at the Achilles insertion with a subtle linear component concerning for a nondisplaced  fracture. Plantar Fascia: Intact. LIGAMENTS Lateral: Anterior talofibular ligament intact. Calcaneofibular ligament intact. Posterior talofibular ligament intact. Anterior and posterior tibiofibular ligaments intact. Medial: Deltoid ligament intact. Spring ligament intact. CARTILAGE Ankle Joint: No joint effusion. Normal ankle mortise. No chondral defect. Subtalar Joints/Sinus Tarsi: Normal subtalar joints. No subtalar joint effusion. Normal sinus tarsi. Bones: No marrow signal abnormality.  No fracture or dislocation. Soft Tissue: No other soft tissue abnormality. No fluid collection or hematoma. IMPRESSION: 1. Mild tendinosis of the Achilles tendon with mild peritenonitis. Mild retrocalcaneal bursitis. Severe bone marrow edema of the superior posterior calcaneus Severe bone marrow edema at the superior posterior calcaneus at the Achilles insertion with a subtle linear component concerning for a nondisplaced fracture. Electronically Signed   By: Kathreen Devoid   On: 11/21/2016 14:57       Assessment & Plan:   Problem List Items Addressed This  Visit    Anemia    s/p surgery.  Follow [facility]bc.       Left knee pain    Saw ortho.  s/p injection.  Still with pain.  Planning to f/u with ortho.        Pain of right heel    Saw podiatry.  Achilles better.  Planning to f/u in two weeks for release.        Status post abdominal hysterectomy    Doing well.  Still followed by gyn.        Other Visit Diagnoses    Right hand pain    -  Primary   persistent pain.  check xray.     Relevant Orders   DG Hand 2 View Right (Completed)   Acute sinusitis, recurrence not specified, unspecified location       Treat with zpak as directed.  saline nasal spray and steroid nasal spray as outlined.  mucinex as directed.  follow.     Relevant Medications   doxycycline (VIBRA-TABS) 100 MG tablet   fluticasone (FLONASE) 50 MCG/ACT nasal spray       Einar Pheasant, MD

## 2017-03-07 ENCOUNTER — Telehealth: Payer: Self-pay

## 2017-03-07 NOTE — Telephone Encounter (Signed)
-----   Message from Einar Pheasant, MD sent at 03/07/2017  5:34 AM EDT ----- Notify pt that xray of hand is ok.  No acute abnormality.  She had requested referral to rheumatology.  Notify her that I will place the order for the referral.

## 2017-03-07 NOTE — Telephone Encounter (Signed)
Left voice mail to call back 

## 2017-03-07 NOTE — Telephone Encounter (Signed)
Patient advised and verbalized understanding 

## 2017-03-09 ENCOUNTER — Encounter: Payer: Self-pay | Admitting: Internal Medicine

## 2017-03-09 DIAGNOSIS — M25562 Pain in left knee: Secondary | ICD-10-CM | POA: Insufficient documentation

## 2017-03-09 NOTE — Assessment & Plan Note (Signed)
Doing well.  Still followed by gyn.

## 2017-03-09 NOTE — Assessment & Plan Note (Signed)
Saw ortho.  s/p injection.  Still with pain.  Planning to f/u with ortho.

## 2017-03-09 NOTE — Assessment & Plan Note (Signed)
s/p surgery.  Follow [facility].

## 2017-03-09 NOTE — Assessment & Plan Note (Signed)
Saw podiatry.  Achilles better.  Planning to f/u in two weeks for release.

## 2017-03-27 ENCOUNTER — Telehealth: Payer: Self-pay | Admitting: Internal Medicine

## 2017-03-27 DIAGNOSIS — M25562 Pain in left knee: Secondary | ICD-10-CM

## 2017-03-27 NOTE — Telephone Encounter (Signed)
Yes, ortho is correct.  She had seen them previously.  Persistent knee pain.

## 2017-03-27 NOTE — Telephone Encounter (Signed)
Pt lvm asking about her referral that was placed 3 wks ago regarding her arthritis. I have sent a referral to [facility] for persistent knee pain. Is that what the referral was for?  Do I need to send her to Rheumatology?

## 2017-03-28 NOTE — Telephone Encounter (Signed)
Patient has requested a return call from [facility]  Pt contact [phone]

## 2017-03-28 NOTE — Telephone Encounter (Signed)
Ok.  Just let me know if I need to do anything more.

## 2017-03-28 NOTE — Telephone Encounter (Signed)
rtc. Had to lvm. Asked her to give [place] a call if she wants to go to rheumatology since ortho was the only referral placed. Asked her to let us know who she would like to see.

## 2017-03-28 NOTE — Telephone Encounter (Signed)
Called [facility]-pt was seen on 3/22 by Marylee Floras. I have lvm for pt to rtc so she can clarify what else she needs.

## 2017-03-31 NOTE — Telephone Encounter (Signed)
Pt called back and states it does not matter which one.   Call pt @ [phone]. Thank you!

## 2017-04-01 NOTE — Telephone Encounter (Signed)
Pt wants to go to rheumatology and she does not care who she is referred to

## 2017-04-02 NOTE — Telephone Encounter (Signed)
Order placed for referral to rheumatology at pts request.

## 2017-04-20 ENCOUNTER — Encounter: Payer: Self-pay | Admitting: Internal Medicine

## 2017-04-20 DIAGNOSIS — Z0000 Encounter for general adult medical examination without abnormal findings: Secondary | ICD-10-CM | POA: Insufficient documentation

## 2017-05-14 DIAGNOSIS — M1380 Other specified arthritis, unspecified site: Secondary | ICD-10-CM | POA: Insufficient documentation

## 2017-05-27 ENCOUNTER — Ambulatory Visit (INDEPENDENT_AMBULATORY_CARE_PROVIDER_SITE_OTHER): Payer: Commercial Managed Care - PPO | Admitting: Family

## 2017-05-27 ENCOUNTER — Encounter: Payer: Self-pay | Admitting: Family

## 2017-05-27 VITALS — BP 136/70 | HR 97 | Temp 98.4°F | Ht 62.0 in | Wt 204.4 lb

## 2017-05-27 DIAGNOSIS — J0100 Acute maxillary sinusitis, unspecified: Secondary | ICD-10-CM | POA: Diagnosis not present

## 2017-05-27 MED ORDER — DOXYCYCLINE HYCLATE 100 MG PO TABS: 100.0000 mg | ORAL_TABLET | Freq: Two times a day (BID) | ORAL | 0 refills | Status: DC

## 2017-05-27 NOTE — Patient Instructions (Signed)
Start doxycycline.  Ensure to take probiotics while on antibiotics and also for 2 weeks after completion. It is important to re-colonize the gut with good bacteria and also to prevent any diarrheal infections associated with antibiotic use.   Plenty of water  Let me know if not better

## 2017-05-27 NOTE — Progress Notes (Signed)
Subjective:    Patient ID: Robin Kelly, female    DOB: January 23, 1966, 51 y.o.   MRN: [id number]  CC: Robin Kelly is a 51 y.o. female who presents today for an acute visit.    HPI: CC: productive cough, sinus pressure x 2 weeks, unchanged.   Endorses HA, sinus pressure,sore throat  No wheezing, sob, fever.   Hasn't tried any OTC medications.    Recently diagnosed with RA and had been on high dose of prednisone- now on 10mg  prednisone. Started on methotrexate.       HISTORY:  Past Medical History:  Diagnosis Date  . Anemia   . Anxiety    due to heavy bleeding and SOB  . Heavy menstrual bleeding   . Seasonal allergies   . Shortness of breath dyspnea    "due to anemia"   Past Surgical History:  Procedure Laterality Date  . ABDOMINAL HYSTERECTOMY    . APPENDECTOMY  2001   right tube and ovary removed also.  Marland Kitchen CESAREAN SECTION     x2  . CHOLECYSTECTOMY    . CYSTOSCOPY N/A 03/12/2016   Procedure: CYSTOSCOPY;  Surgeon: Will Bonnet, MD;  Location: [facility] OR[facility];  Service: Gynecology;  Laterality: N/A;  . DILATATION & CURETTAGE/HYSTEROSCOPY WITH MYOSURE N/A 02/06/2016   Procedure: DILATATION & CURETTAGE HYSTEROSCOPY  polypectomy;  Surgeon: Will Bonnet, MD;  Location: [facility] OR[facility];  Service: Gynecology;  Laterality: N/A;  . POLYPECTOMY N/A 02/06/2016   Procedure: POLYPECTOMY;  Surgeon: Will Bonnet, MD;  Location: [facility] OR[facility];  Service: Gynecology;  Laterality: N/A;  . ROBOTIC ASSISTED TOTAL HYSTERECTOMY WITH SALPINGECTOMY Bilateral 03/12/2016   Converted to total abdominal hysterectom  . SALPINGOOPHORECTOMY Right    during appendectomy.   Family History  Problem Relation Age of Onset  . Hypertension Mother   . Hypertension Father   . Heart failure Father     Allergies: Penicillins and Oxycodone Current Outpatient Prescriptions on File Prior to Visit  Medication Sig Dispense Refill  . citalopram (CELEXA) 10 MG tablet Take 1 tablet (10 mg total) by mouth daily.  90 tablet 0  . fluticasone (FLONASE) 50 MCG/ACT nasal spray Place 2 sprays into both nostrils daily. 16 g 2   Current Facility-Administered Medications on File Prior to Visit  Medication Dose Route Frequency Provider Last Rate Last Dose  . betamethasone acetate-betamethasone sodium phosphate (CELESTONE) injection 3 mg  3 mg Intramuscular Once Edrick Kins, DPM        Social History  Substance Use Topics  . Smoking status: Never Smoker  . Smokeless tobacco: Never Used  . Alcohol use No    Review of Systems  Constitutional: Negative for chills and fever.  HENT: Positive for congestion, sinus pressure and sore throat.   Respiratory: Positive for cough. Negative for shortness of breath and wheezing.   Cardiovascular: Negative for chest pain and palpitations.  Gastrointestinal: Negative for nausea and vomiting.      Objective:    BP 136/70   Pulse 97   Temp 98.4 F (36.9 C) (Oral)   Ht 5\' 2"  (1.575 m)   Wt 204 lb 6.4 oz (92.7 kg)   LMP 10/23/2015 Comment: bleeding since 10/23/15  SpO2 95%   BMI 37.39 kg/m    Physical Exam  Constitutional: She appears well-developed and well-nourished.  HENT:  Head: Normocephalic and atraumatic.  Right Ear: Hearing, tympanic membrane, external ear and ear canal normal. No drainage, swelling or tenderness. No foreign bodies. Tympanic membrane is  not erythematous and not bulging. No middle ear effusion. No decreased hearing is noted.  Left Ear: Hearing, tympanic membrane, external ear and ear canal normal. No drainage, swelling or tenderness. No foreign bodies. Tympanic membrane is not erythematous and not bulging.  No middle ear effusion. No decreased hearing is noted.  Nose: No rhinorrhea. Right sinus exhibits maxillary sinus tenderness. Right sinus exhibits no frontal sinus tenderness. Left sinus exhibits maxillary sinus tenderness. Left sinus exhibits no frontal sinus tenderness.  Mouth/Throat: Uvula is midline, oropharynx is clear and moist  and mucous membranes are normal. No oropharyngeal exudate, posterior oropharyngeal edema, posterior oropharyngeal erythema or tonsillar abscesses.  Eyes: Conjunctivae are normal.  Cardiovascular: Regular rhythm, normal heart sounds and normal pulses.   Pulmonary/Chest: Effort normal and breath sounds normal. She has no wheezes. She has no rhonchi. She has no rales.  Lymphadenopathy:       Head (right side): No submental, no submandibular, no tonsillar, no preauricular, no posterior auricular and no occipital adenopathy present.       Head (left side): No submental, no submandibular, no tonsillar, no preauricular, no posterior auricular and no occipital adenopathy present.    She has no cervical adenopathy.  Neurological: She is alert.  Skin: Skin is warm and dry.  Psychiatric: She has a normal mood and affect. Her speech is normal and behavior is normal. Thought content normal.  Vitals reviewed.      Assessment & Plan:   1. Acute non-recurrent maxillary sinusitis Afebrile. Patient is well-appearing. Based on duration of symptoms, we jointly agreed  reasonable to start antibiotic. Probiotics encouraged. Return precautions given. - doxycycline (VIBRA-TABS) 100 MG tablet; Take 1 tablet (100 mg total) by mouth 2 (two) times daily.  Dispense: 10 tablet; Refill: 0    I have discontinued Ms. Gerety's cetirizine and doxycycline. I am also having her start on doxycycline. Additionally, I am having her maintain her citalopram, fluticasone, predniSONE, and methotrexate. We will continue to administer betamethasone acetate-betamethasone sodium phosphate.   Meds ordered this encounter  Medications  . predniSONE (DELTASONE) 20 MG tablet    Sig: Take 20 mg by mouth daily with breakfast.  . methotrexate (RHEUMATREX) 2.5 MG tablet    Sig: Take 7.5 mg by mouth 3 (three) times a week.  . doxycycline (VIBRA-TABS) 100 MG tablet    Sig: Take 1 tablet (100 mg total) by mouth 2 (two) times daily.    Dispense:   10 tablet    Refill:  0    Order Specific Question:   Supervising Provider    Answer:   Crecencio Mc [[id number]]    Return precautions given.   Risks, benefits, and alternatives of the medications and treatment plan prescribed today were discussed, and patient expressed understanding.   Education regarding symptom management and diagnosis given to patient on AVS.  Continue to follow with Einar Pheasant, MD for routine health maintenance.   Rowan Blase and I agreed with plan.   Mable Paris, FNP

## 2017-06-02 ENCOUNTER — Ambulatory Visit: Payer: Commercial Managed Care - PPO | Admitting: Family

## 2017-10-06 ENCOUNTER — Ambulatory Visit: Payer: Commercial Managed Care - PPO | Admitting: Hematology and Oncology

## 2017-10-13 ENCOUNTER — Inpatient Hospital Stay: Payer: Commercial Managed Care - PPO | Admitting: Hematology and Oncology

## 2017-10-13 DIAGNOSIS — D75839 Thrombocytosis, unspecified: Secondary | ICD-10-CM | POA: Insufficient documentation

## 2017-10-13 DIAGNOSIS — D473 Essential (hemorrhagic) thrombocythemia: Secondary | ICD-10-CM | POA: Insufficient documentation

## 2017-10-13 NOTE — Progress Notes (Deleted)
[facility] day:  10/13/2017  Chief Complaint: Robin Kelly is a 51 y.o. female with thrombocytosis who is referred in consultation by Dr. Marlowe Sax for assessment and management.  HPI:  The patient is followed in the rheumatology clinic at [facility] for seronegative, CCP negative rheumatoid arthritis. She is on methotrexate. She has an elvated CRP (10.4 - 19). Notes indicate she has had an elevated platelet for past 15 years. She is on aspirin 81 mg a day.  Review of available platelet counts dating back to 04/16/2017 reveal a platelet count from 402,0000 - 548,000.  Platelelet count was 402,0000 on 05/14/2017 when her CRP was 18 (normal).CBC on 09/23/2017 revealed a hematocrit of 41.2, hemoglobin 13.7, MCV 97.6, platelets 514,000, white count 10,000 with an [place] of 5540.Differential included 55% segs, 27% lymphs14%, monocytes, 2% eosinophils and 1% basophils.    Past Medical History:  Diagnosis Date  . Anemia   . Anxiety    due to heavy bleeding and SOB  . Heavy menstrual bleeding   . Seasonal allergies   . Shortness of breath dyspnea    "due to anemia"    Past Surgical History:  Procedure Laterality Date  . ABDOMINAL HYSTERECTOMY    . APPENDECTOMY  2001   right tube and ovary removed also.  Marland Kitchen CESAREAN SECTION     x2  . CHOLECYSTECTOMY    . CYSTOSCOPY N/A 03/12/2016   Performed by Will Bonnet, MD at [facility]  . DILATATION & CURETTAGE HYSTEROSCOPY  polypectomy N/A 02/06/2016   Performed by Will Bonnet, MD at [facility]  . HYSTERECTOMY ABDOMINAL WITH LEFT SALPINGECTOMY Bilateral 03/12/2016   Performed by Will Bonnet, MD at [facility] ORS  . POLYPECTOMY N/A 02/06/2016   Performed by Will Bonnet, MD at [facility]  . ROBOTIC ASSISTED TOTAL HYSTERECTOMY Bilateral 03/12/2016   Performed by Will Bonnet, MD at [facility] ORS  . SALPINGOOPHORECTOMY Right    during appendectomy.    Family History  Problem  Relation Age of Onset  . Hypertension Mother   . Hypertension Father   . Heart failure Father     Social History:  reports that  has never smoked. she has never used smokeless tobacco. She reports that she does not drink alcohol or use drugs.  The patient is accompanied by *** alone today.  Allergies:  Allergies  Allergen Reactions  . Penicillins Swelling  . Oxycodone Palpitations    Current Medications: Current Outpatient Medications  Medication Sig Dispense Refill  . citalopram (CELEXA) 10 MG tablet Take 1 tablet (10 mg total) by mouth daily. 90 tablet 0  . doxycycline (VIBRA-TABS) 100 MG tablet Take 1 tablet (100 mg total) by mouth 2 (two) times daily. 10 tablet 0  . fluticasone (FLONASE) 50 MCG/ACT nasal spray Place 2 sprays into both nostrils daily. 16 g 2  . methotrexate (RHEUMATREX) 2.5 MG tablet Take 7.5 mg by mouth 3 (three) times a week.    . predniSONE (DELTASONE) 20 MG tablet Take 20 mg by mouth daily with breakfast.     Current Facility-Administered Medications  Medication Dose Route Frequency Provider Last Rate Last Dose  . betamethasone acetate-betamethasone sodium phosphate (CELESTONE) injection 3 mg  3 mg Intramuscular Once Edrick Kins, DPM        Review of Systems:  GENERAL:  Feels good.  Active.  No fevers, sweats or weight loss. PERFORMANCE STATUS (ECOG):  *** HEENT:  No visual  changes, runny nose, sore throat, mouth sores or tenderness. Lungs: No shortness of breath or cough.  No hemoptysis. Cardiac:  No chest pain, palpitations, orthopnea, or PND. GI:  No nausea, vomiting, diarrhea, constipation, melena or hematochezia. GU:  No urgency, frequency, dysuria, or hematuria. Musculoskeletal:  No back pain.  No joint pain.  No muscle tenderness. Extremities:  No pain or swelling. Skin:  No rashes or skin changes. Neuro:  No headache, numbness or weakness, balance or coordination issues. Endocrine:  No diabetes, thyroid issues, hot flashes or night  sweats. Psych:  No mood changes, depression or anxiety. Pain:  No focal pain. Review of systems:  All other systems reviewed and found to be negative.  Physical Exam: Last menstrual period 10/23/2015. GENERAL:  Well developed, well nourished, **man sitting comfortably in the exam room in no acute distress. MENTAL STATUS:  Alert and oriented to person, place and time. HEAD:  *** hair.  Normocephalic, atraumatic, face symmetric, no Cushingoid features. EYES:  *** eyes.  Pupils equal round and reactive to light and accomodation.  No conjunctivitis or scleral icterus. ENT:  Oropharynx clear without lesion.  Tongue normal. Mucous membranes moist.  RESPIRATORY:  Clear to auscultation without rales, wheezes or rhonchi. CARDIOVASCULAR:  Regular rate and rhythm without murmur, rub or gallop. ABDOMEN:  Soft, non-tender, with active bowel sounds, and no hepatosplenomegaly.  No masses. SKIN:  No rashes, ulcers or lesions. EXTREMITIES: No edema, no skin discoloration or tenderness.  No palpable cords. LYMPH NODES: No palpable cervical, supraclavicular, axillary or inguinal adenopathy  NEUROLOGICAL: Unremarkable. PSYCH:  Appropriate.   No visits with results within 3 Day(s) from this visit.  Latest known visit with results is:  Abstract on 04/20/2017  Component Date Value Ref Range Status  . HM Pap smear 12/22/2015 ok   Final   negative with negative HPV    Assessment:  Robin Kelly is a 51 y.o. female with chronic thrombocytosis and seronegative, CCP negative rheumatoid arthritis.  Plan: 1.  Discuss thrombocytosis, likely reactive.  Discuss myeloproliferative disorders including CML and ET (essential thrombocytosis).  Discuss checking BCR-ABL for CML (doubt).  In ET, JAK2 + in 60-6%, CALR + in 20-25%, and MPL + 3%.  Doubt PV, although labs complicated by ongoing methotrexate.  Check JAK2 + exon 12-15. 2.  Labs today:  CBC with diff, CRP, ferritin, iron studies, ESR, BCR-ABL, JAK2, exon 12-15,  CALR, MPL. 3.   4.     Lequita Asal, MD  10/13/2017, 4:02 AM

## 2017-10-23 ENCOUNTER — Encounter: Payer: Self-pay | Admitting: Hematology and Oncology

## 2017-11-06 ENCOUNTER — Other Ambulatory Visit: Payer: Self-pay | Admitting: Internal Medicine

## 2017-12-01 ENCOUNTER — Ambulatory Visit: Payer: Self-pay | Admitting: *Deleted

## 2017-12-01 NOTE — Telephone Encounter (Signed)
  Reason for Disposition . All other urine symptoms  Answer Assessment - Initial Assessment Questions 1. SYMPTOM: "What's the main symptom you're concerned about?" (e.g., frequency, incontinence)     frequency 2. ONSET: "When did the  ________  start?"     A week agok 3. PAIN: "Is there any pain?" If so, ask: "How bad is it?" (Scale: 1-10; mild, moderate, severe)     No pain 4. CAUSE: "What do you think is causing the symptoms?"     UTI? 5. OTHER SYMPTOMS: "Do you have any other symptoms?" (e.g., fever, flank pain, blood in urine, pain with urination)     None, just frequency and urgency. Sometimes just a few drops. 6. PREGNANCY: "Is there any chance you are pregnant?" "When was your last menstrual period?"     Hysterectomy a year ago  Protocols used: URINARY SYMPTOMS-A-AH  Pt called c/o urinary symptoms, urgency and frequency. Taking prednisone and methotrexate for arthritis. Home care advice given to patient with verbal understanding. Appointment made.

## 2017-12-04 ENCOUNTER — Ambulatory Visit: Payer: Commercial Managed Care - PPO | Admitting: Internal Medicine

## 2017-12-04 ENCOUNTER — Encounter: Payer: Self-pay | Admitting: Internal Medicine

## 2017-12-04 VITALS — BP 122/78 | HR 74 | Temp 98.4°F | Wt 204.0 lb

## 2017-12-04 DIAGNOSIS — R350 Frequency of micturition: Secondary | ICD-10-CM

## 2017-12-04 DIAGNOSIS — N3000 Acute cystitis without hematuria: Secondary | ICD-10-CM | POA: Diagnosis not present

## 2017-12-04 DIAGNOSIS — R3915 Urgency of urination: Secondary | ICD-10-CM | POA: Diagnosis not present

## 2017-12-04 LAB — POC URINALSYSI DIPSTICK (AUTOMATED)
BILIRUBIN UA: NEGATIVE
Glucose, UA: NEGATIVE
KETONES UA: NEGATIVE
Nitrite, UA: NEGATIVE
PH UA: 6 (ref 5.0–8.0)
SPEC GRAV UA: 1.015 (ref 1.010–1.025)
Urobilinogen, UA: 0.2 E.U./dL

## 2017-12-04 MED ORDER — NITROFURANTOIN MONOHYD MACRO 100 MG PO CAPS: 100.0000 mg | ORAL_CAPSULE | Freq: Two times a day (BID) | ORAL | 0 refills | Status: DC

## 2017-12-04 NOTE — Patient Instructions (Signed)

## 2017-12-04 NOTE — Progress Notes (Signed)
HPI  Pt presents to the clinic today with c/o urinary urgency and frequency. This started 2 weeks ago. She denies dysuria, blood in her urine, fever, chills, nausea or low back pain. She denies abdominal pain or pressure. She has not taken anything OTC for her symptoms.    Review of Systems  Past Medical History:  Diagnosis Date  . Anemia   . Anxiety    due to heavy bleeding and SOB  . Heavy menstrual bleeding   . Seasonal allergies   . Shortness of breath dyspnea    "due to anemia"    Family History  Problem Relation Age of Onset  . Hypertension Mother   . Hypertension Father   . Heart failure Father     Social History   Socioeconomic History  . Marital status: Married    Spouse name: Not on file  . Number of children: Not on file  . Years of education: Not on file  . Highest education level: Not on file  Social Needs  . Financial resource strain: Not on file  . Food insecurity - worry: Not on file  . Food insecurity - inability: Not on file  . Transportation needs - medical: Not on file  . Transportation needs - non-medical: Not on file  Occupational History  . Not on file  Tobacco Use  . Smoking status: Never Smoker  . Smokeless tobacco: Never Used  Substance and Sexual Activity  . Alcohol use: No  . Drug use: No  . Sexual activity: Not on file  Other Topics Concern  . Not on file  Social History Narrative  . Not on file    Allergies  Allergen Reactions  . Penicillins Swelling  . Oxycodone Palpitations     Constitutional: Denies fever, malaise, fatigue, headache or abrupt weight changes.   GU: Pt reports urgency, frequency. Denies dysuria, burning sensation, blood in urine, odor or discharge.   No other specific complaints in a complete review of systems (except as listed in HPI above).    Objective:   Physical Exam  BP 122/78   Pulse 74   Temp 98.4 F (36.9 C) (Oral)   Wt 204 lb (92.5 kg)   LMP 10/23/2015 Comment: bleeding since 10/23/15   SpO2 98%   BMI 37.31 kg/m  Wt Readings from Last 3 Encounters:  12/04/17 204 lb (92.5 kg)  05/27/17 204 lb 6.4 oz (92.7 kg)  03/06/17 200 lb 6.4 oz (90.9 kg)    General: Appears her stated age, well developed, well nourished in NAD. Abdomen: Soft. Normal bowel sounds. No distention or masses noted.  Tender to palpation over the bladder area. No CVA tenderness.       Assessment & Plan:   Urgency, Frequency, secondary to UTI:  Urinalysis: 2+ leuks, 1+ blood Will send urine culture eRx sent if for Macrobid 100 mg BID x 5 days OK to take AZO OTC Drink plenty of fluids  RTC as needed or if symptoms persist. Webb Silversmith, NP

## 2017-12-05 LAB — URINE CULTURE
MICRO NUMBER: 90040739
SPECIMEN QUALITY:: ADEQUATE

## 2017-12-15 ENCOUNTER — Other Ambulatory Visit: Payer: Self-pay

## 2017-12-15 ENCOUNTER — Inpatient Hospital Stay: Payer: Commercial Managed Care - PPO

## 2017-12-15 ENCOUNTER — Encounter: Payer: Self-pay | Admitting: Hematology and Oncology

## 2017-12-15 ENCOUNTER — Inpatient Hospital Stay: Payer: Commercial Managed Care - PPO | Attending: Hematology and Oncology | Admitting: Hematology and Oncology

## 2017-12-15 DIAGNOSIS — M069 Rheumatoid arthritis, unspecified: Secondary | ICD-10-CM

## 2017-12-15 DIAGNOSIS — Z90710 Acquired absence of both cervix and uterus: Secondary | ICD-10-CM | POA: Diagnosis not present

## 2017-12-15 DIAGNOSIS — Z7952 Long term (current) use of systemic steroids: Secondary | ICD-10-CM | POA: Diagnosis not present

## 2017-12-15 DIAGNOSIS — D61818 Other pancytopenia: Secondary | ICD-10-CM | POA: Diagnosis not present

## 2017-12-15 DIAGNOSIS — D473 Essential (hemorrhagic) thrombocythemia: Secondary | ICD-10-CM | POA: Diagnosis not present

## 2017-12-15 DIAGNOSIS — Z7982 Long term (current) use of aspirin: Secondary | ICD-10-CM

## 2017-12-15 DIAGNOSIS — Z880 Allergy status to penicillin: Secondary | ICD-10-CM | POA: Diagnosis not present

## 2017-12-15 DIAGNOSIS — Z885 Allergy status to narcotic agent status: Secondary | ICD-10-CM | POA: Diagnosis not present

## 2017-12-15 DIAGNOSIS — Z8042 Family history of malignant neoplasm of prostate: Secondary | ICD-10-CM

## 2017-12-15 DIAGNOSIS — E611 Iron deficiency: Secondary | ICD-10-CM | POA: Insufficient documentation

## 2017-12-15 DIAGNOSIS — Z79899 Other long term (current) drug therapy: Secondary | ICD-10-CM

## 2017-12-15 DIAGNOSIS — Z808 Family history of malignant neoplasm of other organs or systems: Secondary | ICD-10-CM | POA: Insufficient documentation

## 2017-12-15 DIAGNOSIS — D75839 Thrombocytosis, unspecified: Secondary | ICD-10-CM

## 2017-12-15 LAB — CBC WITH DIFFERENTIAL/PLATELET
BASOS ABS: 0.1 10*3/uL (ref 0–0.1)
Basophils Relative: 1 %
EOS ABS: 0 10*3/uL (ref 0–0.7)
EOS PCT: 0 %
HCT: 40.8 % (ref 35.0–47.0)
Hemoglobin: 14 g/dL (ref 12.0–16.0)
LYMPHS PCT: 13 %
Lymphs Abs: 1.8 10*3/uL (ref 1.0–3.6)
MCH: 32.5 pg (ref 26.0–34.0)
MCHC: 34.3 g/dL (ref 32.0–36.0)
MCV: 94.8 fL (ref 80.0–100.0)
Monocytes Absolute: 1.3 10*3/uL — ABNORMAL HIGH (ref 0.2–0.9)
Monocytes Relative: 9 %
NEUTROS PCT: 77 %
Neutro Abs: 10.7 10*3/uL — ABNORMAL HIGH (ref 1.4–6.5)
PLATELETS: 547 10*3/uL — AB (ref 150–440)
RBC: 4.3 MIL/uL (ref 3.80–5.20)
RDW: 15.1 % — ABNORMAL HIGH (ref 11.5–14.5)
WBC: 13.8 10*3/uL — AB (ref 3.6–11.0)

## 2017-12-15 LAB — IRON AND TIBC
Iron: 17 ug/dL — ABNORMAL LOW (ref 28–170)
SATURATION RATIOS: 6 % — AB (ref 10.4–31.8)
TIBC: 304 ug/dL (ref 250–450)
UIBC: 287 ug/dL

## 2017-12-15 LAB — C-REACTIVE PROTEIN: CRP: 5.1 mg/dL — AB (ref <1.0)

## 2017-12-15 LAB — SEDIMENTATION RATE: Sed Rate: 41 mm/hr — ABNORMAL HIGH (ref 0–30)

## 2017-12-15 LAB — TECHNOLOGIST SMEAR REVIEW

## 2017-12-15 LAB — FERRITIN: Ferritin: 62 ng/mL (ref 11–307)

## 2017-12-15 LAB — URIC ACID: Uric Acid, Serum: 4.6 mg/dL (ref 2.3–6.6)

## 2017-12-15 NOTE — Progress Notes (Signed)
Patient here today as new evaluation regarding thrombocytosis.  Referred by Dr. Berdie Ogren.  Patient accompanied by her daughter today.

## 2017-12-15 NOTE — Progress Notes (Addendum)
[facility] day:  12/15/2017  Chief Complaint: Robin Kelly is a 52 y.o. female with thrombocytosis who is referred in consultation by Dr Marlowe Sax for assessment and management.  HPI:  The patient notes a history of rheumatoid arthritis since 03/2017.  She has seronegative, CCP negative disease.  Disease is predominantly in her hands, left knee and right ankle.  She takes weekly MTX.   She was on aspirin 81 mg x 20 years.  She is now taking aspirin 325 mg with improvement in symptoms.  The patient notes a prolonged bleeding course leading up to her hysterectomy. She has never received a blood transfusion. She was on oral iron supplementation for about 6 months back in 2016. She states, "I had a D&C and something else before they did a hysterectomy.  I bled for 5 months before the did the hysterectomy".   CBC on 09/23/2017 revealed a hematocrit of 41.2, hemoglobin 13.7, MCV 97.6, platelets 514,000, WBC 10,000 with an [place] of 5540.  Differential included 55% segs, 27% lymphs, 14% monocytes, 2% eosinophils, and 1% basophils.  CRP was 10.4 (0-4.9).  She denies any family history for any type of hematologic disorders. Father has a history of melanoma and prostate cancer.   Review of prior CBCs reveal a platelet count of 612,000 on 01/30/2016 and 335,000 on 03/14/2016.  At that time, she was hospitalized at [facility] for laparoscopic hysterectomy.  Platelet count was 548,000 on 04/16/2017, 402,000 on 05/14/2017, and 460,000 on 07/02/2017.  Symptomatically, she is doing well overall. She has polyarthralgia associated with her RA.  She states, "I think I am getting a sinus infection". She has a cough, runny nose, and sore throat. Three weeks ago, she had a UTI.  She denies issues with chronic infections.  She has never had a colonoscopy.  She has not had a mammogram in 2 years (last mammogram in [facility] 03/10/2008).   Past Medical History:   Diagnosis Date  . Allergy   . Anemia   . Anxiety    due to heavy bleeding and SOB  . Arthritis   . Heavy menstrual bleeding   . Seasonal allergies   . Shortness of breath dyspnea    "due to anemia"    Past Surgical History:  Procedure Laterality Date  . ABDOMINAL HYSTERECTOMY    . APPENDECTOMY  2001   right tube and ovary removed also.  Marland Kitchen CESAREAN SECTION     x2  . CHOLECYSTECTOMY    . CYSTOSCOPY N/A 03/12/2016   Procedure: CYSTOSCOPY;  Surgeon: Will Bonnet, MD;  Location: [facility] ORS;  Service: Gynecology;  Laterality: N/A;  . DILATATION & CURETTAGE/HYSTEROSCOPY WITH MYOSURE N/A 02/06/2016   Procedure: DILATATION & CURETTAGE HYSTEROSCOPY  polypectomy;  Surgeon: Will Bonnet, MD;  Location: [facility] ORS;  Service: Gynecology;  Laterality: N/A;  . POLYPECTOMY N/A 02/06/2016   Procedure: POLYPECTOMY;  Surgeon: Will Bonnet, MD;  Location: [facility] ORS;  Service: Gynecology;  Laterality: N/A;  . ROBOTIC ASSISTED TOTAL HYSTERECTOMY WITH SALPINGECTOMY Bilateral 03/12/2016   Converted to total abdominal hysterectom  . SALPINGOOPHORECTOMY Right    during appendectomy.    Family History  Problem Relation Age of Onset  . Hypertension Mother   . Hypertension Father   . Heart failure Father   . Cancer Father     Social History:  reports that  has never smoked. she has never used smokeless tobacco. She reports that she does not  drink alcohol or use drugs.  She lives in [place].  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  The patient is accompanied by her youngest daughter Robin Kelly) today.  Allergies:  Allergies  Allergen Reactions  . Penicillins Swelling  . Oxycodone Palpitations    Current Medications: Current Outpatient Medications  Medication Sig Dispense Refill  . aspirin 325 MG tablet Take 325 mg by mouth daily.    . citalopram (CELEXA) 10 MG tablet TAKE 1 TABLET (10 MG TOTAL) BY MOUTH DAILY. 90 tablet 0  . folic acid (FOLVITE)  1 MG tablet Take 1 mg by mouth daily.    . methotrexate (RHEUMATREX) 2.5 MG tablet Take 20 mg by mouth 3 (three) times a week.     . predniSONE (DELTASONE) 20 MG tablet Take 20 mg by mouth daily with breakfast.    . doxycycline (VIBRA-TABS) 100 MG tablet Take 1 tablet (100 mg total) by mouth 2 (two) times daily. 20 tablet 0   Current Facility-Administered Medications  Medication Dose Route Frequency Provider Last Rate Last Dose  . betamethasone acetate-betamethasone sodium phosphate (CELESTONE) injection 3 mg  3 mg Intramuscular Once Edrick Kins, DPM        Review of Systems:  GENERAL:  Feels "good".  No fevers, sweats or weight loss. PERFORMANCE STATUS (ECOG):  1 HEENT:  Sinus congestion.  No visual changes, runny nose, sore throat, mouth sores or tenderness. Lungs: No shortness of breath.  Cough secondary to drainage x 4 days.  No hemoptysis. Cardiac:  No chest pain, palpitations, orthopnea, or PND. Breasts:  No mammogram for 2 years. GI:  No nausea, vomiting, diarrhea, constipation, melena or hematochezia.  No prior colonoscopy. GU:  No urgency, frequency, dysuria, or hematuria. Musculoskeletal:  No back pain.  Joint pain secondary to RA.  No muscle tenderness. Extremities:  No pain or swelling. Skin:  No rashes or skin changes. Neuro:  No headache, numbness or weakness, balance or coordination issues. Endocrine:  No diabetes, thyroid issues, hot flashes or night sweats. Psych:  No mood changes, depression or anxiety. Pain:  No focal pain. Review of systems:  All other systems reviewed and found to be negative.  Physical Exam: Last menstrual period 10/23/2015. GENERAL:  Well developed, well nourished, woman sitting comfortably in the exam room in no acute distress. MENTAL STATUS:  Alert and oriented to person, place and time. HEAD:  Long brown hair.  Normocephalic, atraumatic, face symmetric, no Cushingoid features. EYES:  Glasses. Blue eyes.  Pupils equal round and reactive to  light and accomodation.  No conjunctivitis or scleral icterus. ENT:  Oropharynx clear without lesion.  Tongue normal. Mucous membranes moist.  RESPIRATORY:  Clear to auscultation without rales, wheezes or rhonchi. CARDIOVASCULAR:  Regular rate and rhythm without murmur, rub or gallop. ABDOMEN:  Soft, non-tender, with active bowel sounds, and no appreciable hepatosplenomegaly.  No masses. SKIN:  No rashes, ulcers or lesions. EXTREMITIES: No edema, no skin discoloration or tenderness.  No palpable cords. LYMPH NODES: No palpable cervical, supraclavicular, axillary or inguinal adenopathy  NEUROLOGICAL: Unremarkable. PSYCH:  Appropriate.   Office Visit on 12/15/2017  Component Date Value Ref Range Status  . Uric Acid, Serum 12/15/2017 4.6  2.3 - 6.6 mg/dL Final   Performed at [facility], [address] [place], [place] [address]  . Tech Review 12/15/2017 POLYCHROMASIA PRESENT   Final   Comment: ATYPICAL LYMPHOCYTES GIANT PLATELETS SEEN MIXED RBC POPULATION Performed at [facility],  [address], [place], [place] [address]   . Rhuematoid fact SerPl-aCnc 12/15/2017 10.5  0.0 - 13.9 IU/mL Final   Comment: (NOTE) Performed At: [facility] [place], [place] [id number] Rush Farmer MD FG:[id number] Performed at [facility], [address] [place], [place] [address]   . WBC 12/15/2017 13.8* 3.6 - 11.0 K/uL Final  . RBC 12/15/2017 4.30  3.80 - 5.20 MIL/uL Final  . Hemoglobin 12/15/2017 14.0  12.0 - 16.0 g/dL Final  . HCT 12/15/2017 40.8  35.0 - 47.0 % Final  . MCV 12/15/2017 94.8  80.0 - 100.0 fL Final  . MCH 12/15/2017 32.5  26.0 - 34.0 pg Final  . MCHC 12/15/2017 34.3  32.0 - 36.0 g/dL Final  . RDW 12/15/2017 15.1* 11.5 - 14.5 % Final  . Platelets 12/15/2017 547* 150 - 440 K/uL Final  . Neutrophils Relative % 12/15/2017 77  % Final  . Neutro Abs 12/15/2017 10.7* 1.4 - 6.5 K/uL Final  . Lymphocytes Relative 12/15/2017 13  %  Final  . Lymphs Abs 12/15/2017 1.8  1.0 - 3.6 K/uL Final  . Monocytes Relative 12/15/2017 9  % Final  . Monocytes Absolute 12/15/2017 1.3* 0.2 - 0.9 K/uL Final  . Eosinophils Relative 12/15/2017 0  % Final  . Eosinophils Absolute 12/15/2017 0.0  0 - 0.7 K/uL Final  . Basophils Relative 12/15/2017 1  % Final  . Basophils Absolute 12/15/2017 0.1  0 - 0.1 K/uL Final   Performed at [facility], [address], [place], [place] [address]  . CRP 12/15/2017 5.1* <1.0 mg/dL Final   Performed at [facility] [address] [place], [place] [address]  . Iron 12/15/2017 17* 28 - 170 ug/dL Final  . TIBC 12/15/2017 304  250 - 450 ug/dL Final  . Saturation Ratios 12/15/2017 6* 10.4 - 31.8 % Final  . UIBC 12/15/2017 287  ug/dL Final   Performed at [facility], [address] [place], [place] [address]  . Ferritin 12/15/2017 62  11 - 307 ng/mL Final   Performed at [facility], [address] [place], [place] [address]  . Sed Rate 12/15/2017 41* 0 - 30 mm/hr Final   Performed at [facility], [address] [place], [place] [address]    Assessment:  Robin Kelly is a 52 y.o. female with  seronegative, CCP negative disease rheumatoid arthritis and likely reactive thrombocytosis.  Platelet count has ranged between 335,000 and 612,000 without trend since 01/2016.  Platelet count has ranged between 402,000 - 548,000 in the past year.  She has no evidence of iron deficiency.  She denies recurrent infections.  She has never had a colonoscopy.  Last mammogram was > 2 years ago.  Symptomatically, she notes joint pain.  Exam reveals no appreciable hepatosplenomegaly.  Plan: 1.  Labs today: CBC with diff, ferritin, iron studies, ESR, CRP, rheumatoid factor, uric acid, JAK2 [id number] with reflex to CALR/MPL. 2.  Peripheral smear for path review. 3.  Discuss likely diagnosis of reactive thrombocytosis secondary to inflammation.  Doubt myeloproliferative  disorder. 4.  Schedule limited abdominal ultrasound to evaluate spleen size. 5.  Discuss need for colonoscopy and mammogram. 6.  RTC in 1 week for MD assessment and review of work-up.    Honor Loh, NP  12/15/2017, 3:45 PM   I saw and evaluated the patient, participating in the key portions of the service and reviewing pertinent diagnostic studies and records.  I reviewed  the nurse practitioner's note and agree with the findings and the plan.  The assessment and plan were discussed with the patient.  Several questions were asked by the patient and answered.   Nolon Stalls, MD 12/15/2017, 3:45 PM

## 2017-12-16 ENCOUNTER — Telehealth: Payer: Self-pay | Admitting: Internal Medicine

## 2017-12-16 ENCOUNTER — Ambulatory Visit: Payer: Commercial Managed Care - PPO | Admitting: Internal Medicine

## 2017-12-16 VITALS — BP 124/80 | HR 77 | Temp 98.2°F | Wt 205.0 lb

## 2017-12-16 DIAGNOSIS — J0100 Acute maxillary sinusitis, unspecified: Secondary | ICD-10-CM | POA: Diagnosis not present

## 2017-12-16 LAB — RHEUMATOID FACTOR: RHEUMATOID FACTOR: 10.5 [IU]/mL (ref 0.0–13.9)

## 2017-12-16 MED ORDER — DOXYCYCLINE HYCLATE 100 MG PO TABS: 100.0000 mg | ORAL_TABLET | Freq: Two times a day (BID) | ORAL | 0 refills | Status: DC

## 2017-12-16 NOTE — Telephone Encounter (Signed)
Pt would like to change PCP from [facility] to [facility] at [facility]. Pt says she prefers [facility] location as location is easy to drive to and availability has seemed more open when needed. Please advise.

## 2017-12-16 NOTE — Telephone Encounter (Signed)
Ok with me 

## 2017-12-16 NOTE — Progress Notes (Signed)
HPI  Pt presents to the clinic today with c/o headache, facial pain and pressure, nasal congestion and sore throat. This started 2 weeks ago. She is blowing blood tinged nasal mucous out of her nose. She denies difficulty swallowing. She denies fever, but has had intermittent chills and body aches. She has not tried anything OTC. She does have a history of allergies. She has not had sick contacts that she is aware of.  Review of Systems     Past Medical History:  Diagnosis Date  . Allergy   . Anemia   . Anxiety    due to heavy bleeding and SOB  . Arthritis   . Heavy menstrual bleeding   . Seasonal allergies   . Shortness of breath dyspnea    "due to anemia"    Family History  Problem Relation Age of Onset  . Hypertension Mother   . Hypertension Father   . Heart failure Father   . Cancer Father     Social History   Socioeconomic History  . Marital status: Married    Spouse name: Not on file  . Number of children: Not on file  . Years of education: Not on file  . Highest education level: Not on file  Social Needs  . Financial resource strain: Not on file  . Food insecurity - worry: Not on file  . Food insecurity - inability: Not on file  . Transportation needs - medical: Not on file  . Transportation needs - non-medical: Not on file  Occupational History  . Not on file  Tobacco Use  . Smoking status: Never Smoker  . Smokeless tobacco: Never Used  Substance and Sexual Activity  . Alcohol use: No  . Drug use: No  . Sexual activity: Not on file  Other Topics Concern  . Not on file  Social History Narrative  . Not on file    Allergies  Allergen Reactions  . Penicillins Swelling  . Oxycodone Palpitations     Constitutional: Positive headache, fatigue. Denies fever or abrupt weight changes.  HEENT:  Positive facial pain, nasal congestion and sore throat. Denies eye redness, ear pain, ringing in the ears, wax buildup, runny nose or bloody nose. Respiratory:  Denies cough, difficulty breathing or shortness of breath.  Cardiovascular: Denies chest pain, chest tightness, palpitations or swelling in the hands or feet.   No other specific complaints in a complete review of systems (except as listed in HPI above).  Objective:   BP 124/80   Pulse 77   Temp 98.2 F (36.8 C) (Oral)   Wt 205 lb (93 kg)   LMP 10/23/2015 Comment: bleeding since 10/23/15  SpO2 98%   BMI 37.49 kg/m   General: Appears her stated age, in NAD. HEENT: Head: normal shape and size, maxillary sinus tenderness noted;Ears: Tm's gray and intact, normal light reflex; Nose: mucosa boggy and moist; Throat/Mouth: + PND. Teeth present, mucosa erythematous and moist, no exudate noted, no lesions or ulcerations noted.  Neck:  No adenopathy noted.  Cardiovascular: Normal rate and rhythm. S1,S2 noted.  No murmur, rubs or gallops noted.  Pulmonary/Chest: Normal effort and positive vesicular breath sounds. No respiratory distress. No wheezes, rales or ronchi noted.       Assessment & Plan:   Acute Maxillary Sinusitis  Can use a Neti Pot which can be purchased from your local drug store. Flonase 2 sprays each nostril for 3 days and then as needed. eRx for Doxyclicine BID for 10 days  RTC as needed or if symptoms persist. Webb Silversmith, NP

## 2017-12-16 NOTE — Patient Instructions (Signed)

## 2017-12-17 ENCOUNTER — Encounter: Payer: Self-pay | Admitting: Internal Medicine

## 2017-12-17 ENCOUNTER — Ambulatory Visit: Payer: Commercial Managed Care - PPO

## 2017-12-17 NOTE — Telephone Encounter (Signed)
Fine with me

## 2017-12-17 NOTE — Telephone Encounter (Signed)
Called pt left message to ret call to schedule new pt appt with Webb Silversmith at [facility]. OK per Dr Nicki Reaper and Rollene Fare per piror messages.

## 2017-12-19 ENCOUNTER — Ambulatory Visit
Admission: RE | Admit: 2017-12-19 | Discharge: 2017-12-19 | Disposition: A | Discharge status: Home / Self Care | Payer: Commercial Managed Care - PPO | Source: Ambulatory Visit | Attending: Urgent Care | Admitting: Urgent Care

## 2017-12-19 DIAGNOSIS — D61818 Other pancytopenia: Secondary | ICD-10-CM | POA: Diagnosis not present

## 2017-12-24 ENCOUNTER — Inpatient Hospital Stay: Payer: Commercial Managed Care - PPO | Admitting: Hematology and Oncology

## 2017-12-24 ENCOUNTER — Encounter: Payer: Self-pay | Admitting: Hematology and Oncology

## 2017-12-24 VITALS — BP 124/83 | HR 98 | Temp 98.4°F | Wt 203.5 lb

## 2017-12-24 DIAGNOSIS — Z79899 Other long term (current) drug therapy: Secondary | ICD-10-CM

## 2017-12-24 DIAGNOSIS — Z90710 Acquired absence of both cervix and uterus: Secondary | ICD-10-CM | POA: Diagnosis not present

## 2017-12-24 DIAGNOSIS — D473 Essential (hemorrhagic) thrombocythemia: Secondary | ICD-10-CM | POA: Diagnosis not present

## 2017-12-24 DIAGNOSIS — E611 Iron deficiency: Secondary | ICD-10-CM

## 2017-12-24 DIAGNOSIS — Z885 Allergy status to narcotic agent status: Secondary | ICD-10-CM | POA: Diagnosis not present

## 2017-12-24 DIAGNOSIS — Z8042 Family history of malignant neoplasm of prostate: Secondary | ICD-10-CM | POA: Diagnosis not present

## 2017-12-24 DIAGNOSIS — Z880 Allergy status to penicillin: Secondary | ICD-10-CM

## 2017-12-24 DIAGNOSIS — Z7952 Long term (current) use of systemic steroids: Secondary | ICD-10-CM | POA: Diagnosis not present

## 2017-12-24 DIAGNOSIS — D75839 Thrombocytosis, unspecified: Secondary | ICD-10-CM

## 2017-12-24 DIAGNOSIS — M069 Rheumatoid arthritis, unspecified: Secondary | ICD-10-CM

## 2017-12-24 DIAGNOSIS — Z7982 Long term (current) use of aspirin: Secondary | ICD-10-CM | POA: Diagnosis not present

## 2017-12-24 LAB — CALR + JAK2 E12-15 + MPL (REFLEXED)

## 2017-12-24 LAB — JAK2 V617F, W REFLEX TO CALR/E12/MPL

## 2017-12-24 NOTE — Progress Notes (Signed)
Patient here today for [place] results.  Complains about arthritis pain in her right index finger and right ankle.

## 2017-12-24 NOTE — Progress Notes (Signed)
[facility] day:  12/24/2017   Chief Complaint: Robin Kelly is a 52 y.o. female with rheumatoid arthritis and thrombocytosis who is seen for review of work-up and discussion regarding direction of therapy.  HPI:  The patient was last seen in the hematology clinic on 12/15/2017 for initial consultation.  She was noted to have thrombocytosis dating back to 01/2016.  Platelet count fluctuated without trend.  She was felt most likely to have reactive thrombocytosis.  She underwent a work-up.  CBC revealed a hematocrit of 40.8, hemoglobin 14.0, platelets 547,000, WBC 13,800 with an ANC of 10,700.  Differential included 77% segs, 13% lymphs, and 9% monocytes.  RF was negative.  Uric acid was 4.6.  CRP was 5.1 (< 1).  Sed rate was 41 (0-30).  Ferritin was 62.  Iron studies revealed a saturation of 6% (low) and TIBC of 304.  JAK2 is pending.  Peripheral smear revealed a mixed RBC population, polychromasia, atypical lymphocytes, and giant platelets.   Abdominal ultrasound on 12/19/2017 revealed a normal spleen.  During the interim, she denies any new complaints.  She has significant arthritis pain in her right index finger.  She also has pain in her right ankle.  She notes a stomach bug a few weeks ago with associated nausea and vomiting.  She notes that her left knee is "ok" today.  She continues to have a right foot problem.  She took iron after her hysterectomy.   Past Medical History:  Diagnosis Date  . Allergy   . Anemia   . Anxiety    due to heavy bleeding and SOB  . Arthritis   . Heavy menstrual bleeding   . Seasonal allergies   . Shortness of breath dyspnea    "due to anemia"    Past Surgical History:  Procedure Laterality Date  . ABDOMINAL HYSTERECTOMY    . APPENDECTOMY  2001   right tube and ovary removed also.  Marland Kitchen CESAREAN SECTION     x2  . CHOLECYSTECTOMY    . CYSTOSCOPY N/A 03/12/2016   Procedure: CYSTOSCOPY;  Surgeon: Will Bonnet, MD;  Location: [facility];  Service: Gynecology;  Laterality: N/A;  . DILATATION & CURETTAGE/HYSTEROSCOPY WITH MYOSURE N/A 02/06/2016   Procedure: DILATATION & CURETTAGE HYSTEROSCOPY  polypectomy;  Surgeon: Will Bonnet, MD;  Location: [facility];  Service: Gynecology;  Laterality: N/A;  . POLYPECTOMY N/A 02/06/2016   Procedure: POLYPECTOMY;  Surgeon: Will Bonnet, MD;  Location: [facility];  Service: Gynecology;  Laterality: N/A;  . ROBOTIC ASSISTED TOTAL HYSTERECTOMY WITH SALPINGECTOMY Bilateral 03/12/2016   Converted to total abdominal hysterectom  . SALPINGOOPHORECTOMY Right    during appendectomy.    Family History  Problem Relation Age of Onset  . Hypertension Mother   . Hypertension Father   . Heart failure Father   . Cancer Father     Social History:  reports that  has never smoked. she has never used smokeless tobacco. She reports that she does not drink alcohol or use drugs.  She lives in [place].  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  The patient is accompanied by her daughter, Robin Kelly, today.  Allergies:  Allergies  Allergen Reactions  . Penicillins Swelling  . Oxycodone Palpitations    Current Medications: Current Outpatient Medications  Medication Sig Dispense Refill  . aspirin 325 MG tablet Take 325 mg by mouth daily.    . citalopram (  CELEXA) 10 MG tablet TAKE 1 TABLET (10 MG TOTAL) BY MOUTH DAILY. 90 tablet 0  . doxycycline (VIBRA-TABS) 100 MG tablet Take 1 tablet (100 mg total) by mouth 2 (two) times daily. 20 tablet 0  . folic acid (FOLVITE) 1 MG tablet Take 1 mg by mouth daily.    . methotrexate (RHEUMATREX) 2.5 MG tablet Take 20 mg by mouth 3 (three) times a week.     . predniSONE (DELTASONE) 20 MG tablet Take 20 mg by mouth daily with breakfast.     Current Facility-Administered Medications  Medication Dose Route Frequency Provider Last Rate Last Dose  . betamethasone acetate-betamethasone sodium  phosphate (CELESTONE) injection 3 mg  3 mg Intramuscular Once Edrick Kins, DPM        Review of Systems:  GENERAL:  Feels "ok".  No fevers or sweats.  Weight down 2 pounds since last visit. PERFORMANCE STATUS (ECOG):  1 HEENT:  No visual changes, runny nose, sore throat, mouth sores or tenderness. Lungs: No shortness of breath.  Cough secondary to drainage.  No hemoptysis. Cardiac:  No chest pain, palpitations, orthopnea, or PND. Breasts:  No mammogram for 2 years. GI:  No nausea, vomiting, diarrhea, constipation, melena or hematochezia.  No prior colonoscopy. GU:  No urgency, frequency, dysuria, or hematuria. Musculoskeletal:  No back pain.  Joint pain in right index finger secondary to RA.  Right foot problems.  No muscle tenderness. Extremities:  No pain or swelling. Skin:  No rashes or skin changes. Neuro:  No headache, numbness or weakness, balance or coordination issues. Endocrine:  No diabetes, thyroid issues, hot flashes or night sweats. Psych:  No mood changes, depression or anxiety. Pain:  Right index finger pain (8 out of 10). Review of systems:  All other systems reviewed and found to be negative.  Physical Exam: Last menstrual period 10/23/2015. GENERAL:  Well developed, well nourished, woman sitting comfortably in the exam room in no acute distress. MENTAL STATUS:  Alert and oriented to person, place and time. HEAD:  Long brown hair pulled back.  Normocephalic, atraumatic, face symmetric, no Cushingoid features. EYES:  Glasses. Blue eyes.  No conjunctivitis or scleral icterus. EXTREMITIES: Right second PIP edematous.  No lower extremity edema, no skin discoloration or tenderness.  NEUROLOGICAL: Unremarkable. PSYCH:  Appropriate.   No visits with results within 3 Day(s) from this visit.  Latest known visit with results is:  Office Visit on 12/15/2017  Component Date Value Ref Range Status  . Uric Acid, Serum 12/15/2017 4.6  2.3 - 6.6 mg/dL Final   Performed at  [facility], [address] [place], [place] [address]  . Tech Review 12/15/2017 POLYCHROMASIA PRESENT   Final   Comment: ATYPICAL LYMPHOCYTES GIANT PLATELETS SEEN MIXED RBC POPULATION Performed at [facility], [address] [place], [place] [address]   . Rhuematoid fact SerPl-aCnc 12/15/2017 10.5  0.0 - 13.9 IU/mL Final   Comment: (NOTE) Performed At: [facility] [place], [place] [id number] Rush Farmer MD ME:[id number] Performed at [facility], [address], [place], [place] [address]   . WBC 12/15/2017 13.8* 3.6 - 11.0 K/uL Final  . RBC 12/15/2017 4.30  3.80 - 5.20 MIL/uL Final  . Hemoglobin 12/15/2017 14.0  12.0 - 16.0 g/dL Final  . HCT 12/15/2017 40.8  35.0 - 47.0 % Final  . MCV 12/15/2017 94.8  80.0 - 100.0 fL Final  . MCH 12/15/2017 32.5  26.0 - 34.0 pg Final  . MCHC  12/15/2017 34.3  32.0 - 36.0 g/dL Final  . RDW 12/15/2017 15.1* 11.5 - 14.5 % Final  . Platelets 12/15/2017 547* 150 - 440 K/uL Final  . Neutrophils Relative % 12/15/2017 77  % Final  . Neutro Abs 12/15/2017 10.7* 1.4 - 6.5 K/uL Final  . Lymphocytes Relative 12/15/2017 13  % Final  . Lymphs Abs 12/15/2017 1.8  1.0 - 3.6 K/uL Final  . Monocytes Relative 12/15/2017 9  % Final  . Monocytes Absolute 12/15/2017 1.3* 0.2 - 0.9 K/uL Final  . Eosinophils Relative 12/15/2017 0  % Final  . Eosinophils Absolute 12/15/2017 0.0  0 - 0.7 K/uL Final  . Basophils Relative 12/15/2017 1  % Final  . Basophils Absolute 12/15/2017 0.1  0 - 0.1 K/uL Final   Performed at [facility], [address], [place], [place] [address]  . CRP 12/15/2017 5.1* <1.0 mg/dL Final   Performed at [facility] [address], [place], [place] [address]  . Iron 12/15/2017 17* 28 - 170 ug/dL Final  . TIBC 12/15/2017 304  250 - 450 ug/dL Final  . Saturation Ratios 12/15/2017 6* 10.4 - 31.8 % Final  . UIBC 12/15/2017 287  ug/dL Final   Performed at [facility],  [address] [place], [place] [address]  . Ferritin 12/15/2017 62  11 - 307 ng/mL Final   Performed at [facility], [address] [place], [place] [address]  . Sed Rate 12/15/2017 41* 0 - 30 mm/hr Final   Performed at [facility], [address] [place],  [address]    Assessment:  KENYATA NAPIER is a 52 y.o. female with  seronegative, CCP negative disease rheumatoid arthritis and likely reactive thrombocytosis.  Platelet count has ranged between 335,000 and 612,000 without trend since 01/2016.  Platelet count has ranged between 402,000 - 548,000 in the past year.  She has no evidence of iron deficiency.  She denies recurrent infections.  Work-up on 12/15/2017 revealed a hematocrit of 40.8, hemoglobin 14.0, platelets 547,000, WBC 13,800 with an ANC of 10,700.  Differential included 77% segs, 13% lymphs, and 9% monocytes.  CRP (5.1) and sed rate (41) were elevated. Ferritin was 62.  Iron studies revealed a saturation of 6% (low) and TIBC of 304.  JAK2 is pending.  Peripheral smear revealed a mixed RBC population, polychromasia, atypical lymphocytes, and giant platelets.   Abdominal ultrasound on 12/19/2017 revealed a normal spleen.  She has never had a colonoscopy.  Last mammogram was > 2 years ago.  Symptomatically, she notes joint pain.  Exam reveals swollen right second PIP.  She has no appreciable hepatosplenomegaly.  Plan: 1.  Review work-up. Fluctuating platelet count suggests reactive thrombocytosis.  Work-up reassuring to date.  Discuss inflammation (RA) and likely some component of iron deficiency. Await additional studies. 2.  Iron studies appear low with low saturation and ferritin.  Ferritin falsely elevated secondary to inflammation.  Discuss iron supplementation. 3.  Suspect reactive lymphocytes.  No lymphocytosis on peripheral smear.  Follow-up. 4.  Follow-up JAK2 and reflex. Consider BCR-ABL testing if biologic DMARDs planned. 5.  Correlate  ferritin, sed rate, and platelet count with future labs.  If platelet count out of proportion, would consider additional testing. 6.  Encourage colonoscopy and mammogram. 7.  RTC in 6 weeks for labs (CBC with diff, ferritin, sed rate, BCR-ABL). 8.  RTC in 3 months for MD assessment and labs (CBC with diff, ferritin, iron studies).   Nolon Stalls,  MD 12/24/2017, 4:35 PM

## 2017-12-25 ENCOUNTER — Telehealth: Payer: Self-pay | Admitting: *Deleted

## 2017-12-25 NOTE — Telephone Encounter (Signed)
Called patient and LVM that labs were negative yesterday.

## 2017-12-25 NOTE — Telephone Encounter (Signed)
-----   Message from Lequita Asal, MD sent at 12/24/2017  2:09 PM EST ----- Regarding: Please call patient with results  JAK2, CALR, MPL negative.  M  ----- Message ----- From: Karen Kitchens, NP Sent: 12/24/2017   1:11 PM To: Lequita Asal, MD    ----- Message ----- From: Interface, Lab In [place] Sent: 12/15/2017   5:38 PM To: Karen Kitchens, NP

## 2017-12-28 ENCOUNTER — Encounter: Payer: Self-pay | Admitting: Hematology and Oncology

## 2018-02-04 ENCOUNTER — Other Ambulatory Visit: Payer: Commercial Managed Care - PPO

## 2018-02-10 ENCOUNTER — Other Ambulatory Visit: Payer: Self-pay | Admitting: Internal Medicine

## 2018-03-25 ENCOUNTER — Ambulatory Visit: Payer: Commercial Managed Care - PPO | Admitting: Hematology and Oncology

## 2018-03-25 ENCOUNTER — Other Ambulatory Visit: Payer: Commercial Managed Care - PPO

## 2018-04-29 ENCOUNTER — Other Ambulatory Visit: Payer: Commercial Managed Care - PPO

## 2018-04-29 ENCOUNTER — Other Ambulatory Visit: Payer: Self-pay | Admitting: Hematology and Oncology

## 2018-04-29 ENCOUNTER — Ambulatory Visit: Payer: Commercial Managed Care - PPO | Admitting: Hematology and Oncology

## 2018-04-29 DIAGNOSIS — D75839 Thrombocytosis, unspecified: Secondary | ICD-10-CM

## 2018-04-29 DIAGNOSIS — D473 Essential (hemorrhagic) thrombocythemia: Secondary | ICD-10-CM

## 2018-04-29 NOTE — Progress Notes (Deleted)
[facility] day:  04/29/2018   Chief Complaint: Robin Kelly is a 52 y.o. female with rheumatoid arthritis and thrombocytosis who is seen for 3 months assessment.  HPI:  The patient was last seen in the hematology clinic on 12/24/2017.  At that time, she noted joint pain.  Exam revealed swollen right second PIP.  She had no appreciable hepatosplenomegaly.  Fluctuating platelet count suggested reactive thrombocytosis.  Work-up was reassuring.  We discussed inflammation (RA) and likely some component of iron deficiency.  Oral iron was encouraged.     Past Medical History:  Diagnosis Date  . Allergy   . Anemia   . Anxiety    due to heavy bleeding and SOB  . Arthritis   . Heavy menstrual bleeding   . Seasonal allergies   . Shortness of breath dyspnea    "due to anemia"    Past Surgical History:  Procedure Laterality Date  . ABDOMINAL HYSTERECTOMY    . APPENDECTOMY  2001   right tube and ovary removed also.  Marland Kitchen CESAREAN SECTION     x2  . CHOLECYSTECTOMY    . CYSTOSCOPY N/A 03/12/2016   Procedure: CYSTOSCOPY;  Surgeon: Will Bonnet, MD;  Location: [facility] ORS;  Service: Gynecology;  Laterality: N/A;  . DILATATION & CURETTAGE/HYSTEROSCOPY WITH MYOSURE N/A 02/06/2016   Procedure: DILATATION & CURETTAGE HYSTEROSCOPY  polypectomy;  Surgeon: Will Bonnet, MD;  Location: [facility] OR[facility];  Service: Gynecology;  Laterality: N/A;  . POLYPECTOMY N/A 02/06/2016   Procedure: POLYPECTOMY;  Surgeon: Will Bonnet, MD;  Location: [facility] OR[facility];  Service: Gynecology;  Laterality: N/A;  . ROBOTIC ASSISTED TOTAL HYSTERECTOMY WITH SALPINGECTOMY Bilateral 03/12/2016   Converted to total abdominal hysterectom  . SALPINGOOPHORECTOMY Right    during appendectomy.    Family History  Problem Relation Age of Onset  . Hypertension Mother   . Hypertension Father   . Heart failure Father   . Cancer Father     Social History:  reports that she has never smoked.  She has never used smokeless tobacco. She reports that she does not drink alcohol or use drugs.  She lives in [place].  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  The patient is accompanied by her daughter, Robin Kelly, today.  Allergies:  Allergies  Allergen Reactions  . Penicillins Swelling  . Oxycodone Palpitations    Current Medications: Current Outpatient Medications  Medication Sig Dispense Refill  . aspirin 325 MG tablet Take 325 mg by mouth daily.    . citalopram (CELEXA) 10 MG tablet TAKE 1 TABLET BY MOUTH EVERY DAY 90 tablet 0  . doxycycline (VIBRA-TABS) 100 MG tablet Take 1 tablet (100 mg total) by mouth 2 (two) times daily. 20 tablet 0  . folic acid (FOLVITE) 1 MG tablet Take 1 mg by mouth daily.    . methotrexate (RHEUMATREX) 2.5 MG tablet Take 20 mg by mouth 3 (three) times a week.     . predniSONE (DELTASONE) 20 MG tablet Take 20 mg by mouth daily with breakfast.     Current Facility-Administered Medications  Medication Dose Route Frequency Provider Last Rate Last Dose  . betamethasone acetate-betamethasone sodium phosphate (CELESTONE) injection 3 mg  3 mg Intramuscular Once Edrick Kins, DPM        Review of Systems:  GENERAL:  Feels "ok".  No fevers or sweats.  Weight down 2 pounds since last visit. PERFORMANCE STATUS (ECOG):  1 HEENT:  No visual changes, runny nose, sore throat, mouth sores or tenderness. Lungs: No shortness of breath.  Cough secondary to drainage.  No hemoptysis. Cardiac:  No chest pain, palpitations, orthopnea, or PND. Breasts:  No mammogram for 2 years. GI:  No nausea, vomiting, diarrhea, constipation, melena or hematochezia.  No prior colonoscopy. GU:  No urgency, frequency, dysuria, or hematuria. Musculoskeletal:  No back pain.  Joint pain in right index finger secondary to RA.  Right foot problems.  No muscle tenderness. Extremities:  No pain or swelling. Skin:  No rashes or skin changes. Neuro:  No  headache, numbness or weakness, balance or coordination issues. Endocrine:  No diabetes, thyroid issues, hot flashes or night sweats. Psych:  No mood changes, depression or anxiety. Pain:  Right index finger pain (8 out of 10). Review of systems:  All other systems reviewed and found to be negative.  Physical Exam: Last menstrual period 10/23/2015. GENERAL:  Well developed, well nourished, woman sitting comfortably in the exam room in no acute distress. MENTAL STATUS:  Alert and oriented to person, place and time. HEAD:  Long brown hair pulled back.  Normocephalic, atraumatic, face symmetric, no Cushingoid features. EYES:  Glasses. Blue eyes.  No conjunctivitis or scleral icterus. EXTREMITIES: Right second PIP edematous.  No lower extremity edema, no skin discoloration or tenderness.  NEUROLOGICAL: Unremarkable. PSYCH:  Appropriate.  GENERAL:  Well developed, well nourished, woman sitting comfortably in the exam room in no acute distress. MENTAL STATUS:  Alert and oriented to person, place and time. HEAD:  *** hair.  Normocephalic, atraumatic, face symmetric, no Cushingoid features. EYES:  *** eyes.  Pupils equal round and reactive to light and accomodation.  No conjunctivitis or scleral icterus. ENT:  Oropharynx clear without lesion.  Tongue normal. Mucous membranes moist.  RESPIRATORY:  Clear to auscultation without rales, wheezes or rhonchi. CARDIOVASCULAR:  Regular rate and rhythm without murmur, rub or gallop. ABDOMEN:  Soft, non-tender, with active bowel sounds, and no hepatosplenomegaly.  No masses. SKIN:  No rashes, ulcers or lesions. EXTREMITIES: No edema, no skin discoloration or tenderness.  No palpable cords. LYMPH NODES: No palpable cervical, supraclavicular, axillary or inguinal adenopathy  NEUROLOGICAL: Unremarkable. PSYCH:  Appropriate.    No visits with results within 3 Day(s) from this visit.  Latest known visit with results is:  Office Visit on 12/15/2017   Component Date Value Ref Range Status  . Uric Acid, Serum 12/15/2017 4.6  2.3 - 6.6 mg/dL Final   Performed at [facility], [address] [place], [place] [address]  . JAK2 GenotypR 12/15/2017 Comment   Final   Comment: (NOTE) Result: NEGATIVE for the JAK2 V617F mutation. Interpretation:  The G to T nucleotide change encoding the V617F mutation was not detected.  This result does not rule out the presence of the JAK2 mutation at a level below the sensitivity of detection of this assay, or the presence of other mutations within JAK2 not detected by this assay.  This result does not rule out a diagnosis of polycythemia vera, essential thrombocythemia or idiopathic myelofibrosis as the V617F mutation is not detected in all patients with these disorders.   Marland Kitchen BACKGROUND: 12/15/2017 Comment   Final   Comment: (NOTE) JAK2 is a cytoplasmic tyrosine kinase with a key role in signal transduction from multiple hematopoietic growth factor receptors. A point mutation within exon 14 of the JAK2 gene ([id number]) encoding a valine to phenylalanine substitution at position 617 of the JAK2 protein (V617F)  has been identified in most patients with polycythemia vera, and in about half of those with either essential thrombocythemia or idiopathic myelofibrosis. The V617F has also been detected, although infrequently, in other myeloid disorders such as chronic myelomonocytic leukemia and chronic neutrophilic luekemia. V617F is an acquired mutation that alters a highly conserved valine present in the negative regulatory JH2 domain of the JAK2 protein and is predicted to dysregulate kinase activity. Methodology: Total genomic DNA was extracted and subjected to TaqMan real-time PCR amplification/detection. Two amplification products per sample were monitored by real-time PCR using primers/probes s                          pecific to JAK2 wild type (WT) and JAK2 mutant V617F. The ABI7900  Absolute Quantitation software will compare the patient specimen valuse to the standard curves and generate percent values for wild type and mutant type. In vitro studies have indicated that this assay has an analytical sensitivity of 1%. References: Baxter EJ, Scott Phineas Real, et al. Acquired mutation of the tyrosine kinase JAK2 in human myeloproliferative disorders. Lancet. 2005 Mar 19-25; 365(9464):1054-1061. Alfonso Ramus Couedic JP. A unique clonal JAK2 mutation leading to constitutive signaling causes polycythaemia vera. Nature. 2005 Apr 28; 434(7037):1144-1148. Kralovics R, Passamonti F, Buser AS, et al. A gain-of-function mutation of JAK2 in myeloproliferative disorders. N Engl J Med. 2005 Apr 28; 352(17):1779-1790.   . Director Review, JAK2 12/15/2017 Comment   Final   Comment: (NOTE) Constance Goltz, PhD, [facility]               Director, [place] for [place], [place]               [phone] This test was developed and its performance characteristics determined by [facility]. It has not been cleared or approved by the Food and Drug Administration.   Marland Kitchen REFLEX: 12/15/2017 Comment   Final   Comment: (NOTE) Reflex to CALR Mutation Analysis, JAK2 Exon 12-15 Mutation Analysis, and MPL Mutation Analysis is indicated.   Marland Kitchen Extraction 12/15/2017 Completed   Corrected   Comment: (NOTE) Performed At: [facility] [address] [place], [place] [id number] Nechama Guard MD [id number] Performed At: [facility] [place], [place] [id number] Nechama Guard MD [id number] Performed at [facility], [address] [place], [place] [address]   . Tech Review 12/15/2017 POLYCHROMASIA PRESENT   Final   Comment: ATYPICAL LYMPHOCYTES GIANT PLATELETS SEEN MIXED RBC POPULATION Performed at [facility], [address] [place], [place] [address]   . Rhuematoid fact SerPl-aCnc 12/15/2017 10.5  0.0 - 13.9 IU/mL Final   Comment: (NOTE) Performed At: [facility] [place], [place] [id number] Rush Farmer MD [id number] Performed at [facility], [address], [place], [place] [address]   . WBC 12/15/2017 13.8* 3.6 - 11.0 K/uL Final  . RBC 12/15/2017 4.30  3.80 - 5.20 MIL/uL Final  . Hemoglobin 12/15/2017 14.0  12.0 - 16.0 g/dL Final  . HCT 12/15/2017 40.8  35.0 - 47.0 % Final  . MCV 12/15/2017 94.8  80.0 - 100.0 fL Final  . MCH  12/15/2017 32.5  26.0 - 34.0 pg Final  . MCHC 12/15/2017 34.3  32.0 - 36.0 g/dL Final  . RDW 12/15/2017 15.1* 11.5 - 14.5 % Final  . Platelets 12/15/2017 547* 150 - 440 K/uL Final  . Neutrophils Relative % 12/15/2017 77  % Final  . Neutro Abs 12/15/2017 10.7* 1.4 - 6.5 K/uL Final  . Lymphocytes Relative 12/15/2017 13  % Final  . Lymphs Abs 12/15/2017 1.8  1.0 - 3.6 K/uL Final  . Monocytes Relative 12/15/2017 9  % Final  . Monocytes Absolute 12/15/2017 1.3* 0.2 - 0.9 K/uL Final  . Eosinophils Relative 12/15/2017 0  % Final  . Eosinophils Absolute 12/15/2017 0.0  0 - 0.7 K/uL Final  . Basophils Relative 12/15/2017 1  % Final  . Basophils Absolute 12/15/2017 0.1  0 - 0.1 K/uL Final   Performed at [facility], [address] [place], [place] [address]  . CRP 12/15/2017 5.1* <1.0 mg/dL Final   Performed at [facility] [address], [place], [place] [address]  . Iron 12/15/2017 17* 28 - 170 ug/dL Final  . TIBC 12/15/2017 304  250 - 450 ug/dL Final  . Saturation Ratios 12/15/2017 6* 10.4 - 31.8 % Final  . UIBC 12/15/2017 287  ug/dL Final   Performed at [facility], [address] [place], [place] [address]  . Ferritin 12/15/2017 62  11 - 307 ng/mL Final   Performed at [facility], [address] [place], [place] [address]  . Sed Rate 12/15/2017 41* 0 - 30 mm/hr Final   Performed at [facility], [address] [place], [place] [address]  . CALR Mutation Detection Result 12/15/2017 Comment   Final   Comment: (NOTE) NEGATIVE No insertions or deletions were detected within the analyzed region of the calreticulin (CALR) gene. A negative result does not entirely exclude the possibility of a clonal population carrying CALR gene mutations that are not covered by this assay. Results should be interpreted in conjunction with clinical and laboratory findings for the most accurate interpretation.   . Background: 12/15/2017 Comment   Final   Comment: (NOTE) The calcium-binding endoplasmic reticulin chaperone protein, calreticulin (CALR), is somatically mutated in approximately 70% of patients with JAK2-negative essential thrombocythemia (ET) and 60- 88% of patients with JAK2-negative primary myelofibrosis(PMF). Only a minority of patients (approximately 8%) with myelodysplasia have mutations in  CALR gene. CALR mutations are rarely detected in patients with de novo acute myeloid leukemia, chronic myelogenous leukemia, lymphoid leukemia, or solid tumors. CALR mutations are not detected in polycythemia and generally appear to be mutually exclusive with JAK2 mutations and MPL mutations. The majority of mutational changes involve a variety of insertion or deletion mutations in exon 9 of the calreticulin gene: approximately 53% of all CALR mutations are a 52 bp deletion (type-1) while the second most prevalent mutation (approximately 32%) contains a 5 bp insertion (type-2). Other mutations (non-type 1 or type 2) are seen                           in a small minority of cases. CALR mutations in PMF tend to be associated with a favorable prognosis compared to JAK2 V617F mutations, whereas primary myelofibrosis negative for CALR, JAK2 V617F and MPL mutations (so-called triple negative) is associated with a poor prognosis and shorter survival. The detection of a CALR gene mutation aids  in the specific diagnosis of a myeloproliferative neoplasm, and  help distinguish this clonal disease from a benign reactive process.   . Methodology: 12/15/2017 Comment   Final   Comment: (NOTE) Genomic DNA was isolated from the provided specimen. Polymerase chain reaction (PCR) of exon 9 of the CALR gene was performed with specific fluorescent-labeled primers, and the PCR product was analyzed by capillary gel electrophoresis to determine the size of the PCR products. This PCR assay is capable of detecting a mutant cell population with a sensitivity of 5 mutant cells per 100 normal cells. A negative result does not exclude the presence of a myeloproliferative disorder or other neoplastic process. This test was developed and its performance characteristics determined by [facility]. It has not been cleared or approved by the Food and Drug Administration. The FDA has determined that such clearance or approval is not necessary.   . References: 12/15/2017 Comment   Final   Comment: (NOTE) 1. Klampfel, T. et al. (2013) Somatic mutations of calreticulin in   myeloproliferative neoplasms. New Engl. J. Med. 161:0960-4540. 2. Haynes Kerns et al. (2013) Somatic CALR mutations in   myeloproliferative neoplasms with nonmutated JAK2. New Engl. J.   Med. [id number].   Marland Kitchen Director Review 12/15/2017 Comment   Final   Comment: (NOTE) Katina Degree, MD, PhD Director, [place] [facility] and Pathology [place], [place] [address] [phone]   . JAK2 Exons 12-15 Mut Det PCR: 12/15/2017 Comment   Final   Comment: (NOTE) NEGATIVE JAK2 mutations were not detected in exons 12, 13, 14 and 15. This result does not rule out the presence of JAK2 mutation at a level below the detection sensitivity of this assay, the presence of other mutations outside the analyzed region of the JAK2 gene, or the presence of a myeloproliferative or other neoplasm. Result must be  correlated with other clinical data for the most accurate diagnosis.   . Indications 12/15/2017 Comment:   Final   NO INDICATION SPECIFIED  . Specimen Type 12/15/2017 Comment   Final   No specimen type provided.  Marland Kitchen BACKGROUND: 12/15/2017 Comment   Final   Comment: (NOTE) JAK2 V617F mutation is detected in patients with polycythemia vera (95%), essential thrombocythemia (50%) and primary myelofibrosis (50%). A small percentage of JAK2 mutation positive patients (3.3%) contain other non-V617F mutations within exons 12 to 15. The detection of a JAK2 gene mutation aids in the specific diagnosis of a myeloproliferative neoplasm, and help distinguish this clonal disease from a benign reactive process.   . Method 12/15/2017 Comment   Final   Comment: (NOTE) Total RNA was purified from the provided specimen. The JAK2 gene region covering exons 12 to 15 was subjected to reverse- transcription coupled PCR amplification, and bi-directional sequencing to identify sequence variations. This assay has a sensitivity to detect approximately 15% population of cells containing the JAK2 mutations in a background of non-mutant cells. This test was developed and its performance characteristics determined by [facility]. It has not been cleared or approved by the Food and Drug Administration.   . References 12/15/2017 Comment   Final   Comment: (NOTE) Algasham, N. et al. Detection of mutations in JAK2 exons 12-15 by Sanger sequencing. Int J Lab Hemato. 2015, 38:34-41. Joelene Millin al. Mutation profile of JAK2 transcripts in patients with chronic myeloproliferative neoplasias. J Mol Diagn. 2009, 11:49-53.   Marland Kitchen DIRECTOR REVIEW: 12/15/2017 Comment   Final   Comment: (NOTE) Loni Muse, PhD  Director, [place] for [place],  [place] [address]  [phone]   . MPL MUTATION ANALYSIS RESULT: 12/15/2017 Comment   Final   Comment: (NOTE) No MPL  mutation was identified in the provided specimen of this individual. Results should be interpreted in conjunction with clinical and other laboratory findings for the most accurate interpretation.   Marland Kitchen BACKGROUND: 12/15/2017 Comment   Final   Comment: (NOTE) MPL (myeloproliferative leukemia virus oncogene homology) belongs to the hematopoietin superfamily and enables its ligand thrombopoietin to facilitate both global hematopoiesis and megakaryocyte growth and differentiation. MPL W515 mutations are present in patients with primary myelofibrosis (PMF) and essential thrombocythemia (ET) at a frequency of approximately 5% and 1% respectively. The S505 mutation is detected in patients with hereditary thrombocythemia.   Marland Kitchen METHODOLOGY: 12/15/2017 Comment   Final   Comment: (NOTE) Genomic DNA was purified from the provided specimen. MPL gene region covering the S505N and W515L/K mutations were subjected to PCR amplification and bi-directional sequencing in duplicate to identify sequence variations. This assay has a sensitivity to detect approximately 20-25% population of cells containing the MPL mutations in a background of non-mutant cells. This assay will not detect the mutation below the sensitivity of this assay. Molecular- based testing is highly accurate, but as in any laboratory test, rare diagnostic errors may occur.   Marland Kitchen REFERENCES: 12/15/2017 Comment   Final   Comment: (NOTE) 1. Pardanani AD, et al. (2006). MPL515 mutations in   myeloproliferative and other myeloid disorders: a study   of 1182 patients. Blood 130:8657-8469. 2. Andre Lefort and Levine RL. (2008). JAK2 and MPL   mutations in myeloproliferative neoplasms: discovery and   science. Leukemia 22:1813-1817. 3. Juline Patch, et al. (2009). Evidence for a founder effect   of the MPL-S505N mutation in eight [place] pedigrees with   hereditary thrombocythemia. Haematologica 94(10):1368-   6295.   Marland Kitchen DIRECTOR REVIEW:  12/15/2017 Comment   Final   Comment: (NOTE) Loni Muse, PhD  Director, [place] for [facility] and [place], [place] [address]  [phone] This test was developed and its performance characteristics determined by [facility]. It has not been cleared or approved by the Food and Drug Administration.   . Extraction 12/15/2017 Comment   Final   Comment: (NOTE) This sample has been received and DNA extraction has been performed. Performed At: [facility] [address] [place], [place] [id number] Nechama Guard MD [id number] Performed At: [facility] [address] [place], [place] [id number] Nechama Guard MD [id number] Performed at [facility], [address] [place], [place] [address]     Assessment:  Robin Kelly is a 52 y.o. female with  seronegative, CCP negative disease rheumatoid arthritis and likely reactive thrombocytosis.  Platelet count has ranged between 335,000 and 612,000 without trend since 01/2016.  Platelet count has ranged between 402,000 - 548,000 in the past year.  She has no evidence of iron deficiency.  She denies recurrent infections.  Work-up on 12/15/2017 revealed a hematocrit of 40.8, hemoglobin 14.0, platelets 547,000, WBC 13,800 with an ANC of 10,700.  Differential included 77% segs, 13% lymphs, and 9% monocytes.  CRP (5.1) and sed rate (41) were elevated. Ferritin was 62.  Iron studies revealed a saturation of 6% (low) and TIBC of 304.  JAK2 V617F, JAK2 exon 12-15, CALR, and MPL were negative.  Peripheral smear revealed a mixed RBC population, polychromasia, atypical lymphocytes, and giant platelets.   Abdominal ultrasound on 12/19/2017 revealed a normal spleen.  She  has never had a colonoscopy.  Last mammogram was > 2 years ago.  Symptomatically, she notes joint pain.  Exam reveals swollen right second PIP.  She has no appreciable hepatosplenomegaly.  Plan: 1.  Labs  today:  CBC with diff, ferritin, iron studies, sed rate, BCR-ABL. 2.  Peripheral smear for review.  Review work-up. Fluctuating platelet count suggests reactive thrombocytosis.  Work-up reassuring to date.  Discuss inflammation (RA) and likely some component of iron deficiency. Await additional studies. 2.  Iron studies appear low with low saturation and ferritin.  Ferritin falsely elevated secondary to inflammation.  Discuss iron supplementation. 3.  Suspect reactive lymphocytes.  No lymphocytosis on peripheral smear.  Follow-up. 4.  Follow-up JAK2 and reflex. Consider BCR-ABL testing if biologic DMARDs planned. 5.  Correlate ferritin, sed rate, and platelet count with future labs.  If platelet count out of proportion, would consider additional testing. 6.  Encourage colonoscopy and mammogram. 7.  RTC in 6 weeks for labs (CBC with diff, ferritin, sed rate, BCR-ABL). 8.  RTC in 3 months for MD assessment and labs (CBC with diff, ferritin, iron studies).   Nolon Stalls, MD 2018-05-23, 5:47 AM   I saw and evaluated the patient, participating in the key portions of the service and reviewing pertinent diagnostic studies and records.  I reviewed the nurse practitioner's note and agree with the findings and the plan.  The assessment and plan were discussed with the patient.  Additional diagnostic studies of *** are needed to clarify *** and would change the clinical management.  A few ***multiple questions were asked by the patient and answered.   Nolon Stalls, MD May 23, 2018,5:47 AM

## 2018-05-07 ENCOUNTER — Other Ambulatory Visit: Payer: Self-pay | Admitting: Internal Medicine

## 2018-05-11 ENCOUNTER — Encounter: Payer: Self-pay | Admitting: Emergency Medicine

## 2018-05-11 ENCOUNTER — Ambulatory Visit
Admission: EM | Admit: 2018-05-11 | Discharge: 2018-05-11 | Disposition: A | Discharge status: Home / Self Care | Payer: Commercial Managed Care - PPO | Attending: Family Medicine | Admitting: Family Medicine

## 2018-05-11 ENCOUNTER — Other Ambulatory Visit: Payer: Self-pay

## 2018-05-11 DIAGNOSIS — J0100 Acute maxillary sinusitis, unspecified: Secondary | ICD-10-CM

## 2018-05-11 HISTORY — DX: Rheumatoid arthritis, unspecified: M06.9

## 2018-05-11 MED ORDER — DOXYCYCLINE HYCLATE 100 MG PO CAPS: 100.0000 mg | ORAL_CAPSULE | Freq: Two times a day (BID) | ORAL | 0 refills | Status: DC

## 2018-05-11 NOTE — ED Provider Notes (Signed)
[facility]    CSN: [id number] Arrival date & time: 05/11/18  1142  History   Chief Complaint Chief Complaint  Patient presents with  . Nasal Congestion    APPT   HPI  52 year old female presents with respiratory symptoms.  Patient states that she is been sick for the past month.  She states that it started with a "head cold".  Has now progressed to sinus pain, pressure, and congestion.  Location: Maxillary region.  No fever or chills.  She states that she is blowing out purulent mucus from her nose.  No known exacerbating or relieving factors.  No other associated symptoms.  No other complaints.  Of note, patient is immunosuppressed from RA medications.  Past Medical History:  Diagnosis Date  . Allergy   . Anemia   . Anxiety    due to heavy bleeding and SOB  . Arthritis   . Heavy menstrual bleeding   . Rheumatoid arthritis ([place])   . Seasonal allergies   . Shortness of breath dyspnea    "due to anemia"    Patient Active Problem List   Diagnosis Date Noted  . Thrombocytosis ([place]) 10/13/2017  . Health care maintenance 04/20/2017  . Left knee pain 03/09/2017  . Pain of right heel 09/15/2016  . Anemia 03/14/2016  . Status post abdominal hysterectomy 03/12/2016  . Status post hysterectomy 03/12/2016  . Menorrhagia with irregular cycle 02/06/2016  . Endometrial polyp 02/06/2016  . Fibroids 02/06/2016    Past Surgical History:  Procedure Laterality Date  . ABDOMINAL HYSTERECTOMY    . APPENDECTOMY  2001   right tube and ovary removed also.  Marland Kitchen CESAREAN SECTION     x2  . CHOLECYSTECTOMY    . CYSTOSCOPY N/A 03/12/2016   Procedure: CYSTOSCOPY;  Surgeon: Will Bonnet, MD;  Location: [facility];  Service: Gynecology;  Laterality: N/A;  . DILATATION & CURETTAGE/HYSTEROSCOPY WITH MYOSURE N/A 02/06/2016   Procedure: DILATATION & CURETTAGE HYSTEROSCOPY  polypectomy;  Surgeon: Will Bonnet, MD;  Location: [facility];  Service: Gynecology;  Laterality: N/A;  .  POLYPECTOMY N/A 02/06/2016   Procedure: POLYPECTOMY;  Surgeon: Will Bonnet, MD;  Location: [facility];  Service: Gynecology;  Laterality: N/A;  . ROBOTIC ASSISTED TOTAL HYSTERECTOMY WITH SALPINGECTOMY Bilateral 03/12/2016   Converted to total abdominal hysterectom  . SALPINGOOPHORECTOMY Right    during appendectomy.    OB History   None      Home Medications    Prior to Admission medications   Medication Sig Start Date End Date Taking? Authorizing Provider  Adalimumab (HUMIRA PEN) 40 MG/0.8ML PNKT Inject 40 mg into the skin every 14 (fourteen) days.   Yes [provider]  aspirin 325 MG tablet Take 325 mg by mouth daily.   Yes [provider]  citalopram (CELEXA) 10 MG tablet TAKE 1 TABLET BY MOUTH EVERY DAY 02/10/18  Yes Einar Pheasant, MD  ferrous sulfate 325 (65 FE) MG tablet Take 325 mg by mouth every other day.   Yes [provider]  folic acid (FOLVITE) 1 MG tablet Take 1 mg by mouth daily.   Yes [provider]  methotrexate 50 MG/2ML injection Inject 1 mL as directed once a week. For 12 weeks 03/24/18  Yes [provider]  doxycycline (VIBRAMYCIN) 100 MG capsule Take 1 capsule (100 mg total) by mouth 2 (two) times daily. 05/11/18   Coral Spikes, DO    Family History Family History  Problem Relation Age of Onset  .  Hypertension Mother   . Hypertension Father   . Heart failure Father   . Cancer Father     Social History Social History   Tobacco Use  . Smoking status: Never Smoker  . Smokeless tobacco: Never Used  Substance Use Topics  . Alcohol use: No  . Drug use: No     Allergies   Penicillins and Oxycodone   Review of Systems Review of Systems  Constitutional: Negative.   HENT: Positive for congestion, sinus pressure and sinus pain.    Physical Exam Triage Vital Signs ED Triage Vitals  Enc Vitals Group     BP 05/11/18 1158 (!) 162/80     Pulse Rate 05/11/18 1158 97     Resp 05/11/18 1158 16     Temp  05/11/18 1158 98.3 F (36.8 C)     Temp Source 05/11/18 1158 Oral     SpO2 05/11/18 1158 99 %     Weight 05/11/18 1200 180 lb (81.6 kg)     Height 05/11/18 1200 5\' 2"  (1.575 m)     Head Circumference --      Peak Flow --      Pain Score 05/11/18 1159 0     Pain Loc --      Pain Edu? --      Excl. in [place]? --    Updated Vital Signs BP (!) 162/80 (BP Location: Left Arm)   Pulse 97   Temp 98.3 F (36.8 C) (Oral)   Resp 16   Ht 5\' 2"  (1.575 m)   Wt 180 lb (81.6 kg)   LMP 10/23/2015 Comment: bleeding since 10/23/15  SpO2 99%   BMI 32.92 kg/m   Physical Exam  Constitutional: She is oriented to person, place, and time. She appears well-developed. No distress.  HENT:  Normocephalic atraumatic.  Normal TMs bilaterally.  Maxillary sinus tenderness to palpation.  Cardiovascular: Normal rate and regular rhythm.  Pulmonary/Chest: Effort normal and breath sounds normal. She has no wheezes. She has no rales.  Neurological: She is alert and oriented to person, place, and time.  Psychiatric: She has a normal mood and affect. Her behavior is normal.  Nursing note and vitals reviewed.    UC Treatments / Results  Labs (all labs ordered are listed, but only abnormal results are displayed) Labs Reviewed - No data to display  EKG None  Radiology No results found.  Procedures Procedures (including critical care time)  Medications Ordered in UC Medications - No data to display  Initial Impression / Assessment and Plan / UC Course  I have reviewed the triage vital signs and the nursing notes.  Pertinent labs & imaging results that were available during my care of the patient were reviewed by me and considered in my medical decision making (see chart for details).    52 year old female presents with sinusitis.  Treating with doxycycline.  Final Clinical Impressions(s) / UC Diagnoses   Final diagnoses:  Acute maxillary sinusitis, recurrence not specified     Discharge  Instructions     Antibiotic as prescribed.  Resume Zyrtec.  Take care  Dr. Lacinda Axon    ED Prescriptions    Medication Sig Dispense Auth. Provider   doxycycline (VIBRAMYCIN) 100 MG capsule Take 1 capsule (100 mg total) by mouth 2 (two) times daily. 14 capsule Coral Spikes, DO     Controlled Substance Prescriptions [place] Controlled Substance Registry consulted? Not Applicable   Coral Spikes, DO 05/11/18 1230

## 2018-05-11 NOTE — ED Triage Notes (Signed)
Patient in today c/o sinus and allergy problems x 1 month. Patient states she has had a headache and left ear discomfort x 1 week. Patient is not able to take Zyrtec because of RA medications. Patient denies fever.

## 2018-05-11 NOTE — Discharge Instructions (Signed)
Antibiotic as prescribed.  Resume Zyrtec.  Take care  Dr. Lacinda Axon

## 2018-05-13 ENCOUNTER — Inpatient Hospital Stay: Payer: Commercial Managed Care - PPO

## 2018-05-13 ENCOUNTER — Other Ambulatory Visit: Payer: Self-pay | Admitting: Hematology and Oncology

## 2018-05-13 ENCOUNTER — Inpatient Hospital Stay: Payer: Commercial Managed Care - PPO | Admitting: Hematology and Oncology

## 2018-05-13 DIAGNOSIS — D75839 Thrombocytosis, unspecified: Secondary | ICD-10-CM

## 2018-05-13 DIAGNOSIS — D473 Essential (hemorrhagic) thrombocythemia: Secondary | ICD-10-CM

## 2018-05-13 NOTE — Progress Notes (Deleted)
[facility] day:  05/13/2018   Chief Complaint: Robin Kelly is a 52 y.o. female with rheumatoid arthritis and thrombocytosis who is seen for 5 month assessment.  HPI:  The patient was last seen in the hematology clinic on 12/24/2017.  At that time, she noted joint pain.  Exam revealed swollen right second PIP.  She had no appreciable hepatosplenomegaly.  Fluctuating platelet count suggested reactive thrombocytosis.  Work-up was reassuring.  We discussed inflammation (RA) and likely some component of iron deficiency.  Oral iron was encouraged.  During the interim,   Past Medical History:  Diagnosis Date  . Allergy   . Anemia   . Anxiety    due to heavy bleeding and SOB  . Arthritis   . Heavy menstrual bleeding   . Rheumatoid arthritis ([place])   . Seasonal allergies   . Shortness of breath dyspnea    "due to anemia"    Past Surgical History:  Procedure Laterality Date  . ABDOMINAL HYSTERECTOMY    . APPENDECTOMY  2001   right tube and ovary removed also.  Marland Kitchen CESAREAN SECTION     x2  . CHOLECYSTECTOMY    . CYSTOSCOPY N/A 03/12/2016   Procedure: CYSTOSCOPY;  Surgeon: Will Bonnet, MD;  Location: [facility] ORS;  Service: Gynecology;  Laterality: N/A;  . DILATATION & CURETTAGE/HYSTEROSCOPY WITH MYOSURE N/A 02/06/2016   Procedure: DILATATION & CURETTAGE HYSTEROSCOPY  polypectomy;  Surgeon: Will Bonnet, MD;  Location: [facility] OR[facility];  Service: Gynecology;  Laterality: N/A;  . POLYPECTOMY N/A 02/06/2016   Procedure: POLYPECTOMY;  Surgeon: Will Bonnet, MD;  Location: [facility] OR[facility];  Service: Gynecology;  Laterality: N/A;  . ROBOTIC ASSISTED TOTAL HYSTERECTOMY WITH SALPINGECTOMY Bilateral 03/12/2016   Converted to total abdominal hysterectom  . SALPINGOOPHORECTOMY Right    during appendectomy.    Family History  Problem Relation Age of Onset  . Hypertension Mother   . Hypertension Father   . Heart failure Father   . Cancer Father      Social History:  reports that she has never smoked. She has never used smokeless tobacco. She reports that she does not drink alcohol or use drugs.  She lives in [place].  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  The patient is accompanied by her daughter, Robin Kelly, today.  Allergies:  Allergies  Allergen Reactions  . Penicillins Swelling  . Oxycodone Palpitations    Current Medications: Current Outpatient Medications  Medication Sig Dispense Refill  . Adalimumab (HUMIRA PEN) 40 MG/0.8ML PNKT Inject 40 mg into the skin every 14 (fourteen) days.    Marland Kitchen aspirin 325 MG tablet Take 325 mg by mouth daily.    . citalopram (CELEXA) 10 MG tablet TAKE 1 TABLET BY MOUTH EVERY DAY 90 tablet 0  . doxycycline (VIBRAMYCIN) 100 MG capsule Take 1 capsule (100 mg total) by mouth 2 (two) times daily. 14 capsule 0  . ferrous sulfate 325 (65 FE) MG tablet Take 325 mg by mouth every other day.    . folic acid (FOLVITE) 1 MG tablet Take 1 mg by mouth daily.    . methotrexate 50 MG/2ML injection Inject 1 mL as directed once a week. For 12 weeks     No current facility-administered medications for this visit.     Review of Systems:  GENERAL:  Feels "ok".  No fevers or sweats.  Weight down 2 pounds since last visit. PERFORMANCE STATUS (  ECOG):  1 HEENT:  No visual changes, runny nose, sore throat, mouth sores or tenderness. Lungs: No shortness of breath.  Cough secondary to drainage.  No hemoptysis. Cardiac:  No chest pain, palpitations, orthopnea, or PND. Breasts:  No mammogram for 2 years. GI:  No nausea, vomiting, diarrhea, constipation, melena or hematochezia.  No prior colonoscopy. GU:  No urgency, frequency, dysuria, or hematuria. Musculoskeletal:  No back pain.  Joint pain in right index finger secondary to RA.  Right foot problems.  No muscle tenderness. Extremities:  No pain or swelling. Skin:  No rashes or skin changes. Neuro:  No headache, numbness or  weakness, balance or coordination issues. Endocrine:  No diabetes, thyroid issues, hot flashes or night sweats. Psych:  No mood changes, depression or anxiety. Pain:  Right index finger pain (8 out of 10). Review of systems:  All other systems reviewed and found to be negative.  Physical Exam: Last menstrual period 10/23/2015. GENERAL:  Well developed, well nourished, woman sitting comfortably in the exam room in no acute distress. MENTAL STATUS:  Alert and oriented to person, place and time. HEAD:  Long brown hair pulled back.  Normocephalic, atraumatic, face symmetric, no Cushingoid features. EYES:  Glasses. Blue eyes.  No conjunctivitis or scleral icterus. EXTREMITIES: Right second PIP edematous.  No lower extremity edema, no skin discoloration or tenderness.  NEUROLOGICAL: Unremarkable. PSYCH:  Appropriate.  GENERAL:  Well developed, well nourished, woman sitting comfortably in the exam room in no acute distress. MENTAL STATUS:  Alert and oriented to person, place and time. HEAD:  *** hair.  Normocephalic, atraumatic, face symmetric, no Cushingoid features. EYES:  *** eyes.  Pupils equal round and reactive to light and accomodation.  No conjunctivitis or scleral icterus. ENT:  Oropharynx clear without lesion.  Tongue normal. Mucous membranes moist.  RESPIRATORY:  Clear to auscultation without rales, wheezes or rhonchi. CARDIOVASCULAR:  Regular rate and rhythm without murmur, rub or gallop. BREAST:  Right breast without masses, skin changes or nipple discharge.  Left breast without masses, skin changes or nipple discharge. *** ABDOMEN:  Soft, non-tender, with active bowel sounds, and no hepatosplenomegaly.  No masses. SKIN:  No rashes, ulcers or lesions. EXTREMITIES: No edema, no skin discoloration or tenderness.  No palpable cords. LYMPH NODES: No palpable cervical, supraclavicular, axillary or inguinal adenopathy  NEUROLOGICAL: Unremarkable. PSYCH:  Appropriate.    No visits with  results within 3 Day(s) from this visit.  Latest known visit with results is:  Office Visit on 12/15/2017  Component Date Value Ref Range Status  . Uric Acid, Serum 12/15/2017 4.6  2.3 - 6.6 mg/dL Final   Performed at [facility], [address] [place], [place] [address]  . JAK2 GenotypR 12/15/2017 Comment   Final   Comment: (NOTE) Result: NEGATIVE for the JAK2 V617F mutation. Interpretation:  The G to T nucleotide change encoding the V617F mutation was not detected.  This result does not rule out the presence of the JAK2 mutation at a level below the sensitivity of detection of this assay, or the presence of other mutations within JAK2 not detected by this assay.  This result does not rule out a diagnosis of polycythemia vera, essential thrombocythemia or idiopathic myelofibrosis as the V617F mutation is not detected in all patients with these disorders.   Marland Kitchen BACKGROUND: 12/15/2017 Comment   Final   Comment: (NOTE) JAK2 is a cytoplasmic tyrosine kinase with a key role in signal transduction from multiple hematopoietic growth factor receptors. A  point mutation within exon 14 of the JAK2 gene ([id number]) encoding a valine to phenylalanine substitution at position 617 of the JAK2 protein (V617F) has been identified in most patients with polycythemia vera, and in about half of those with either essential thrombocythemia or idiopathic myelofibrosis. The V617F has also been detected, although infrequently, in other myeloid disorders such as chronic myelomonocytic leukemia and chronic neutrophilic luekemia. V617F is an acquired mutation that alters a highly conserved valine present in the negative regulatory JH2 domain of the JAK2 protein and is predicted to dysregulate kinase activity. Methodology: Total genomic DNA was extracted and subjected to TaqMan real-time PCR amplification/detection. Two amplification products per sample were monitored by real-time PCR using  primers/probes s                          pecific to JAK2 wild type (WT) and JAK2 mutant V617F. The ABI7900 Absolute Quantitation software will compare the patient specimen valuse to the standard curves and generate percent values for wild type and mutant type. In vitro studies have indicated that this assay has an analytical sensitivity of 1%. References: Baxter EJ, Scott Phineas Real, et al. Acquired mutation of the tyrosine kinase JAK2 in human myeloproliferative disorders. Lancet. 2005 Mar 19-25; 365(9464):1054-1061. Alfonso Ramus Couedic JP. A unique clonal JAK2 mutation leading to constitutive signaling causes polycythaemia vera. Nature. 2005 Apr 28; 434(7037):1144-1148. Kralovics R, Passamonti F, Buser AS, et al. A gain-of-function mutation of JAK2 in myeloproliferative disorders. N Engl J Med. 2005 Apr 28; 352(17):1779-1790.   . Director Review, JAK2 12/15/2017 Comment   Final   Comment: (NOTE) Constance Goltz, PhD, [facility]               Director, [place] [facility] [place], [place]               [phone] This test was developed and its performance characteristics determined by [facility]. It has not been cleared or approved by the Food and Drug Administration.   Marland Kitchen REFLEX: 12/15/2017 Comment   Final   Comment: (NOTE) Reflex to CALR Mutation Analysis, JAK2 Exon 12-15 Mutation Analysis, and MPL Mutation Analysis is indicated.   Marland Kitchen Extraction 12/15/2017 Completed   Corrected   Comment: (NOTE) Performed At: [facility] [address] [place], [place] [id number] Nechama Guard MD [id number] Performed At: [facility] [place], [place] [id number] Nechama Guard MD [id number] Performed at [facility], [address] [place], [place] [address]   . Tech Review 12/15/2017 POLYCHROMASIA PRESENT   Final   Comment: ATYPICAL  LYMPHOCYTES GIANT PLATELETS SEEN MIXED RBC POPULATION Performed at [facility], [address] [place], [place] [address]   . Rhuematoid fact SerPl-aCnc 12/15/2017 10.5  0.0 - 13.9 IU/mL Final   Comment: (NOTE) Performed At: [facility] [place], [place] [id number] Rush Farmer MD GY:[id number] Performed at [facility], [address], [place], [place] [address]   . WBC 12/15/2017 13.8* 3.6 - 11.0 K/uL Final  . RBC 12/15/2017 4.30  3.80 - 5.20 MIL/uL Final  . Hemoglobin 12/15/2017 14.0  12.0 - 16.0 g/dL Final  .  HCT 12/15/2017 40.8  35.0 - 47.0 % Final  . MCV 12/15/2017 94.8  80.0 - 100.0 fL Final  . MCH 12/15/2017 32.5  26.0 - 34.0 pg Final  . MCHC 12/15/2017 34.3  32.0 - 36.0 g/dL Final  . RDW 12/15/2017 15.1* 11.5 - 14.5 % Final  . Platelets 12/15/2017 547* 150 - 440 K/uL Final  . Neutrophils Relative % 12/15/2017 77  % Final  . Neutro Abs 12/15/2017 10.7* 1.4 - 6.5 K/uL Final  . Lymphocytes Relative 12/15/2017 13  % Final  . Lymphs Abs 12/15/2017 1.8  1.0 - 3.6 K/uL Final  . Monocytes Relative 12/15/2017 9  % Final  . Monocytes Absolute 12/15/2017 1.3* 0.2 - 0.9 K/uL Final  . Eosinophils Relative 12/15/2017 0  % Final  . Eosinophils Absolute 12/15/2017 0.0  0 - 0.7 K/uL Final  . Basophils Relative 12/15/2017 1  % Final  . Basophils Absolute 12/15/2017 0.1  0 - 0.1 K/uL Final   Performed at [facility], [address], [place], [place] [address]  . CRP 12/15/2017 5.1* <1.0 mg/dL Final   Performed at [facility] [address], [place], [place] [address]  . Iron 12/15/2017 17* 28 - 170 ug/dL Final  . TIBC 12/15/2017 304  250 - 450 ug/dL Final  . Saturation Ratios 12/15/2017 6* 10.4 - 31.8 % Final  . UIBC 12/15/2017 287  ug/dL Final   Performed at [facility], [address] [place], [place] [address]  . Ferritin 12/15/2017 62  11 - 307 ng/mL Final   Performed at [facility], [address] [place], [place] [address]  . Sed Rate 12/15/2017 41* 0 - 30 mm/hr Final   Performed at [facility], [address] [place], [place] [address]  . CALR Mutation Detection Result 12/15/2017 Comment   Final   Comment: (NOTE) NEGATIVE No insertions or deletions were detected within the analyzed region of the calreticulin (CALR) gene. A negative result does not entirely exclude the possibility of a clonal population carrying CALR gene mutations that are not covered by this assay. Results should be interpreted in conjunction with clinical and laboratory findings for the most accurate interpretation.   . Background: 12/15/2017 Comment   Final   Comment: (NOTE) The calcium-binding endoplasmic reticulin chaperone protein, calreticulin (CALR), is somatically mutated in approximately 70% of patients with JAK2-negative essential thrombocythemia (ET) and 60- 88% of patients with JAK2-negative primary myelofibrosis(PMF). Only a minority of patients (approximately 8%) with myelodysplasia have mutations in  CALR gene. CALR mutations are rarely detected in patients with de novo acute myeloid leukemia, chronic myelogenous leukemia, lymphoid leukemia, or solid tumors. CALR mutations are not detected in polycythemia and generally appear to be mutually exclusive with JAK2 mutations and MPL mutations. The majority of mutational changes involve a variety of insertion or deletion mutations in exon 9 of the calreticulin gene: approximately 53% of all CALR mutations are a 52 bp deletion (type-1) while the second most prevalent mutation (approximately 32%) contains a 5 bp insertion (type-2). Other mutations (non-type 1 or type 2) are seen                           in a small minority of cases. CALR mutations in PMF tend to be associated with a favorable prognosis compared to JAK2 V617F mutations, whereas primary myelofibrosis negative for CALR, JAK2 V617F and MPL mutations (so-called triple  negative) is associated with  a poor prognosis and shorter survival. The detection of a CALR gene mutation aids in the specific diagnosis of a myeloproliferative neoplasm, and help distinguish this clonal disease from a benign reactive process.   . Methodology: 12/15/2017 Comment   Final   Comment: (NOTE) Genomic DNA was isolated from the provided specimen. Polymerase chain reaction (PCR) of exon 9 of the CALR gene was performed with specific fluorescent-labeled primers, and the PCR product was analyzed by capillary gel electrophoresis to determine the size of the PCR products. This PCR assay is capable of detecting a mutant cell population with a sensitivity of 5 mutant cells per 100 normal cells. A negative result does not exclude the presence of a myeloproliferative disorder or other neoplastic process. This test was developed and its performance characteristics determined by [facility]. It has not been cleared or approved by the Food and Drug Administration. The FDA has determined that such clearance or approval is not necessary.   . References: 12/15/2017 Comment   Final   Comment: (NOTE) 1. Klampfel, T. et al. (2013) Somatic mutations of calreticulin in   myeloproliferative neoplasms. New Engl. J. Med. [phone]:[phone]. 2. Haynes Kerns et al. (2013) Somatic CALR mutations in   myeloproliferative neoplasms with nonmutated JAK2. New Engl. J.   Med. [phone].   Marland Kitchen Director Review 12/15/2017 Comment   Final   Comment: (NOTE) Katina Degree, MD, PhD Director, [place] [facility] [place], [place] [address] [phone]   . JAK2 Exons 12-15 Mut Det PCR: 12/15/2017 Comment   Final   Comment: (NOTE) NEGATIVE JAK2 mutations were not detected in exons 12, 13, 14 and 15. This result does not rule out the presence of JAK2 mutation at a level below the detection sensitivity of this assay, the presence of other mutations outside  the analyzed region of the JAK2 gene, or the presence of a myeloproliferative or other neoplasm. Result must be correlated with other clinical data for the most accurate diagnosis.   . Indications 12/15/2017 Comment:   Final   NO INDICATION SPECIFIED  . Specimen Type 12/15/2017 Comment   Final   No specimen type provided.  Marland Kitchen BACKGROUND: 12/15/2017 Comment   Final   Comment: (NOTE) JAK2 V617F mutation is detected in patients with polycythemia vera (95%), essential thrombocythemia (50%) and primary myelofibrosis (50%). A small percentage of JAK2 mutation positive patients (3.3%) contain other non-V617F mutations within exons 12 to 15. The detection of a JAK2 gene mutation aids in the specific diagnosis of a myeloproliferative neoplasm, and help distinguish this clonal disease from a benign reactive process.   . Method 12/15/2017 Comment   Final   Comment: (NOTE) Total RNA was purified from the provided specimen. The JAK2 gene region covering exons 12 to 15 was subjected to reverse- transcription coupled PCR amplification, and bi-directional sequencing to identify sequence variations. This assay has a sensitivity to detect approximately 15% population of cells containing the JAK2 mutations in a background of non-mutant cells. This test was developed and its performance characteristics determined by [facility]. It has not been cleared or approved by the Food and Drug Administration.   . References 12/15/2017 Comment   Final   Comment: (NOTE) Algasham, N. et al. Detection of mutations in JAK2 exons 12-15 by Sanger sequencing. Int J Lab Hemato. 2015, 38:34-41. Joelene Millin al. Mutation profile of JAK2 transcripts in patients with chronic myeloproliferative neoplasias. J Mol Diagn. 2009, 11:49-53.   Marland Kitchen DIRECTOR REVIEW: 12/15/2017 Comment   Final  Comment: (NOTE) Loni Muse, PhD  Director, [place] [facility]  [address] [place], [place]  [address]  [phone]   . MPL MUTATION ANALYSIS RESULT: 12/15/2017 Comment   Final   Comment: (NOTE) No MPL mutation was identified in the provided specimen of this individual. Results should be interpreted in conjunction with clinical and other laboratory findings for the most accurate interpretation.   Marland Kitchen BACKGROUND: 12/15/2017 Comment   Final   Comment: (NOTE) MPL (myeloproliferative leukemia virus oncogene homology) belongs to the hematopoietin superfamily and enables its ligand thrombopoietin to facilitate both global hematopoiesis and megakaryocyte growth and differentiation. MPL W515 mutations are present in patients with primary myelofibrosis (PMF) and essential thrombocythemia (ET) at a frequency of approximately 5% and 1% respectively. The S505 mutation is detected in patients with hereditary thrombocythemia.   Marland Kitchen METHODOLOGY: 12/15/2017 Comment   Final   Comment: (NOTE) Genomic DNA was purified from the provided specimen. MPL gene region covering the S505N and W515L/K mutations were subjected to PCR amplification and bi-directional sequencing in duplicate to identify sequence variations. This assay has a sensitivity to detect approximately 20-25% population of cells containing the MPL mutations in a background of non-mutant cells. This assay will not detect the mutation below the sensitivity of this assay. Molecular- based testing is highly accurate, but as in any laboratory test, rare diagnostic errors may occur.   Marland Kitchen REFERENCES: 12/15/2017 Comment   Final   Comment: (NOTE) 1. Pardanani AD, et al. (2006). MPL515 mutations in   myeloproliferative and other myeloid disorders: a study   of 1182 patients. Blood 299:3716-9678. 2. Andre Lefort and Levine RL. (2008). JAK2 and MPL   mutations in myeloproliferative neoplasms: discovery and   science. Leukemia 22:1813-1817. 3. Juline Patch, et al. (2009). Evidence for a founder effect   of the MPL-S505N mutation in eight  [place] pedigrees with   hereditary thrombocythemia. Haematologica 94(10):1368-   9381.   Marland Kitchen DIRECTOR REVIEW: 12/15/2017 Comment   Final   Comment: (NOTE) Loni Muse, PhD  Director, [place] [facility] [place], [place] [address]  [phone] This test was developed and its performance characteristics determined by [facility]. It has not been cleared or approved by the Food and Drug Administration.   . Extraction 12/15/2017 Comment   Final   Comment: (NOTE) This sample has been received and DNA extraction has been performed. Performed At: [facility] [address] [place], [place] [id number] Nechama Guard MD [id number] Performed At: [facility] [address] [place], [place] [id number] Nechama Guard MD [id number] Performed at [facility], [address] [place], [place] [address]     Assessment:  Robin Kelly is a 52 y.o. female with  seronegative, CCP negative disease rheumatoid arthritis and likely reactive thrombocytosis.  Platelet count has ranged between 335,000 and 612,000 without trend since 01/2016.  Platelet count has ranged between 402,000 - 548,000 in the past year.  She has no evidence of iron deficiency.  She denies recurrent infections.  Work-up on 12/15/2017 revealed a hematocrit of 40.8, hemoglobin 14.0, platelets 547,000, WBC 13,800 with an ANC of 10,700.  Differential included 77% segs, 13% lymphs, and 9% monocytes.  CRP (5.1) and sed rate (41) were elevated. Ferritin was 62.  Iron studies revealed a saturation of 6% (low) and TIBC of 304.  JAK2 V617F, JAK2 exon 12-15, CALR, and MPL were negative.  Peripheral smear revealed a  mixed RBC population, polychromasia, atypical lymphocytes, and giant platelets.   Abdominal ultrasound on 12/19/2017 revealed a normal spleen.  She has never had a colonoscopy.  Last mammogram was > 2 years ago.  Symptomatically, she notes joint  pain.  Exam reveals swollen right second PIP.  She has no appreciable hepatosplenomegaly.  Plan: 1.  Labs today:  CBC with diff, ferritin, iron studies, sed rate, BCR-ABL. 2.  Peripheral smear for review.   Review work-up. Fluctuating platelet count suggests reactive thrombocytosis.  Work-up reassuring to date.  Discuss inflammation (RA) and likely some component of iron deficiency. Await additional studies. 2.  Iron studies appear low with low saturation and ferritin.  Ferritin falsely elevated secondary to inflammation.  Discuss iron supplementation. 3.  Suspect reactive lymphocytes.  No lymphocytosis on peripheral smear.  Follow-up. 4.  Follow-up JAK2 and reflex. Consider BCR-ABL testing if biologic DMARDs planned. 5.  Correlate ferritin, sed rate, and platelet count with future labs.  If platelet count out of proportion, would consider additional testing. 6.  Encourage colonoscopy and mammogram. 7.  RTC in 6 weeks for labs (CBC with diff, ferritin, sed rate, BCR-ABL). 8.  RTC in 3 months for MD assessment and labs (CBC with diff, ferritin, iron studies).   Nolon Stalls, MD 2018/05/27, 6:07 AM   I saw and evaluated the patient, participating in the key portions of the service and reviewing pertinent diagnostic studies and records.  I reviewed the nurse practitioner's note and agree with the findings and the plan.  The assessment and plan were discussed with the patient.  Additional diagnostic studies of *** are needed to clarify *** and would change the clinical management.  A few ***multiple questions were asked by the patient and answered.   Nolon Stalls, MD May 27, 2018,6:07 AM

## 2018-06-03 ENCOUNTER — Ambulatory Visit: Payer: Commercial Managed Care - PPO | Admitting: Internal Medicine

## 2018-06-16 ENCOUNTER — Other Ambulatory Visit: Payer: Self-pay | Admitting: Internal Medicine

## 2018-11-10 ENCOUNTER — Telehealth: Payer: Self-pay

## 2018-11-10 NOTE — Telephone Encounter (Signed)
Copied from [place] [id number]. Topic: Appointment Scheduling - Scheduling Inquiry for Clinic >> Nov 10, 2018 12:27 PM Robin Kelly, NT wrote: Reason for CRM: Patient called and needs a follow up on her medication check for anxiety with Dr Nicki Reaper please call her [phone]

## 2018-11-12 NOTE — Telephone Encounter (Signed)
Pt scheduled for medication follow up and advised that she must keep appt for further refills

## 2018-12-03 DIAGNOSIS — Z79899 Other long term (current) drug therapy: Secondary | ICD-10-CM | POA: Diagnosis not present

## 2018-12-03 DIAGNOSIS — M1380 Other specified arthritis, unspecified site: Secondary | ICD-10-CM | POA: Diagnosis not present

## 2018-12-03 DIAGNOSIS — M1990 Unspecified osteoarthritis, unspecified site: Secondary | ICD-10-CM | POA: Diagnosis not present

## 2018-12-07 ENCOUNTER — Other Ambulatory Visit: Payer: Self-pay | Admitting: Internal Medicine

## 2018-12-11 ENCOUNTER — Encounter: Payer: Self-pay | Admitting: Internal Medicine

## 2018-12-11 ENCOUNTER — Ambulatory Visit: Payer: Commercial Managed Care - PPO | Admitting: Internal Medicine

## 2018-12-11 VITALS — BP 148/90 | HR 86 | Temp 97.9°F | Resp 18 | Wt 204.2 lb

## 2018-12-11 DIAGNOSIS — Z13220 Encounter for screening for lipoid disorders: Secondary | ICD-10-CM | POA: Diagnosis not present

## 2018-12-11 DIAGNOSIS — D473 Essential (hemorrhagic) thrombocythemia: Secondary | ICD-10-CM | POA: Diagnosis not present

## 2018-12-11 DIAGNOSIS — M1380 Other specified arthritis, unspecified site: Secondary | ICD-10-CM

## 2018-12-11 DIAGNOSIS — D72829 Elevated white blood cell count, unspecified: Secondary | ICD-10-CM | POA: Diagnosis not present

## 2018-12-11 DIAGNOSIS — M1990 Unspecified osteoarthritis, unspecified site: Secondary | ICD-10-CM

## 2018-12-11 DIAGNOSIS — R030 Elevated blood-pressure reading, without diagnosis of hypertension: Secondary | ICD-10-CM

## 2018-12-11 DIAGNOSIS — D75839 Thrombocytosis, unspecified: Secondary | ICD-10-CM

## 2018-12-11 NOTE — Progress Notes (Signed)
Patient ID: Robin Kelly, female   DOB: 11/20/1966, 53 y.o.   MRN: [id number]   Subjective:    Patient ID: Robin Kelly, female    DOB: [birth year], 53 y.o.   MRN: [id number]  HPI  Patient here for a scheduled follow up.  She reports she is feeling better.  Diagnosed with seronegative arthritis.  Last seen 12/03/18.  On humira and MTX.  Recommended f/u in 5 months.  Joints better.  Feels better.  Discussed since feeling better, more exercise.  No chest pain.  No sob.  No acid reflux.  No abdominal pain.  Bowels moving.  Feels she is doing well on citalopram.     Past Medical History:  Diagnosis Date  . Allergy   . Anemia   . Anxiety    due to heavy bleeding and SOB  . Arthritis   . Heavy menstrual bleeding   . Rheumatoid arthritis ([place])   . Seasonal allergies   . Shortness of breath dyspnea    "due to anemia"   Past Surgical History:  Procedure Laterality Date  . ABDOMINAL HYSTERECTOMY    . APPENDECTOMY  2001   right tube and ovary removed also.  Marland Kitchen CESAREAN SECTION     x2  . CHOLECYSTECTOMY    . CYSTOSCOPY N/A 03/12/2016   Procedure: CYSTOSCOPY;  Surgeon: Will Bonnet, MD;  Location: [facility];  Service: Gynecology;  Laterality: N/A;  . DILATATION & CURETTAGE/HYSTEROSCOPY WITH MYOSURE N/A 02/06/2016   Procedure: DILATATION & CURETTAGE HYSTEROSCOPY  polypectomy;  Surgeon: Will Bonnet, MD;  Location: [facility];  Service: Gynecology;  Laterality: N/A;  . POLYPECTOMY N/A 02/06/2016   Procedure: POLYPECTOMY;  Surgeon: Will Bonnet, MD;  Location: [facility];  Service: Gynecology;  Laterality: N/A;  . ROBOTIC ASSISTED TOTAL HYSTERECTOMY WITH SALPINGECTOMY Bilateral 03/12/2016   Converted to total abdominal hysterectom  . SALPINGOOPHORECTOMY Right    during appendectomy.   Family History  Problem Relation Age of Onset  . Hypertension Mother   . Hypertension Father   . Heart failure Father   . Cancer Father    Social History   Socioeconomic History  . Marital  status: Married    Spouse name: Not on file  . Number of children: Not on file  . Years of education: Not on file  . Highest education level: Not on file  Occupational History  . Not on file  Social Needs  . Financial resource strain: Not on file  . Food insecurity:    Worry: Not on file    Inability: Not on file  . Transportation needs:    Medical: Not on file    Non-medical: Not on file  Tobacco Use  . Smoking status: Never Smoker  . Smokeless tobacco: Never Used  Substance and Sexual Activity  . Alcohol use: No  . Drug use: No  . Sexual activity: Not on file  Lifestyle  . Physical activity:    Days per week: Not on file    Minutes per session: Not on file  . Stress: Not on file  Relationships  . Social connections:    Talks on phone: Not on file    Gets together: Not on file    Attends religious service: Not on file    Active member of club or organization: Not on file    Attends meetings of clubs or organizations: Not on file    Relationship status: Not on file  Other Topics Concern  . Not  on file  Social History Narrative  . Not on file    Outpatient Encounter Medications as of 12/11/2018  Medication Sig  . Adalimumab (HUMIRA PEN) 40 MG/0.8ML PNKT Inject 40 mg into the skin every 14 (fourteen) days.  Marland Kitchen aspirin 325 MG tablet Take 325 mg by mouth daily.  . citalopram (CELEXA) 10 MG tablet TAKE 1 TABLET BY MOUTH EVERY DAY  . ferrous sulfate 325 (65 FE) MG tablet Take 325 mg by mouth every other day.  . folic acid (FOLVITE) 1 MG tablet Take 1 mg by mouth daily.  . methotrexate 50 MG/2ML injection Inject 1 mL as directed once a week. For 12 weeks  . [DISCONTINUED] doxycycline (VIBRAMYCIN) 100 MG capsule Take 1 capsule (100 mg total) by mouth 2 (two) times daily.   No facility-administered encounter medications on file as of 12/11/2018.     Review of Systems  Constitutional: Negative for appetite change and unexpected weight change.  HENT: Negative for congestion  and sinus pressure.   Respiratory: Negative for cough, chest tightness and shortness of breath.   Cardiovascular: Negative for chest pain, palpitations and leg swelling.  Gastrointestinal: Negative for abdominal pain, diarrhea, nausea and vomiting.  Genitourinary: Negative for difficulty urinating and dysuria.  Musculoskeletal: Negative for joint swelling and myalgias.  Skin: Negative for color change and rash.  Neurological: Negative for dizziness, light-headedness and headaches.  Psychiatric/Behavioral: Negative for agitation and dysphoric mood.       Objective:    Physical Exam Constitutional:      General: She is not in acute distress.    Appearance: Normal appearance.  HENT:     Nose: Nose normal. No congestion.     Mouth/Throat:     Pharynx: No oropharyngeal exudate or posterior oropharyngeal erythema.  Neck:     Musculoskeletal: Neck supple. No muscular tenderness.     Thyroid: No thyromegaly.  Cardiovascular:     Rate and Rhythm: Normal rate and regular rhythm.  Pulmonary:     Effort: No respiratory distress.     Breath sounds: Normal breath sounds. No wheezing.  Abdominal:     General: Bowel sounds are normal.     Palpations: Abdomen is soft.     Tenderness: There is no abdominal tenderness.  Musculoskeletal:        General: No swelling or tenderness.  Lymphadenopathy:     Cervical: No cervical adenopathy.  Skin:    Findings: No erythema or rash.  Neurological:     Mental Status: She is alert.  Psychiatric:        Mood and Affect: Mood normal.        Behavior: Behavior normal.     BP (!) 148/90 (BP Location: Left Arm, Patient Position: Sitting, Cuff Size: Normal)   Pulse 86   Temp 97.9 F (36.6 C) (Oral)   Resp 18   Wt 204 lb 3.2 oz (92.6 kg)   LMP 10/23/2015 Comment: bleeding since 10/23/15  SpO2 97%   BMI 37.35 kg/m  Wt Readings from Last 3 Encounters:  12/11/18 204 lb 3.2 oz (92.6 kg)  05/11/18 180 lb (81.6 kg)  12/24/17 203 lb 7.8 oz (92.3 kg)      Lab Results  Component Value Date   WBC 13.8 (H) 12/15/2017   HGB 14.0 12/15/2017   HCT 40.8 12/15/2017   PLT 547 (H) 12/15/2017   GLUCOSE 108 (H) 03/14/2016   ALT 25 03/04/2016   AST 24 03/04/2016   NA 139 03/14/2016   K  3.9 03/14/2016   CL 108 03/14/2016   CREATININE 0.66 03/14/2016   BUN 7 03/14/2016   CO2 28 03/14/2016       Assessment & Plan:   Problem List Items Addressed This Visit    Elevated blood pressure reading    Blood pressure elevated.  Recheck improved.  Follow pressures.  Send in readings.        Inflammatory arthritis    Followed by rheumatology.        Leukocytosis    Recheck cbc.        Relevant Orders   CBC with Differential/Platelet   Seronegative arthritis    Continue f/u with rheumatology.  On MTX and humira.       Thrombocytosis (HCC)    Increased platelet count next visit.  Recheck cbc.        Relevant Orders   Hepatic function panel   TSH   Basic metabolic panel    Other Visit Diagnoses    Screening cholesterol level    -  Primary   Relevant Orders   Lipid panel       Einar Pheasant, MD

## 2018-12-13 ENCOUNTER — Encounter: Payer: Self-pay | Admitting: Internal Medicine

## 2018-12-13 DIAGNOSIS — R030 Elevated blood-pressure reading, without diagnosis of hypertension: Secondary | ICD-10-CM | POA: Insufficient documentation

## 2018-12-13 DIAGNOSIS — M069 Rheumatoid arthritis, unspecified: Secondary | ICD-10-CM | POA: Insufficient documentation

## 2018-12-13 DIAGNOSIS — M1990 Unspecified osteoarthritis, unspecified site: Secondary | ICD-10-CM | POA: Insufficient documentation

## 2018-12-13 DIAGNOSIS — D72829 Elevated white blood cell count, unspecified: Secondary | ICD-10-CM | POA: Insufficient documentation

## 2018-12-13 DIAGNOSIS — M1380 Other specified arthritis, unspecified site: Secondary | ICD-10-CM | POA: Insufficient documentation

## 2018-12-13 NOTE — Assessment & Plan Note (Signed)
Followed by rheumatology. 

## 2018-12-13 NOTE — Assessment & Plan Note (Signed)
Recheck cbc.  

## 2018-12-13 NOTE — Assessment & Plan Note (Signed)
Increased platelet count next visit.  Recheck cbc.

## 2018-12-13 NOTE — Assessment & Plan Note (Signed)
Continue f/u with rheumatology.  On MTX and humira.

## 2018-12-13 NOTE — Assessment & Plan Note (Signed)
Blood pressure elevated.  Recheck improved.  Follow pressures.  Send in readings.

## 2018-12-31 ENCOUNTER — Ambulatory Visit: Payer: Commercial Managed Care - PPO | Admitting: Family Medicine

## 2018-12-31 ENCOUNTER — Encounter: Payer: Self-pay | Admitting: Internal Medicine

## 2018-12-31 ENCOUNTER — Ambulatory Visit: Payer: Commercial Managed Care - PPO | Admitting: Internal Medicine

## 2018-12-31 VITALS — BP 136/78 | HR 96 | Temp 98.5°F | Resp 16 | Wt 200.4 lb

## 2018-12-31 DIAGNOSIS — J329 Chronic sinusitis, unspecified: Secondary | ICD-10-CM | POA: Diagnosis not present

## 2018-12-31 MED ORDER — DOXYCYCLINE HYCLATE 100 MG PO TABS: 100.0000 mg | ORAL_TABLET | Freq: Two times a day (BID) | ORAL | 0 refills | Status: DC

## 2018-12-31 NOTE — Progress Notes (Signed)
Patient ID: Robin Kelly, female   DOB: 12/19/1965, 53 y.o.   MRN: [id number]   Subjective:    Patient ID: Robin Kelly, female    DOB: 1966-09-21, 53 y.o.   MRN: [id number]  HPI  Patient here as a work in with concerns regarding a possible sinus infection.  States had a bad cold two weeks ago.  Approximately 10 days ago, developed increased sinus pressure.  States hurts into her teeth.  Thick mucus production. Blood tinged - nasal congestion.  Minimal sore throat.  No chest congestion or cough.  No documented fever.  Eating.  No vomiting or diarrhea.     Past Medical History:  Diagnosis Date  . Allergy   . Anemia   . Anxiety    due to heavy bleeding and SOB  . Arthritis   . Heavy menstrual bleeding   . Rheumatoid arthritis ([place])   . Seasonal allergies   . Shortness of breath dyspnea    "due to anemia"   Past Surgical History:  Procedure Laterality Date  . ABDOMINAL HYSTERECTOMY    . APPENDECTOMY  2001   right tube and ovary removed also.  Marland Kitchen CESAREAN SECTION     x2  . CHOLECYSTECTOMY    . CYSTOSCOPY N/A 03/12/2016   Procedure: CYSTOSCOPY;  Surgeon: Will Bonnet, MD;  Location: [facility];  Service: Gynecology;  Laterality: N/A;  . DILATATION & CURETTAGE/HYSTEROSCOPY WITH MYOSURE N/A 02/06/2016   Procedure: DILATATION & CURETTAGE HYSTEROSCOPY  polypectomy;  Surgeon: Will Bonnet, MD;  Location: [facility];  Service: Gynecology;  Laterality: N/A;  . POLYPECTOMY N/A 02/06/2016   Procedure: POLYPECTOMY;  Surgeon: Will Bonnet, MD;  Location: [facility];  Service: Gynecology;  Laterality: N/A;  . ROBOTIC ASSISTED TOTAL HYSTERECTOMY WITH SALPINGECTOMY Bilateral 03/12/2016   Converted to total abdominal hysterectom  . SALPINGOOPHORECTOMY Right    during appendectomy.   Family History  Problem Relation Age of Onset  . Hypertension Mother   . Hypertension Father   . Heart failure Father   . Cancer Father    Social History   Socioeconomic History  . Marital status:  Married    Spouse name: Not on file  . Number of children: Not on file  . Years of education: Not on file  . Highest education level: Not on file  Occupational History  . Not on file  Social Needs  . Financial resource strain: Not on file  . Food insecurity:    Worry: Not on file    Inability: Not on file  . Transportation needs:    Medical: Not on file    Non-medical: Not on file  Tobacco Use  . Smoking status: Never Smoker  . Smokeless tobacco: Never Used  Substance and Sexual Activity  . Alcohol use: No  . Drug use: No  . Sexual activity: Not on file  Lifestyle  . Physical activity:    Days per week: Not on file    Minutes per session: Not on file  . Stress: Not on file  Relationships  . Social connections:    Talks on phone: Not on file    Gets together: Not on file    Attends religious service: Not on file    Active member of club or organization: Not on file    Attends meetings of clubs or organizations: Not on file    Relationship status: Not on file  Other Topics Concern  . Not on file  Social History Narrative  .  Not on file    Outpatient Encounter Medications as of 12/31/2018  Medication Sig  . Adalimumab (HUMIRA PEN) 40 MG/0.8ML PNKT Inject 40 mg into the skin every 14 (fourteen) days.  Marland Kitchen aspirin 325 MG tablet Take 325 mg by mouth daily.  . citalopram (CELEXA) 10 MG tablet TAKE 1 TABLET BY MOUTH EVERY DAY  . doxycycline (VIBRA-TABS) 100 MG tablet Take 1 tablet (100 mg total) by mouth 2 (two) times daily.  . ferrous sulfate 325 (65 FE) MG tablet Take 325 mg by mouth every other day.  . folic acid (FOLVITE) 1 MG tablet Take 1 mg by mouth daily.  . methotrexate 50 MG/2ML injection Inject 1 mL as directed once a week. For 12 weeks   No facility-administered encounter medications on file as of 12/31/2018.     Review of Systems  Constitutional: Negative for appetite change and fever.  HENT: Positive for congestion, postnasal drip and sinus pressure.     Respiratory: Negative for cough and shortness of breath.   Cardiovascular: Negative for chest pain.  Gastrointestinal: Negative for diarrhea, nausea and vomiting.  Musculoskeletal: Negative for joint swelling and myalgias.  Skin: Negative for color change and rash.  Neurological: Negative for dizziness and headaches.  Psychiatric/Behavioral: Negative for agitation and dysphoric mood.       Objective:    Physical Exam Constitutional:      General: She is not in acute distress.    Appearance: Normal appearance.  HENT:     Nose: Congestion present.     Comments: Slightly erythematous turbinates.      Mouth/Throat:     Pharynx: No oropharyngeal exudate or posterior oropharyngeal erythema.  Neck:     Musculoskeletal: Neck supple. No muscular tenderness.     Thyroid: No thyromegaly.  Cardiovascular:     Rate and Rhythm: Normal rate and regular rhythm.  Pulmonary:     Effort: No respiratory distress.     Breath sounds: Normal breath sounds. No wheezing.  Lymphadenopathy:     Cervical: No cervical adenopathy.  Skin:    Findings: No rash.  Neurological:     Mental Status: She is alert.  Psychiatric:        Mood and Affect: Mood normal.        Behavior: Behavior normal.     BP 136/78 (BP Location: Left Arm, Patient Position: Sitting, Cuff Size: Normal)   Pulse 96   Temp 98.5 F (36.9 C) (Oral)   Resp 16   Wt 200 lb 6.4 oz (90.9 kg)   LMP 10/23/2015 Comment: bleeding since 10/23/15  SpO2 98%   BMI 36.65 kg/m  Wt Readings from Last 3 Encounters:  12/31/18 200 lb 6.4 oz (90.9 kg)  12/11/18 204 lb 3.2 oz (92.6 kg)  05/11/18 180 lb (81.6 kg)     Lab Results  Component Value Date   WBC 13.8 (H) 12/15/2017   HGB 14.0 12/15/2017   HCT 40.8 12/15/2017   PLT 547 (H) 12/15/2017   GLUCOSE 108 (H) 03/14/2016   ALT 25 03/04/2016   AST 24 03/04/2016   NA 139 03/14/2016   K 3.9 03/14/2016   CL 108 03/14/2016   CREATININE 0.66 03/14/2016   BUN 7 03/14/2016   CO2 28  03/14/2016       Assessment & Plan:   Problem List Items Addressed This Visit    None    Visit Diagnoses    Sinusitis, unspecified chronicity, unspecified location    -  Primary  Symptoms and exam c/w sinus infection.  Treat with doxycycline, flonase nasal spray and mucinex/robitussin as directed.  Follow.     Relevant Medications   doxycycline (VIBRA-TABS) 100 MG tablet       Einar Pheasant, MD

## 2018-12-31 NOTE — Patient Instructions (Signed)
flonase nasal spray - 2 sprays each nostril one time per day.  Do this in the evening.    Take a probiotic daily while you are on the antibiotic and for two weeks after completing the antibiotic.    mucinex in the am and robitussin in the evening if needed for congestion.

## 2019-01-03 ENCOUNTER — Encounter: Payer: Self-pay | Admitting: Internal Medicine

## 2019-02-22 ENCOUNTER — Ambulatory Visit: Payer: Commercial Managed Care - PPO | Admitting: Internal Medicine

## 2019-02-23 ENCOUNTER — Ambulatory Visit: Payer: Commercial Managed Care - PPO | Admitting: Internal Medicine

## 2019-02-26 ENCOUNTER — Encounter: Payer: Self-pay | Admitting: *Deleted

## 2019-03-17 ENCOUNTER — Telehealth: Payer: Self-pay | Admitting: *Deleted

## 2019-03-17 NOTE — Telephone Encounter (Signed)
I spoke with patient today to get her back on Dr. Nicki Reaper schedule for a virtual visit. Pt states that she will call back in an hour to schedule when she gets home & can grab her calendar. Anyone can schedule that appt for her. Thanks

## 2019-04-02 ENCOUNTER — Ambulatory Visit (INDEPENDENT_AMBULATORY_CARE_PROVIDER_SITE_OTHER): Payer: Commercial Managed Care - PPO | Admitting: Internal Medicine

## 2019-04-02 ENCOUNTER — Other Ambulatory Visit: Payer: Self-pay

## 2019-04-02 ENCOUNTER — Encounter: Payer: Self-pay | Admitting: Internal Medicine

## 2019-04-02 DIAGNOSIS — R030 Elevated blood-pressure reading, without diagnosis of hypertension: Secondary | ICD-10-CM

## 2019-04-02 DIAGNOSIS — D75839 Thrombocytosis, unspecified: Secondary | ICD-10-CM

## 2019-04-02 DIAGNOSIS — M1990 Unspecified osteoarthritis, unspecified site: Secondary | ICD-10-CM

## 2019-04-02 DIAGNOSIS — D473 Essential (hemorrhagic) thrombocythemia: Secondary | ICD-10-CM

## 2019-04-02 DIAGNOSIS — Z1239 Encounter for other screening for malignant neoplasm of breast: Secondary | ICD-10-CM

## 2019-04-02 DIAGNOSIS — D649 Anemia, unspecified: Secondary | ICD-10-CM

## 2019-04-02 DIAGNOSIS — F439 Reaction to severe stress, unspecified: Secondary | ICD-10-CM

## 2019-04-02 NOTE — Progress Notes (Signed)
Patient ID: Robin Kelly, female   DOB: 12/04/1965, 53 y.o.   MRN: [id number]   Virtual Visit via video Note  This visit type was conducted due to national recommendations for restrictions regarding the COVID-19 pandemic (e.g. social distancing).  This format is felt to be most appropriate for this patient at this time.  All issues noted in this document were discussed and addressed.  No physical exam was performed (except for noted visual exam findings with Video Visits).   I connected with Marta Antu by a video enabled telemedicine application or telephone and verified that I am speaking with the correct person using two identifiers. Location patient: home Location provider: work Persons participating in the virtual visit: patient, provider  I discussed the limitations, risks, security and privacy concerns of performing an evaluation and management service by video and the availability of in person appointments.The patient expressed understanding and agreed to proceed.   Reason for visit: scheduled follow up.   HPI: She reports she is doing relatively well.  Increased stress with family health issues, but overall she feels she is handling things relatively well.  On citalopram.  Now taking two per day.  States she wants to stay on this dose for now.  States her blood pressure has ben averaging 128-130/85-90.  Only a few readings. Discussed the need to monitor her blood pressure and send in readings.  She has tried to adjust her diet.  Has cut out bread.  Discussed low carb diet and exercise.  No chest pain.  No sob. no acid reflux.  No abdominal pain or cramping.  Bowels stable.  Joints doing well.  On humira/MTX.  Followed by rheumatology.  Discussed the need for f/u with gyn.  She will call to schedule.  Overdue mammogram.      ROS: See pertinent positives and negatives per HPI.  Past Medical History:  Diagnosis Date  . Allergy   . Anemia   . Anxiety    due to heavy bleeding and SOB   . Arthritis   . Heavy menstrual bleeding   . Rheumatoid arthritis ([place])   . Seasonal allergies   . Shortness of breath dyspnea    "due to anemia"    Past Surgical History:  Procedure Laterality Date  . ABDOMINAL HYSTERECTOMY    . APPENDECTOMY  2001   right tube and ovary removed also.  Marland Kitchen CESAREAN SECTION     x2  . CHOLECYSTECTOMY    . CYSTOSCOPY N/A 03/12/2016   Procedure: CYSTOSCOPY;  Surgeon: Will Bonnet, MD;  Location: [facility];  Service: Gynecology;  Laterality: N/A;  . DILATATION & CURETTAGE/HYSTEROSCOPY WITH MYOSURE N/A 02/06/2016   Procedure: DILATATION & CURETTAGE HYSTEROSCOPY  polypectomy;  Surgeon: Will Bonnet, MD;  Location: [facility];  Service: Gynecology;  Laterality: N/A;  . POLYPECTOMY N/A 02/06/2016   Procedure: POLYPECTOMY;  Surgeon: Will Bonnet, MD;  Location: [facility];  Service: Gynecology;  Laterality: N/A;  . ROBOTIC ASSISTED TOTAL HYSTERECTOMY WITH SALPINGECTOMY Bilateral 03/12/2016   Converted to total abdominal hysterectom  . SALPINGOOPHORECTOMY Right    during appendectomy.    Family History  Problem Relation Age of Onset  . Hypertension Mother   . Hypertension Father   . Heart failure Father   . Cancer Father     SOCIAL HX: reviewed.    Current Outpatient Medications:  .  Adalimumab (HUMIRA PEN) 40 MG/0.8ML PNKT, Inject 40 mg into the skin every 14 (fourteen) days., Disp: , Rfl:  .  aspirin 325 MG tablet, Take 325 mg by mouth daily., Disp: , Rfl:  .  citalopram (CELEXA) 10 MG tablet, Take 1-2 tablets q day, Disp: 180 tablet, Rfl: 0 .  ferrous sulfate 325 (65 FE) MG tablet, Take 325 mg by mouth every other day., Disp: , Rfl:  .  folic acid (FOLVITE) 1 MG tablet, Take 1 mg by mouth daily., Disp: , Rfl:  .  methotrexate 50 MG/2ML injection, Inject 1 mL as directed once a week. For 12 weeks, Disp: , Rfl:   EXAM:  VITALS per patient if applicable: 100/71, 219/75.    GENERAL: alert, oriented, appears well and in no acute distress   HEENT: atraumatic, conjunttiva clear, no obvious abnormalities on inspection of external nose and ears  NECK: normal movements of the head and neck  LUNGS: on inspection no signs of respiratory distress, breathing rate appears normal, no obvious gross SOB, gasping or wheezing  CV: no obvious cyanosis  PSYCH/NEURO: pleasant and cooperative, no obvious depression or anxiety, speech and thought processing grossly intact  ASSESSMENT AND PLAN:  Discussed the following assessment and plan:  Breast cancer screening - Overdue mammogram.  schedule.   - Plan: MM 3D SCREEN BREAST BILATERAL  Anemia, unspecified type  Elevated blood pressure reading  Inflammatory arthritis  Thrombocytosis (HCC)  Stress  Anemia Follow cbc.   Elevated blood pressure reading Blood pressure as outlined.  Have her spot check her pressure.  Call in readings. If persistent elevation, will require medication.  Check metabolic panel.    Inflammatory arthritis Followed by rheumatology.  On humira and MTX.  Doing well.    Thrombocytosis ([place]) Follow cbc.   Stress Increased stress as outlined.  On citalopram - two 10mg  tablets per day.  Doing well on this dose.  Wants to remain on this dose for now.  Follow.     I discussed the assessment and treatment plan with the patient. The patient was provided an opportunity to ask questions and all were answered. The patient agreed with the plan and demonstrated an understanding of the instructions.   The patient was advised to call back or seek an in-person evaluation if the symptoms worsen or if the condition fails to improve as anticipated.    Einar Pheasant, MD

## 2019-04-05 ENCOUNTER — Encounter: Payer: Self-pay | Admitting: Internal Medicine

## 2019-04-05 ENCOUNTER — Other Ambulatory Visit: Payer: Self-pay

## 2019-04-05 ENCOUNTER — Other Ambulatory Visit (INDEPENDENT_AMBULATORY_CARE_PROVIDER_SITE_OTHER): Payer: Commercial Managed Care - PPO

## 2019-04-05 DIAGNOSIS — F439 Reaction to severe stress, unspecified: Secondary | ICD-10-CM | POA: Insufficient documentation

## 2019-04-05 DIAGNOSIS — D72829 Elevated white blood cell count, unspecified: Secondary | ICD-10-CM

## 2019-04-05 DIAGNOSIS — D473 Essential (hemorrhagic) thrombocythemia: Secondary | ICD-10-CM

## 2019-04-05 DIAGNOSIS — Z13220 Encounter for screening for lipoid disorders: Secondary | ICD-10-CM | POA: Diagnosis not present

## 2019-04-05 DIAGNOSIS — D75839 Thrombocytosis, unspecified: Secondary | ICD-10-CM

## 2019-04-05 LAB — CBC WITH DIFFERENTIAL/PLATELET
Basophils Absolute: 0.2 10*3/uL — ABNORMAL HIGH (ref 0.0–0.1)
Basophils Relative: 2.1 % (ref 0.0–3.0)
Eosinophils Absolute: 0.2 10*3/uL (ref 0.0–0.7)
Eosinophils Relative: 2.1 % (ref 0.0–5.0)
HCT: 45 % (ref 36.0–46.0)
Hemoglobin: 15.4 g/dL — ABNORMAL HIGH (ref 12.0–15.0)
Lymphocytes Relative: 45.2 % (ref 12.0–46.0)
Lymphs Abs: 3.4 10*3/uL (ref 0.7–4.0)
MCHC: 34.1 g/dL (ref 30.0–36.0)
MCV: 95.5 fl (ref 78.0–100.0)
Monocytes Absolute: 0.9 10*3/uL (ref 0.1–1.0)
Monocytes Relative: 11.4 % (ref 3.0–12.0)
Neutro Abs: 3 10*3/uL (ref 1.4–7.7)
Neutrophils Relative %: 39.2 % — ABNORMAL LOW (ref 43.0–77.0)
Platelets: 413 10*3/uL — ABNORMAL HIGH (ref 150.0–400.0)
RBC: 4.71 Mil/uL (ref 3.87–5.11)
RDW: 14 % (ref 11.5–15.5)
WBC: 7.6 10*3/uL (ref 4.0–10.5)

## 2019-04-05 LAB — HEPATIC FUNCTION PANEL
ALT: 19 U/L (ref 0–35)
AST: 21 U/L (ref 0–37)
Albumin: 4.2 g/dL (ref 3.5–5.2)
Alkaline Phosphatase: 105 U/L (ref 39–117)
Bilirubin, Direct: 0.1 mg/dL (ref 0.0–0.3)
Total Bilirubin: 0.3 mg/dL (ref 0.2–1.2)
Total Protein: 7.3 g/dL (ref 6.0–8.3)

## 2019-04-05 LAB — BASIC METABOLIC PANEL
BUN: 10 mg/dL (ref 6–23)
CO2: 28 mEq/L (ref 19–32)
Calcium: 9.3 mg/dL (ref 8.4–10.5)
Chloride: 102 mEq/L (ref 96–112)
Creatinine, Ser: 0.77 mg/dL (ref 0.40–1.20)
GFR: 78.25 mL/min (ref >60.00)
Glucose, Bld: 69 mg/dL — ABNORMAL LOW (ref 70–99)
Potassium: 4 mEq/L (ref 3.5–5.1)
Sodium: 137 mEq/L (ref 135–145)

## 2019-04-05 LAB — TSH: TSH: 1.57 u[IU]/mL (ref 0.35–4.50)

## 2019-04-05 LAB — LIPID PANEL
Cholesterol: 164 mg/dL (ref 0–200)
HDL: 43.5 mg/dL (ref >39.00)
LDL Cholesterol: 88 mg/dL (ref 0–99)
NonHDL: 120.12
Total CHOL/HDL Ratio: 4
Triglycerides: 163 mg/dL — ABNORMAL HIGH (ref 0.0–149.0)
VLDL: 32.6 mg/dL (ref 0.0–40.0)

## 2019-04-05 MED ORDER — CITALOPRAM HYDROBROMIDE 10 MG PO TABS: ORAL_TABLET | ORAL | 0 refills | Status: DC

## 2019-04-05 NOTE — Assessment & Plan Note (Signed)
Blood pressure as outlined.  Have her spot check her pressure.  Call in readings. If persistent elevation, will require medication.  Check metabolic panel.

## 2019-04-05 NOTE — Assessment & Plan Note (Signed)
Increased stress as outlined.  On citalopram - two 10mg  tablets per day.  Doing well on this dose.  Wants to remain on this dose for now.  Follow.

## 2019-04-05 NOTE — Assessment & Plan Note (Signed)
Followed by rheumatology.  On humira and MTX.  Doing well.

## 2019-04-05 NOTE — Assessment & Plan Note (Signed)
Follow cbc.  

## 2019-04-06 DIAGNOSIS — Z79899 Other long term (current) drug therapy: Secondary | ICD-10-CM | POA: Diagnosis not present

## 2019-04-06 DIAGNOSIS — M1380 Other specified arthritis, unspecified site: Secondary | ICD-10-CM | POA: Diagnosis not present

## 2019-04-15 ENCOUNTER — Encounter: Payer: Self-pay | Admitting: Internal Medicine

## 2019-05-05 ENCOUNTER — Ambulatory Visit
Admission: RE | Admit: 2019-05-05 | Discharge: 2019-05-05 | Disposition: A | Discharge status: Home / Self Care | Payer: Commercial Managed Care - PPO | Source: Ambulatory Visit | Attending: Internal Medicine | Admitting: Internal Medicine

## 2019-05-05 ENCOUNTER — Other Ambulatory Visit: Payer: Self-pay

## 2019-05-05 DIAGNOSIS — R928 Other abnormal and inconclusive findings on diagnostic imaging of breast: Secondary | ICD-10-CM | POA: Diagnosis not present

## 2019-05-05 DIAGNOSIS — Z1231 Encounter for screening mammogram for malignant neoplasm of breast: Secondary | ICD-10-CM | POA: Diagnosis not present

## 2019-05-05 DIAGNOSIS — Z1239 Encounter for other screening for malignant neoplasm of breast: Secondary | ICD-10-CM

## 2019-05-18 ENCOUNTER — Other Ambulatory Visit: Payer: Self-pay | Admitting: Internal Medicine

## 2019-05-18 DIAGNOSIS — R928 Other abnormal and inconclusive findings on diagnostic imaging of breast: Secondary | ICD-10-CM

## 2019-05-18 NOTE — Progress Notes (Signed)
Order placed for f/u right breast mammogram and ultrasound  

## 2019-05-25 ENCOUNTER — Ambulatory Visit
Admission: RE | Admit: 2019-05-25 | Discharge: 2019-05-25 | Disposition: A | Discharge status: Home / Self Care | Payer: Commercial Managed Care - PPO | Source: Ambulatory Visit | Attending: Internal Medicine | Admitting: Internal Medicine

## 2019-05-25 ENCOUNTER — Other Ambulatory Visit: Payer: Self-pay

## 2019-05-25 DIAGNOSIS — R928 Other abnormal and inconclusive findings on diagnostic imaging of breast: Secondary | ICD-10-CM | POA: Diagnosis not present

## 2019-06-02 ENCOUNTER — Other Ambulatory Visit: Payer: Self-pay | Admitting: Internal Medicine

## 2019-06-02 DIAGNOSIS — R928 Other abnormal and inconclusive findings on diagnostic imaging of breast: Secondary | ICD-10-CM

## 2019-06-02 NOTE — Progress Notes (Signed)
Order placed for surgery referral.  

## 2019-06-18 ENCOUNTER — Telehealth: Payer: Self-pay

## 2019-06-18 NOTE — Telephone Encounter (Signed)
Spoke with Nurse  At [facility] @  [place] [facility] office regarding abnormal mammogram and that we are no longer performing breast biopsies at this office and that PCP needs to refer patient to appropriate physician or place per Dr.Piscoya.

## 2019-06-18 NOTE — Telephone Encounter (Signed)
Dr Bary Castilla is no longer at [facility]. He was the only one that performed breast biopsies at this office.

## 2019-06-20 NOTE — Telephone Encounter (Signed)
Per note, they do not do breast biopsy's at [facility] (?).  I have placed the order for the referral.  Do I need to place another order for referral.  If so, then refer to Dr Peyton Najjar at [place].

## 2019-06-21 NOTE — Telephone Encounter (Signed)
No further referral needed. I switched the names and sent it to Dr. Deniece Ree office. Melissa

## 2019-06-28 ENCOUNTER — Telehealth: Payer: Self-pay | Admitting: Internal Medicine

## 2019-06-28 ENCOUNTER — Telehealth: Payer: Self-pay

## 2019-06-28 DIAGNOSIS — R928 Other abnormal and inconclusive findings on diagnostic imaging of breast: Secondary | ICD-10-CM

## 2019-06-28 NOTE — Telephone Encounter (Signed)
Because Dr. Peyton Najjar office does not do breast Biopsy none of the surgeons in this area so patient will need to be referred by to [facility] for the biopsy and then can be referred back to Dr. Einar Crow office per Freda Munro at [facility] .

## 2019-06-28 NOTE — Telephone Encounter (Signed)
Spoke with Juliann Pulse @ [place] [facility] office.  I let Juliann Pulse know that patient should be referred to [facility] breast center to have biopsy done then come back here to see Dr.Piscoya and that Dr.Cintron-Diaz does not do breast biopsies either.  I also asked patient to follow up with Juliann Pulse to have the referral /biopsy arranged and to call me back as soon as she has an appointment.

## 2019-06-29 ENCOUNTER — Ambulatory Visit: Payer: Commercial Managed Care - PPO | Admitting: Surgery

## 2019-06-29 ENCOUNTER — Ambulatory Visit: Payer: Commercial Managed Care - PPO | Admitting: General Surgery

## 2019-06-29 NOTE — Telephone Encounter (Signed)
Left message for patient to return call [facility] nurse may advise.

## 2019-06-29 NOTE — Telephone Encounter (Signed)
Order placed for surgery referral.  

## 2019-06-29 NOTE — Telephone Encounter (Signed)
Pt called back and stated that she is willing to go to Dr Chuck Hint office in office .  She is onboard with what ever Dr Nicki Reaper thinks Best number [phone]

## 2019-06-29 NOTE — Telephone Encounter (Signed)
See if pt agreeable to be seen at Dr [facility] office in [place].  Just let her know that no one in town currently does breast biopsies.  Let me know and I will place the order.  Just want Dr Barry Dienes to review and see what is needed.

## 2019-06-30 NOTE — Telephone Encounter (Signed)
Spoke with patient today- she talked with Larene Beach from [place] office and was told she would be referred to a doctor in [place] due to no doctors in this area that do breast biopsies.  I spoke with Roselyn Reef at [place] who will speak with [facility] and call me back. The patient is concerned because she is being told so many things.

## 2019-06-30 NOTE — Telephone Encounter (Signed)
Left message for patient to return call to office regarding breast biopsy.

## 2019-07-01 ENCOUNTER — Other Ambulatory Visit: Payer: Self-pay | Admitting: Internal Medicine

## 2019-07-01 NOTE — Telephone Encounter (Signed)
Spoke with York Cerise from [place]- she stated Dr.Jordan Radiologist would communicate with Dr.Charlene Nicki Reaper regarding breast biopsy on this patient and to clear up any misunderstanding.  I spoke with patient and she verbalized understanding.

## 2019-07-02 ENCOUNTER — Telehealth: Payer: Self-pay | Admitting: *Deleted

## 2019-07-02 ENCOUNTER — Encounter: Payer: Self-pay | Admitting: Internal Medicine

## 2019-07-02 NOTE — Telephone Encounter (Signed)
Copied from [place] [id number]. Topic: General - Call Back - No Documentation >> Jul 02, 2019  9:25 AM Erick Blinks wrote: Pt called requesting call back from nurse, regarding Biopsy -[facility] scheduled on Dr. Barry Dienes 07/15/2019 10:30 am  [facility] scheduled 07/13/2019 1:30 pm  [facility]  Best contact: [phone] VM okay

## 2019-07-02 NOTE — Telephone Encounter (Signed)
Per previous message, I was told that "no one in town" did breast biopsies.  See previous message.  This referral was then changed to DR [facility].  If Dr Peyton Najjar does breast biopsies, I am ok to refer to him.

## 2019-07-02 NOTE — Telephone Encounter (Signed)
Left detailed message for pt 

## 2019-07-02 NOTE — Telephone Encounter (Signed)
See my chart encounter.

## 2019-07-07 ENCOUNTER — Ambulatory Visit: Payer: Commercial Managed Care - PPO | Admitting: Internal Medicine

## 2019-07-08 ENCOUNTER — Ambulatory Visit: Payer: Commercial Managed Care - PPO | Admitting: Internal Medicine

## 2019-07-26 ENCOUNTER — Other Ambulatory Visit: Payer: Self-pay | Admitting: General Surgery

## 2019-07-26 DIAGNOSIS — R928 Other abnormal and inconclusive findings on diagnostic imaging of breast: Secondary | ICD-10-CM

## 2019-07-28 ENCOUNTER — Ambulatory Visit
Admission: RE | Admit: 2019-07-28 | Discharge: 2019-07-28 | Disposition: A | Discharge status: Home / Self Care | Payer: Commercial Managed Care - PPO | Source: Ambulatory Visit | Attending: General Surgery | Admitting: General Surgery

## 2019-07-28 DIAGNOSIS — R928 Other abnormal and inconclusive findings on diagnostic imaging of breast: Secondary | ICD-10-CM

## 2019-07-28 HISTORY — PX: BREAST BIOPSY: SHX20

## 2019-07-29 LAB — SURGICAL PATHOLOGY

## 2019-08-16 ENCOUNTER — Encounter: Payer: Self-pay | Admitting: Internal Medicine

## 2019-08-16 ENCOUNTER — Other Ambulatory Visit: Payer: Self-pay

## 2019-08-16 ENCOUNTER — Ambulatory Visit (INDEPENDENT_AMBULATORY_CARE_PROVIDER_SITE_OTHER): Payer: Commercial Managed Care - PPO | Admitting: Internal Medicine

## 2019-08-16 DIAGNOSIS — M1990 Unspecified osteoarthritis, unspecified site: Secondary | ICD-10-CM

## 2019-08-16 DIAGNOSIS — D649 Anemia, unspecified: Secondary | ICD-10-CM

## 2019-08-16 DIAGNOSIS — Z1211 Encounter for screening for malignant neoplasm of colon: Secondary | ICD-10-CM

## 2019-08-16 DIAGNOSIS — F439 Reaction to severe stress, unspecified: Secondary | ICD-10-CM | POA: Diagnosis not present

## 2019-08-16 DIAGNOSIS — R21 Rash and other nonspecific skin eruption: Secondary | ICD-10-CM

## 2019-08-16 DIAGNOSIS — Z124 Encounter for screening for malignant neoplasm of cervix: Secondary | ICD-10-CM

## 2019-08-16 DIAGNOSIS — D473 Essential (hemorrhagic) thrombocythemia: Secondary | ICD-10-CM

## 2019-08-16 DIAGNOSIS — M1380 Other specified arthritis, unspecified site: Secondary | ICD-10-CM

## 2019-08-16 DIAGNOSIS — D75839 Thrombocytosis, unspecified: Secondary | ICD-10-CM

## 2019-08-16 MED ORDER — CITALOPRAM HYDROBROMIDE 20 MG PO TABS: 20.0000 mg | ORAL_TABLET | Freq: Every day | ORAL | 1 refills | Status: DC

## 2019-08-16 MED ORDER — NYSTATIN 100000 UNIT/GM EX CREA: 1.0000 "application " | TOPICAL_CREAM | Freq: Two times a day (BID) | CUTANEOUS | 0 refills | Status: DC

## 2019-08-16 NOTE — Progress Notes (Deleted)
Patient ID: NAYSHA SHOLL, female   DOB: 10-16-1966, 53 y.o.   MRN: [id number]   Subjective:    Patient ID: KERINA SIMONEAU, female    DOB: 1966-10-09, 53 y.o.   MRN: [id number]  HPI  Patient here for a scheduled follow up.   Past Medical History:  Diagnosis Date  . Allergy   . Anemia   . Anxiety    due to heavy bleeding and SOB  . Arthritis   . Cancer ([place]) 2004   melanoma  . Heavy menstrual bleeding   . Rheumatoid arthritis ([place])   . Seasonal allergies   . Shortness of breath dyspnea    "due to anemia"   Past Surgical History:  Procedure Laterality Date  . ABDOMINAL HYSTERECTOMY    . APPENDECTOMY  2001   right tube and ovary removed also.  Marland Kitchen BREAST BIOPSY Right 07/28/2019   [place] bx, path pending  . CESAREAN SECTION     x2  . CHOLECYSTECTOMY    . CYSTOSCOPY N/A 03/12/2016   Procedure: CYSTOSCOPY;  Surgeon: Will Bonnet, MD;  Location: [facility] ORS;  Service: Gynecology;  Laterality: N/A;  . DILATATION & CURETTAGE/HYSTEROSCOPY WITH MYOSURE N/A 02/06/2016   Procedure: DILATATION & CURETTAGE HYSTEROSCOPY  polypectomy;  Surgeon: Will Bonnet, MD;  Location: [facility] OR[facility];  Service: Gynecology;  Laterality: N/A;  . POLYPECTOMY N/A 02/06/2016   Procedure: POLYPECTOMY;  Surgeon: Will Bonnet, MD;  Location: [facility] OR[facility];  Service: Gynecology;  Laterality: N/A;  . ROBOTIC ASSISTED TOTAL HYSTERECTOMY WITH SALPINGECTOMY Bilateral 03/12/2016   Converted to total abdominal hysterectom  . SALPINGOOPHORECTOMY Right    during appendectomy.   Family History  Problem Relation Age of Onset  . Hypertension Mother   . Hypertension Father   . Heart failure Father   . Cancer Father   . Breast cancer Neg Hx    Social History   Socioeconomic History  . Marital status: Married    Spouse name: Not on file  . Number of children: Not on file  . Years of education: Not on file  . Highest education level: Not on file  Occupational History  . Not on file  Social Needs  . Financial  resource strain: Not on file  . Food insecurity    Worry: Not on file    Inability: Not on file  . Transportation needs    Medical: Not on file    Non-medical: Not on file  Tobacco Use  . Smoking status: Never Smoker  . Smokeless tobacco: Never Used  Substance and Sexual Activity  . Alcohol use: No  . Drug use: No  . Sexual activity: Not on file  Lifestyle  . Physical activity    Days per week: Not on file    Minutes per session: Not on file  . Stress: Not on file  Relationships  . Social Herbalist on phone: Not on file    Gets together: Not on file    Attends religious service: Not on file    Active member of club or organization: Not on file    Attends meetings of clubs or organizations: Not on file    Relationship status: Not on file  Other Topics Concern  . Not on file  Social History Narrative  . Not on file     Review of Systems     Objective:    Physical Exam  LMP 10/23/2015 Comment: bleeding since 10/23/15 Wt Readings from Last 3 Encounters:  12/31/18 200 lb 6.4 oz (90.9 kg)  12/11/18 204 lb 3.2 oz (92.6 kg)  05/11/18 180 lb (81.6 kg)     Lab Results  Component Value Date   WBC 7.6 04/05/2019   HGB 15.4 (H) 04/05/2019   HCT 45.0 04/05/2019   PLT 413.0 (H) 04/05/2019   GLUCOSE 69 (L) 04/05/2019   CHOL 164 04/05/2019   TRIG 163.0 (H) 04/05/2019   HDL 43.50 04/05/2019   LDLCALC 88 04/05/2019   ALT 19 04/05/2019   AST 21 04/05/2019   NA 137 04/05/2019   K 4.0 04/05/2019   CL 102 04/05/2019   CREATININE 0.77 04/05/2019   BUN 10 04/05/2019   CO2 28 04/05/2019   TSH 1.57 04/05/2019    Mm Clip Placement Right  Result Date: 07/28/2019 CLINICAL DATA:  Status post ultrasound-guided biopsy of an indeterminate mass in the lower inner quadrant of the RIGHT breast. EXAM: DIAGNOSTIC RIGHT MAMMOGRAM POST ULTRASOUND BIOPSY COMPARISON:  Previous exam(s). FINDINGS: Mammographic images were obtained following ultrasound guided biopsy of the  indeterminate mass in the RIGHT breast at the 5 o'clock axis. Coil shaped clip is appropriately positioned within the lower inner quadrant. IMPRESSION: Coil shaped clip is appropriately positioned within the lower inner quadrant of the RIGHT breast, corresponding to the targeted mass at the 5 o'clock axis. Final Assessment: Post Procedure Mammograms for Marker Placement Electronically Signed   By: Franki Cabot M.D.   On: 07/28/2019 08:59   [place] Rt Breast Bx W Loc Dev 1st Lesion Img Bx Spec US Guide  Addendum Date: 07/30/2019   ADDENDUM REPORT: 07/30/2019 11:04 ADDENDUM: PATHOLOGY revealed: BREAST, RIGHT AT 5:00, 5 CM FROM THE NIPPLE; - BENIGN MAMMARY PARENCHYMA WITH FIBROCYSTIC AND PAPILLARY APOCRINE CHANGE. - NEGATIVE FOR ATYPICAL PROLIFERATIVE BREAST DISEASE. Pathology results are CONCORDANT with imaging findings, per Dr. Franki Cabot. Pathology results were discussed with patient via telephone. The patient reported doing well after the biopsy with tenderness at the site. Post biopsy care instructions were reviewed and questions were answered. The patient was encouraged to call [facility] for any additional concerns. Patient was instructed to return for routine bilateral screening mammogram in one year. Addendum by Electa Sniff RN on 07/30/2019. Electronically Signed   By: Franki Cabot M.D.   On: 07/30/2019 11:04   Result Date: 07/30/2019 CLINICAL DATA:  Patient with an indeterminate mass in the RIGHT breast presents today for ultrasound-guided core biopsy. EXAM: ULTRASOUND GUIDED RIGHT BREAST CORE NEEDLE BIOPSY COMPARISON:  Previous exam(s). PROCEDURE: I met with the patient and we discussed the procedure of ultrasound-guided biopsy, including benefits and alternatives. We discussed the high likelihood of a successful procedure. We discussed the risks of the procedure including infection, bleeding, tissue injury, clip migration, and inadequate sampling. Informed written consent was given. The usual  time-out protocol was performed immediately prior to the procedure. Lesion quadrant: Lower inner quadrant Using sterile technique and 1% Lidocaine as local anesthetic, under direct ultrasound visualization, a 12 gauge spring-loaded device was used to perform biopsy of the RIGHT breast mass at the 5 o'clock axisusing a inferolateral approach. At the conclusion of the procedure, a coil shaped tissue marker clip was deployed into the biopsy cavity. Follow-up 2-view mammogram was performed and dictated separately. IMPRESSION: Ultrasound-guided biopsy of the RIGHT breast mass at the 5 o'clock axis. No apparent complications. Electronically Signed: By: Franki Cabot M.D. On: 07/28/2019 08:42       Assessment & Plan:   Problem List Items Addressed This Visit  None       Einar Pheasant, MD

## 2019-08-21 ENCOUNTER — Encounter: Payer: Self-pay | Admitting: Internal Medicine

## 2019-08-21 DIAGNOSIS — Z124 Encounter for screening for malignant neoplasm of cervix: Secondary | ICD-10-CM | POA: Insufficient documentation

## 2019-08-21 DIAGNOSIS — R21 Rash and other nonspecific skin eruption: Secondary | ICD-10-CM | POA: Insufficient documentation

## 2019-08-21 DIAGNOSIS — Z1211 Encounter for screening for malignant neoplasm of colon: Secondary | ICD-10-CM | POA: Insufficient documentation

## 2019-08-21 NOTE — Assessment & Plan Note (Signed)
Follow cbc.  

## 2019-08-21 NOTE — Assessment & Plan Note (Signed)
Rash under her breast.  Appears to be c/w yeast infection.  Nystatin cream.  Follow.

## 2019-08-21 NOTE — Assessment & Plan Note (Signed)
Increased stress.  Discussed with her today.  She is doing better.  Handling stress relatively well. On citalopram.  Will increase to 20mg  q day.  Follow.

## 2019-08-21 NOTE — Assessment & Plan Note (Signed)
Discussed the need for colonoscopy.  She wants to postpone.  Will notify me when agreeable.

## 2019-08-21 NOTE — Progress Notes (Signed)
Patient ID: Robin Kelly, female   DOB: Mar 28, 1966, 53 y.o.   MRN: [id number]   Virtual Visit via video Note  This visit type was conducted due to national recommendations for restrictions regarding the COVID-19 pandemic (e.g. social distancing).  This format is felt to be most appropriate for this patient at this time.  All issues noted in this document were discussed and addressed.  No physical exam was performed (except for noted visual exam findings with Video Visits).   I connected with Marta Antu by a video enabled telemedicine application and verified that I am speaking with the correct person using two identifiers. Location patient: home Location provider: work  Persons participating in the virtual visit: patient, provider  I discussed the limitations, risks, security and privacy concerns of performing an evaluation and management service by video and the availability of in person appointments.  The patient expressed understanding and agreed to proceed.   Reason for visit: scheduled follow up.   HPI: Patient here for a scheduled follow up. Increased stress with family medical issues.  Discussed with her today.  She feels she is handling things relatively well.  On citalopram. Feels is helping.  Occasionally will take 2 (10mg ) tablets.  Discussed increasing dose to 20mg  q day.  She is agreeable.  Being followed by rheumatology.  On MTX and humira.  Feels better.  Had previous negative breast biopsy.  Sees gyn - Dr Glennon Mac.  He plans to f/u with gyn for f/u breast, pelvic, pap smear and mammogram.  States blood pressure doing well.  No chest pain.  No sob.  No acid reflux.  No abdominal pain.  Bowels moving.  Due colonoscopy.  Needs f/u with GI.  Wants to see Dr Vicente Males.  Offered to make referral.  She wants to hold on referral.  Does have rash under breast.     ROS: See pertinent positives and negatives per HPI.  Past Medical History:  Diagnosis Date  . Allergy   . Anemia   . Anxiety     due to heavy bleeding and SOB  . Arthritis   . Cancer ([place]) 2004   melanoma  . Heavy menstrual bleeding   . Rheumatoid arthritis ([place])   . Seasonal allergies   . Shortness of breath dyspnea    "due to anemia"    Past Surgical History:  Procedure Laterality Date  . ABDOMINAL HYSTERECTOMY    . APPENDECTOMY  2001   right tube and ovary removed also.  Marland Kitchen BREAST BIOPSY Right 07/28/2019   [place] bx, path pending  . CESAREAN SECTION     x2  . CHOLECYSTECTOMY    . CYSTOSCOPY N/A 03/12/2016   Procedure: CYSTOSCOPY;  Surgeon: Will Bonnet, MD;  Location: [facility];  Service: Gynecology;  Laterality: N/A;  . DILATATION & CURETTAGE/HYSTEROSCOPY WITH MYOSURE N/A 02/06/2016   Procedure: DILATATION & CURETTAGE HYSTEROSCOPY  polypectomy;  Surgeon: Will Bonnet, MD;  Location: [facility];  Service: Gynecology;  Laterality: N/A;  . POLYPECTOMY N/A 02/06/2016   Procedure: POLYPECTOMY;  Surgeon: Will Bonnet, MD;  Location: [facility];  Service: Gynecology;  Laterality: N/A;  . ROBOTIC ASSISTED TOTAL HYSTERECTOMY WITH SALPINGECTOMY Bilateral 03/12/2016   Converted to total abdominal hysterectom  . SALPINGOOPHORECTOMY Right    during appendectomy.    Family History  Problem Relation Age of Onset  . Hypertension Mother   . Hypertension Father   . Heart failure Father   . Cancer Father   . Breast cancer  Neg Hx     SOCIAL HX: reviewed.    Current Outpatient Medications:  .  Adalimumab (HUMIRA PEN) 40 MG/0.8ML PNKT, Inject 40 mg into the skin every 14 (fourteen) days., Disp: , Rfl:  .  aspirin 325 MG tablet, Take 325 mg by mouth daily., Disp: , Rfl:  .  ferrous sulfate 325 (65 FE) MG tablet, Take 325 mg by mouth every other day., Disp: , Rfl:  .  folic acid (FOLVITE) 1 MG tablet, Take 1 mg by mouth daily., Disp: , Rfl:  .  methotrexate 50 MG/2ML injection, Inject 1 mL as directed once a week. For 12 weeks, Disp: , Rfl:  .  citalopram (CELEXA) 20 MG tablet, Take 1 tablet (20 mg total) by  mouth daily., Disp: 30 tablet, Rfl: 1 .  nystatin cream (MYCOSTATIN), Apply 1 application topically 2 (two) times daily., Disp: 30 g, Rfl: 0  EXAM:  VITALS per patient if applicable:  123XX123  GENERAL: alert, oriented, appears well and in no acute distress  HEENT: atraumatic, conjunttiva clear, no obvious abnormalities on inspection of external nose and ears  NECK: normal movements of the head and neck  LUNGS: on inspection no signs of respiratory distress, breathing rate appears normal, no obvious gross SOB, gasping or wheezing  CV: no obvious cyanosis  SKIN:  Erythematous rash - under breast.    PSYCH/NEURO: pleasant and cooperative, no obvious depression or anxiety, speech and thought processing grossly intact  ASSESSMENT AND PLAN:  Discussed the following assessment and plan:  Anemia Follow cbc.    Inflammatory arthritis Followed by rheumatology.  On humira and MTX.  Doing better.  Follow.    Stress Increased stress.  Discussed with her today.  She is doing better.  Handling stress relatively well. On citalopram.  Will increase to 20mg  q day.  Follow.    Thrombocytosis ([place]) Follow cbc.   Rash Rash under her breast.  Appears to be c/w yeast infection.  Nystatin cream.  Follow.    Colon cancer screening Discussed the need for colonoscopy.  She wants to postpone.  Will notify me when agreeable.   Cervical cancer screening Overdue f/u with gyn.  Plans to contact Dr Glennon Mac for f/u gyn exam.      I discussed the assessment and treatment plan with the patient. The patient was provided an opportunity to ask questions and all were answered. The patient agreed with the plan and demonstrated an understanding of the instructions.   The patient was advised to call back or seek an in-person evaluation if the symptoms worsen or if the condition fails to improve as anticipated.   Einar Pheasant, MD

## 2019-08-21 NOTE — Assessment & Plan Note (Signed)
Overdue f/u with gyn.  Plans to contact Dr Glennon Mac for f/u gyn exam.

## 2019-08-21 NOTE — Assessment & Plan Note (Signed)
Followed by rheumatology.  On humira and MTX.  Doing better.  Follow.

## 2019-08-26 ENCOUNTER — Telehealth: Payer: Self-pay | Admitting: Internal Medicine

## 2019-08-26 NOTE — Telephone Encounter (Signed)
Patient states she finished nystatin cream (MYCOSTATIN) and was advised to let PCP know if breast rash didn't improve an alternate much stronger Rx would be sent in.   [id number] - [address], [place] [phone] (Phone) [phone] (Fax)

## 2019-08-27 MED ORDER — NYSTATIN 100000 UNIT/GM EX CREA: 1.0000 "application " | TOPICAL_CREAM | Freq: Two times a day (BID) | CUTANEOUS | 0 refills | Status: DC

## 2019-08-27 NOTE — Telephone Encounter (Signed)
I sent in another tube. See if resolves.  If does not, will need to see dermatology.

## 2019-08-27 NOTE — Telephone Encounter (Signed)
Left a detailed message letting pt know that medication has been sent in but if this doesn't help then she will need to see dermatology.

## 2019-08-27 NOTE — Telephone Encounter (Signed)
If her rash did not improve, I would like to have dermatology evaluate to confirm cause of rash.  If agreeable, let me know and I will place the order for the referral.

## 2019-09-07 ENCOUNTER — Other Ambulatory Visit: Payer: Self-pay | Admitting: Internal Medicine

## 2019-09-10 ENCOUNTER — Telehealth: Payer: Self-pay | Admitting: *Deleted

## 2019-09-10 NOTE — Telephone Encounter (Signed)
Please place future orders for lab appt.  

## 2019-09-11 ENCOUNTER — Other Ambulatory Visit: Payer: Self-pay | Admitting: Internal Medicine

## 2019-09-11 DIAGNOSIS — D473 Essential (hemorrhagic) thrombocythemia: Secondary | ICD-10-CM

## 2019-09-11 DIAGNOSIS — M1990 Unspecified osteoarthritis, unspecified site: Secondary | ICD-10-CM

## 2019-09-11 DIAGNOSIS — E7800 Pure hypercholesterolemia, unspecified: Secondary | ICD-10-CM

## 2019-09-11 DIAGNOSIS — D75839 Thrombocytosis, unspecified: Secondary | ICD-10-CM

## 2019-09-11 NOTE — Progress Notes (Signed)
Orders placed for f/u labs.  

## 2019-09-11 NOTE — Telephone Encounter (Signed)
Orders placed for labs

## 2019-09-14 ENCOUNTER — Other Ambulatory Visit (INDEPENDENT_AMBULATORY_CARE_PROVIDER_SITE_OTHER): Payer: Commercial Managed Care - PPO

## 2019-09-14 ENCOUNTER — Other Ambulatory Visit: Payer: Self-pay

## 2019-09-14 DIAGNOSIS — D75839 Thrombocytosis, unspecified: Secondary | ICD-10-CM

## 2019-09-14 DIAGNOSIS — M1990 Unspecified osteoarthritis, unspecified site: Secondary | ICD-10-CM | POA: Diagnosis not present

## 2019-09-14 DIAGNOSIS — E7800 Pure hypercholesterolemia, unspecified: Secondary | ICD-10-CM

## 2019-09-14 DIAGNOSIS — D473 Essential (hemorrhagic) thrombocythemia: Secondary | ICD-10-CM

## 2019-09-14 LAB — LIPID PANEL
Cholesterol: 170 mg/dL (ref 0–200)
HDL: 38.8 mg/dL — ABNORMAL LOW (ref >39.00)
LDL Cholesterol: 98 mg/dL (ref 0–99)
NonHDL: 131.02
Total CHOL/HDL Ratio: 4
Triglycerides: 164 mg/dL — ABNORMAL HIGH (ref 0.0–149.0)
VLDL: 32.8 mg/dL (ref 0.0–40.0)

## 2019-09-14 LAB — BASIC METABOLIC PANEL
BUN: 12 mg/dL (ref 6–23)
CO2: 25 mEq/L (ref 19–32)
Calcium: 9.4 mg/dL (ref 8.4–10.5)
Chloride: 101 mEq/L (ref 96–112)
Creatinine, Ser: 0.75 mg/dL (ref 0.40–1.20)
GFR: 80.53 mL/min (ref >60.00)
Glucose, Bld: 134 mg/dL — ABNORMAL HIGH (ref 70–99)
Potassium: 4.2 mEq/L (ref 3.5–5.1)
Sodium: 136 mEq/L (ref 135–145)

## 2019-09-14 LAB — CBC WITH DIFFERENTIAL/PLATELET
Basophils Absolute: 0.1 10*3/uL (ref 0.0–0.1)
Basophils Relative: 1 % (ref 0.0–3.0)
Eosinophils Absolute: 0.1 10*3/uL (ref 0.0–0.7)
Eosinophils Relative: 1.8 % (ref 0.0–5.0)
HCT: 42.8 % (ref 36.0–46.0)
Hemoglobin: 14.6 g/dL (ref 12.0–15.0)
Lymphocytes Relative: 49.9 % — ABNORMAL HIGH (ref 12.0–46.0)
Lymphs Abs: 3.3 10*3/uL (ref 0.7–4.0)
MCHC: 34.1 g/dL (ref 30.0–36.0)
MCV: 94.9 fl (ref 78.0–100.0)
Monocytes Absolute: 0.6 10*3/uL (ref 0.1–1.0)
Monocytes Relative: 8.9 % (ref 3.0–12.0)
Neutro Abs: 2.5 10*3/uL (ref 1.4–7.7)
Neutrophils Relative %: 38.4 % — ABNORMAL LOW (ref 43.0–77.0)
Platelets: 382 10*3/uL (ref 150.0–400.0)
RBC: 4.52 Mil/uL (ref 3.87–5.11)
RDW: 14.1 % (ref 11.5–15.5)
WBC: 6.6 10*3/uL (ref 4.0–10.5)

## 2019-09-14 LAB — HEPATIC FUNCTION PANEL
ALT: 15 U/L (ref 0–35)
AST: 17 U/L (ref 0–37)
Albumin: 4.2 g/dL (ref 3.5–5.2)
Alkaline Phosphatase: 100 U/L (ref 39–117)
Bilirubin, Direct: 0.1 mg/dL (ref 0.0–0.3)
Total Bilirubin: 0.4 mg/dL (ref 0.2–1.2)
Total Protein: 6.9 g/dL (ref 6.0–8.3)

## 2019-09-18 ENCOUNTER — Other Ambulatory Visit: Payer: Self-pay | Admitting: Internal Medicine

## 2019-09-18 ENCOUNTER — Encounter: Payer: Self-pay | Admitting: Internal Medicine

## 2019-09-18 DIAGNOSIS — R739 Hyperglycemia, unspecified: Secondary | ICD-10-CM

## 2019-09-18 NOTE — Progress Notes (Signed)
Order placed for f/u fasting labs.  

## 2019-10-04 ENCOUNTER — Ambulatory Visit: Payer: Commercial Managed Care - PPO | Admitting: Obstetrics and Gynecology

## 2019-10-15 ENCOUNTER — Other Ambulatory Visit: Payer: Self-pay

## 2019-10-19 ENCOUNTER — Other Ambulatory Visit: Payer: Commercial Managed Care - PPO

## 2019-10-22 ENCOUNTER — Encounter: Payer: Self-pay | Admitting: Emergency Medicine

## 2019-10-22 ENCOUNTER — Other Ambulatory Visit: Payer: Self-pay

## 2019-10-22 ENCOUNTER — Ambulatory Visit
Admission: EM | Admit: 2019-10-22 | Discharge: 2019-10-22 | Disposition: A | Discharge status: Home / Self Care | Payer: Commercial Managed Care - PPO | Attending: Family Medicine | Admitting: Family Medicine

## 2019-10-22 DIAGNOSIS — Z20828 Contact with and (suspected) exposure to other viral communicable diseases: Secondary | ICD-10-CM | POA: Diagnosis not present

## 2019-10-22 DIAGNOSIS — K121 Other forms of stomatitis: Secondary | ICD-10-CM | POA: Diagnosis not present

## 2019-10-22 DIAGNOSIS — Z20822 Contact with and (suspected) exposure to covid-19: Secondary | ICD-10-CM

## 2019-10-22 DIAGNOSIS — K1230 Oral mucositis (ulcerative), unspecified: Secondary | ICD-10-CM

## 2019-10-22 MED ORDER — NYSTATIN 100000 UNIT/ML MT SUSP: 500000.0000 [IU] | Freq: Four times a day (QID) | OROMUCOSAL | 0 refills | Status: DC

## 2019-10-22 NOTE — ED Provider Notes (Signed)
[facility]    CSN: [id number] Arrival date & time: 10/22/19  1147      History   Chief Complaint Chief Complaint  Patient presents with  . Mouth Lesions    APPT    HPI Robin Kelly is a 53 y.o. female.   Subjective:   Robin Kelly is a 53 y.o. female here for evaluation of a cough. The cough is productive of clear sputum. The cough is waxing and waning over time and is aggravated by nothing. Onset of symptoms was 9 days ago and has been gradually improving since that time.  Associated symptoms include mouth sores and tongue burning for the past 2 days. She's also had pain with eating/drinking as well as change in taste. Symptoms improved with zyrtec and delsym. Patient does not have a history of asthma. Patient has not had recent travel. Patient does not have a history of smoking. Patient denies any fevers, chills, sweats, body aches, sore throat, shortness of breath, nausea, vomiting, diarrhea, headache, dizziness or change in smell. No known exposure to COVID-19.  She does have a history of rheumatoid arthritis and is on several different medications for this.  The following portions of the patient's history were reviewed and updated as appropriate: allergies, current medications, past family history, past medical history, past social history, past surgical history and problem list.    }      Past Medical History:  Diagnosis Date  . Allergy   . Anemia   . Anxiety    due to heavy bleeding and SOB  . Arthritis   . Cancer ([place]) 2004   melanoma  . Heavy menstrual bleeding   . Rheumatoid arthritis ([place])   . Seasonal allergies   . Shortness of breath dyspnea    "due to anemia"    Patient Active Problem List   Diagnosis Date Noted  . Rash 08/21/2019  . Colon cancer screening 08/21/2019  . Cervical cancer screening 08/21/2019  . Stress 04/05/2019  . Leukocytosis 12/13/2018  . Seronegative arthritis 12/13/2018  . Inflammatory arthritis 12/13/2018   . Elevated blood pressure reading 12/13/2018  . Thrombocytosis ([place]) 10/13/2017  . Health care maintenance 04/20/2017  . Left knee pain 03/09/2017  . Pain of right heel 09/15/2016  . Anemia 03/14/2016  . Status post abdominal hysterectomy 03/12/2016  . Status post hysterectomy 03/12/2016  . Menorrhagia with irregular cycle 02/06/2016  . Endometrial polyp 02/06/2016  . Fibroids 02/06/2016    Past Surgical History:  Procedure Laterality Date  . ABDOMINAL HYSTERECTOMY    . APPENDECTOMY  2001   right tube and ovary removed also.  Marland Kitchen BREAST BIOPSY Right 07/28/2019   [place] bx, path pending  . CESAREAN SECTION     x2  . CHOLECYSTECTOMY    . CYSTOSCOPY N/A 03/12/2016   Procedure: CYSTOSCOPY;  Surgeon: Will Bonnet, MD;  Location: [facility];  Service: Gynecology;  Laterality: N/A;  . DILATATION & CURETTAGE/HYSTEROSCOPY WITH MYOSURE N/A 02/06/2016   Procedure: DILATATION & CURETTAGE HYSTEROSCOPY  polypectomy;  Surgeon: Will Bonnet, MD;  Location: [facility];  Service: Gynecology;  Laterality: N/A;  . POLYPECTOMY N/A 02/06/2016   Procedure: POLYPECTOMY;  Surgeon: Will Bonnet, MD;  Location: [facility];  Service: Gynecology;  Laterality: N/A;  . ROBOTIC ASSISTED TOTAL HYSTERECTOMY WITH SALPINGECTOMY Bilateral 03/12/2016   Converted to total abdominal hysterectom  . SALPINGOOPHORECTOMY Right    during appendectomy.    OB History   No obstetric history on file.  Home Medications    Prior to Admission medications   Medication Sig Start Date End Date Taking? Authorizing Provider  Adalimumab (HUMIRA PEN) 40 MG/0.8ML PNKT Inject 40 mg into the skin every 14 (fourteen) days.   Yes [provider]  aspirin 325 MG tablet Take 325 mg by mouth daily.   Yes [provider]  citalopram (CELEXA) 20 MG tablet TAKE 1 TABLET BY MOUTH EVERY DAY 09/07/19  Yes Einar Pheasant, MD  ferrous sulfate 325 (65 FE) MG tablet Take 325 mg by mouth every other day.   Yes [provider]  folic acid (FOLVITE) 1 MG tablet Take 1 mg by mouth daily.   Yes [provider]  methotrexate 50 MG/2ML injection Inject 1 mL as directed once a week. For 12 weeks 03/24/18  Yes [provider]  nystatin cream (MYCOSTATIN) Apply 1 application topically 2 (two) times daily. 08/27/19  Yes Einar Pheasant, MD  nystatin (MYCOSTATIN) 100000 UNIT/ML suspension Take 5 mLs (500,000 Units total) by mouth 4 (four) times daily. 10/22/19   Enrique Sack, FNP    Family History Family History  Problem Relation Age of Onset  . Hypertension Mother   . Hypertension Father   . Heart failure Father   . Cancer Father   . Breast cancer Neg Hx     Social History Social History   Tobacco Use  . Smoking status: Never Smoker  . Smokeless tobacco: Never Used  Substance Use Topics  . Alcohol use: No  . Drug use: No     Allergies   Penicillins and Oxycodone   Review of Systems Review of Systems  Constitutional: Negative for fever.  HENT: Positive for mouth sores. Negative for sore throat.   Respiratory: Positive for cough. Negative for shortness of breath.   Gastrointestinal: Negative for nausea and vomiting.  Musculoskeletal: Negative for myalgias.  Skin: Negative for rash.  Neurological: Negative for dizziness and headaches.  All other systems reviewed and are negative.    Physical Exam Triage Vital Signs ED Triage Vitals  Enc Vitals Group     BP 10/22/19 1306 (!) 151/87     Pulse Rate 10/22/19 1306 74     Resp 10/22/19 1306 18     Temp 10/22/19 1306 98.4 F (36.9 C)     Temp Source 10/22/19 1306 Oral     SpO2 10/22/19 1306 100 %     Weight 10/22/19 1304 188 lb (85.3 kg)     Height 10/22/19 1304 5\' 2"  (1.575 m)     Head Circumference --      Peak Flow --      Pain Score 10/22/19 1304 0     Pain Loc --      Pain Edu? --      Excl. in [place]? --    No data found.  Updated Vital Signs BP (!) 151/87 (BP Location: Right Arm)   Pulse 74   Temp  98.4 F (36.9 C) (Oral)   Resp 18   Ht 5\' 2"  (1.575 m)   Wt 188 lb (85.3 kg)   LMP 10/23/2015 Comment: bleeding since 10/23/15  SpO2 100%   BMI 34.39 kg/m   Visual Acuity Right Eye Distance:   Left Eye Distance:   Bilateral Distance:    Right Eye Near:   Left Eye Near:    Bilateral Near:     Physical Exam Vitals signs reviewed.  Constitutional:      Appearance: Normal appearance.  HENT:  Head: Normocephalic and atraumatic.     Nose: Nose normal.     Mouth/Throat:     Lips: Pink. No lesions.     Mouth: Mucous membranes are moist. Oral lesions present.     Dentition: No dental abscesses or gum lesions.     Tongue: Lesions present.     Pharynx: Oropharynx is clear. Uvula midline. No pharyngeal swelling, oropharyngeal exudate, posterior oropharyngeal erythema or uvula swelling.     Tonsils: No tonsillar exudate or tonsillar abscesses.  Eyes:     Extraocular Movements: Extraocular movements intact.     Conjunctiva/sclera: Conjunctivae normal.     Pupils: Pupils are equal, round, and reactive to light.  Neck:     Musculoskeletal: Normal range of motion and neck supple.  Cardiovascular:     Rate and Rhythm: Normal rate and regular rhythm.  Pulmonary:     Effort: Pulmonary effort is normal.     Breath sounds: Normal breath sounds.  Musculoskeletal: Normal range of motion.  Lymphadenopathy:     Cervical: No cervical adenopathy.  Skin:    General: Skin is warm.  Neurological:     General: No focal deficit present.     Mental Status: She is alert and oriented to person, place, and time.  Psychiatric:        Mood and Affect: Mood normal.      UC Treatments / Results  Labs (all labs ordered are listed, but only abnormal results are displayed) Labs Reviewed  NOVEL CORONAVIRUS, NAA (HOSP ORDER, SEND-OUT TO REF LAB; TAT 18-24 HRS)    EKG   Radiology No results found.  Procedures Procedures (including critical care time)  Medications Ordered in UC  Medications - No data to display  Initial Impression / Assessment and Plan / UC Course  I have reviewed the triage vital signs and the nursing notes.  Pertinent labs & imaging results that were available during my care of the patient were reviewed by me and considered in my medical decision making (see chart for details).    53 year old female with a history of rheumatoid arthritis presenting with 9-day history of improving cough as well as mouth sores/tongue burning for the past 2 days.  This has affected her ability to taste her food. No fevers, chills, sweats, body aches, sore throat, shortness of breath, nausea, vomiting, diarrhea, headache, dizziness or change in smell.  Patient is afebrile.  Nontoxic-appearing.  No known exposure to COVID-19. Lesions noted within the inner lip, buccal mucosa and tongue.  Will treat with nystatin for probable stomatitis.  COVID-19 test pending.  Continue supportive care for cough.  Today's evaluation has revealed no signs of a dangerous process. Discussed diagnosis with patient and/or guardian. Patient and/or guardian aware of their diagnosis, possible red flag symptoms to watch out for and need for close follow up. Patient and/or guardian understands verbal and written discharge instructions. Patient and/or guardian comfortable with plan and disposition.  Patient and/or guardian has a clear mental status at this time, good insight into illness (after discussion and teaching) and has clear judgment to make decisions regarding their care  This care was provided during an unprecedented National Emergency due to the Novel Coronavirus (COVID-19) pandemic. COVID-19 infections and transmission risks place heavy strains on healthcare resources.  As this pandemic evolves, our facility, providers, and staff strive to respond fluidly, to remain operational, and to provide care relative to available resources and information. Outcomes are unpredictable and treatments are  without well-defined guidelines. Further, the  impact of COVID-19 on all aspects of urgent care, including the impact to patients seeking care for reasons other than COVID-19, is unavoidable during this national emergency. At this time of the global pandemic, management of patients has significantly changed, even for non-COVID positive patients given high local and regional COVID volumes at this time requiring high healthcare system and resource utilization. The standard of care for management of both COVID suspected and non-COVID suspected patients continues to change rapidly at the local, regional, national, and global levels. This patient was worked up and treated to the best available but ever changing evidence and resources available at this current time.   Documentation was completed with the aid of voice recognition software. Transcription may contain typographical errors.   Final Clinical Impressions(s) / UC Diagnoses   Final diagnoses:  Stomatitis and mucositis  Encounter for screening laboratory testing for COVID-19 virus     Discharge Instructions     Take medications as prescribed. You may take tylenol or ibuprofen as needed for fevers/headache/body aches. Drink plenty of fluids. Stay in home isolation until you receive results of your COVID test. You will only be notified for positive results. You may go online to MyChart in the next few days and review your results. Please follow CDC guidelines that are attached. You may discontinue home isolation when there has been at least 10 days since symptoms onset AND 3 days fever free without antipyretics (Tylenol or Ibuprofen) AND an overall improvement in your symptoms. Go to the ED immediately if you get worse or have any other symptoms.   Feel better soon!  Aldona Bar, FNP-C      ED Prescriptions    Medication Sig Dispense Auth. Provider   nystatin (MYCOSTATIN) 100000 UNIT/ML suspension Take 5 mLs (500,000 Units total) by mouth 4  (four) times daily. 60 mL Enrique Sack, FNP     PDMP not reviewed this encounter.   Enrique Sack, [place] 10/22/19 1410

## 2019-10-22 NOTE — ED Triage Notes (Signed)
Patient c/o white bumps in her mouth and on her tongue that started yesterday. She does report a cough and has been taking Delsym. She is concerned she has thrush.

## 2019-10-22 NOTE — Discharge Instructions (Addendum)
Take medications as prescribed. You may take tylenol or ibuprofen as needed for fevers/headache/body aches. Drink plenty of fluids. Stay in home isolation until you receive results of your COVID test. You will only be notified for positive results. You may go online to MyChart in the next few days and review your results. Please follow CDC guidelines that are attached. You may discontinue home isolation when there has been at least 10 days since symptoms onset AND 3 days fever free without antipyretics (Tylenol or Ibuprofen) AND an overall improvement in your symptoms. Go to the ED immediately if you get worse or have any other symptoms.   Feel better soon!  Sirius Woodford, FNP-C   

## 2019-10-23 LAB — NOVEL CORONAVIRUS, NAA (HOSP ORDER, SEND-OUT TO REF LAB; TAT 18-24 HRS): SARS-CoV-2, NAA: NOT DETECTED

## 2019-10-26 ENCOUNTER — Ambulatory Visit: Payer: Commercial Managed Care - PPO | Admitting: Obstetrics and Gynecology

## 2019-10-28 ENCOUNTER — Other Ambulatory Visit: Payer: Commercial Managed Care - PPO

## 2019-10-29 ENCOUNTER — Ambulatory Visit: Payer: Commercial Managed Care - PPO | Admitting: Internal Medicine

## 2019-11-10 NOTE — Telephone Encounter (Signed)
See phone note

## 2019-11-12 ENCOUNTER — Other Ambulatory Visit: Payer: Commercial Managed Care - PPO

## 2019-11-16 ENCOUNTER — Encounter: Payer: Self-pay | Admitting: Internal Medicine

## 2019-11-16 NOTE — Telephone Encounter (Signed)
I don't think they get charged for missed lab appts, but please make sure she does not get charged.  Also confirm she is doing ok and  notify her to let us know if she needs anything.

## 2019-11-17 NOTE — Telephone Encounter (Signed)
I do not mind helping her in what ever way.  See if she would be agreeable to do a virtual.  If able tomorrow am - let me know.  If not, can do today if needed.

## 2019-11-17 NOTE — Telephone Encounter (Signed)
Spoke with patient. Did not feel that she needed anything at this time.Confirmed doing ok given circumstances. She is scheduled for Jan 6. Husbands celebration of life is on 1/15. Going to discuss temporary medication at that time. Did not want to be worked in this week. Would prefer to wait until January.

## 2019-11-29 ENCOUNTER — Ambulatory Visit: Payer: Commercial Managed Care - PPO | Admitting: Obstetrics and Gynecology

## 2019-12-01 ENCOUNTER — Ambulatory Visit (INDEPENDENT_AMBULATORY_CARE_PROVIDER_SITE_OTHER): Payer: Commercial Managed Care - PPO | Admitting: Internal Medicine

## 2019-12-01 ENCOUNTER — Other Ambulatory Visit: Payer: Self-pay

## 2019-12-01 DIAGNOSIS — F439 Reaction to severe stress, unspecified: Secondary | ICD-10-CM

## 2019-12-01 DIAGNOSIS — M1990 Unspecified osteoarthritis, unspecified site: Secondary | ICD-10-CM | POA: Diagnosis not present

## 2019-12-01 DIAGNOSIS — D473 Essential (hemorrhagic) thrombocythemia: Secondary | ICD-10-CM

## 2019-12-01 DIAGNOSIS — D75839 Thrombocytosis, unspecified: Secondary | ICD-10-CM

## 2019-12-01 MED ORDER — CITALOPRAM HYDROBROMIDE 20 MG PO TABS: ORAL_TABLET | ORAL | 1 refills | Status: DC

## 2019-12-01 NOTE — Progress Notes (Signed)
Patient ID: Robin Kelly, female   DOB: [birth year], 54 y.o.   MRN: [id number]   Virtual Visit via video Note  This visit type was conducted due to national recommendations for restrictions regarding the COVID-19 pandemic (e.g. social distancing).  This format is felt to be most appropriate for this patient at this time.  All issues noted in this document were discussed and addressed.  No physical exam was performed (except for noted visual exam findings with Video Visits).   I connected with Robin Kelly by a video enabled telemedicine application and verified that I am speaking with the correct person using two identifiers. Location patient: home Location provider: work Persons participating in the virtual visit: patient, provider  The limitations, risks, security and privacy concerns of performing an evaluation and management service by video and the availability of in person appointments have been discussed.   The patient expressed understanding and agreed to proceed.  Reason for visit: work in appt  HPI: Increased stress in dealing with her husband's recent death.  Also trying to take care of legal issues associated with his passing.  Decreased appetite.  Not sleeping well.  Taking melatonin.  This is helping.   Has good family support.  On celexa.  Taking 20mg  daily now.  Feels needs to increase the dose.  Discussed increasing to 30mg  q day.  Physically  - things are stable.     ROS: See pertinent positives and negatives per HPI.  Past Medical History:  Diagnosis Date  . Allergy   . Anemia   . Anxiety    due to heavy bleeding and SOB  . Arthritis   . Cancer ([place]) 2004   melanoma  . Heavy menstrual bleeding   . Rheumatoid arthritis ([place])   . Seasonal allergies   . Shortness of breath dyspnea    "due to anemia"    Past Surgical History:  Procedure Laterality Date  . ABDOMINAL HYSTERECTOMY    . APPENDECTOMY  2001   right tube and ovary removed also.  Marland Kitchen BREAST BIOPSY Right  07/28/2019   [place] bx, path pending  . CESAREAN SECTION     x2  . CHOLECYSTECTOMY    . CYSTOSCOPY N/A 03/12/2016   Procedure: CYSTOSCOPY;  Surgeon: Will Bonnet, MD;  Location: [facility];  Service: Gynecology;  Laterality: N/A;  . DILATATION & CURETTAGE/HYSTEROSCOPY WITH MYOSURE N/A 02/06/2016   Procedure: DILATATION & CURETTAGE HYSTEROSCOPY  polypectomy;  Surgeon: Will Bonnet, MD;  Location: [facility];  Service: Gynecology;  Laterality: N/A;  . POLYPECTOMY N/A 02/06/2016   Procedure: POLYPECTOMY;  Surgeon: Will Bonnet, MD;  Location: [facility];  Service: Gynecology;  Laterality: N/A;  . ROBOTIC ASSISTED TOTAL HYSTERECTOMY WITH SALPINGECTOMY Bilateral 03/12/2016   Converted to total abdominal hysterectom  . SALPINGOOPHORECTOMY Right    during appendectomy.    Family History  Problem Relation Age of Onset  . Hypertension Mother   . Hypertension Father   . Heart failure Father   . Cancer Father   . Breast cancer Neg Hx     SOCIAL HX: reviewed.    Current Outpatient Medications:  .  Adalimumab (HUMIRA PEN) 40 MG/0.8ML PNKT, Inject 40 mg into the skin every 14 (fourteen) days., Disp: , Rfl:  .  aspirin 325 MG tablet, Take 325 mg by mouth daily., Disp: , Rfl:  .  citalopram (CELEXA) 20 MG tablet, Take 1 and 1/2 tablet q day., Disp: 45 tablet, Rfl: 1 .  ferrous sulfate 325 (  65 FE) MG tablet, Take 325 mg by mouth every other day., Disp: , Rfl:  .  folic acid (FOLVITE) 1 MG tablet, Take 1 mg by mouth daily., Disp: , Rfl:  .  methotrexate 50 MG/2ML injection, Inject 1 mL as directed once a week. For 12 weeks, Disp: , Rfl:  .  nystatin (MYCOSTATIN) 100000 UNIT/ML suspension, Take 5 mLs (500,000 Units total) by mouth 4 (four) times daily., Disp: 60 mL, Rfl: 0 .  nystatin cream (MYCOSTATIN), Apply 1 application topically 2 (two) times daily., Disp: 30 g, Rfl: 0  EXAM:  GENERAL: alert, oriented, appears well and in no acute distress  HEENT: atraumatic, conjunttiva clear, no  obvious abnormalities on inspection of external nose and ears  NECK: normal movements of the head and neck  LUNGS: on inspection no signs of respiratory distress, breathing rate appears normal, no obvious gross SOB, gasping or wheezing  CV: no obvious cyanosis  PSYCH/NEURO: pleasant and cooperative, no obvious depression or anxiety, speech and thought processing grossly intact  ASSESSMENT AND PLAN:  Discussed the following assessment and plan:  Stress Increased stress as outlined.  Discussed with her today.  Does not feel needs counseling.  Has good family support.  On celexa.  Will increase to 30mg  q day.  Follow.  Schedule appt soon to reassess.   Thrombocytosis ([facility]) Platelet count 08/2019 wnl.  Follow.    Inflammatory arthritis On humira and MTX.  Followed by rheumatology.     No orders of the defined types were placed in this encounter.   Meds ordered this encounter  Medications  . citalopram (CELEXA) 20 MG tablet    Sig: Take 1 and 1/2 tablet q day.    Dispense:  45 tablet    Refill:  1     I discussed the assessment and treatment plan with the patient. The patient was provided an opportunity to ask questions and all were answered. The patient agreed with the plan and demonstrated an understanding of the instructions.   The patient was advised to call back or seek an in-person evaluation if the symptoms worsen or if the condition fails to improve as anticipated.   Einar Pheasant, MD

## 2019-12-05 ENCOUNTER — Encounter: Payer: Self-pay | Admitting: Internal Medicine

## 2019-12-05 NOTE — Assessment & Plan Note (Signed)
On humira and MTX.  Followed by rheumatology.

## 2019-12-05 NOTE — Assessment & Plan Note (Signed)
Platelet count 08/2019 wnl.  Follow.

## 2019-12-05 NOTE — Assessment & Plan Note (Signed)
Increased stress as outlined.  Discussed with her today.  Does not feel needs counseling.  Has good family support.  On celexa.  Will increase to 30mg  q day.  Follow.  Schedule appt soon to reassess.

## 2019-12-10 ENCOUNTER — Ambulatory Visit: Payer: Commercial Managed Care - PPO | Admitting: Internal Medicine

## 2019-12-22 ENCOUNTER — Telehealth: Payer: Self-pay | Admitting: Internal Medicine

## 2019-12-22 NOTE — Telephone Encounter (Signed)
Pt called wanting a letter stating that her husband was going to be put on permanent disability before he passed away for the claim.. she stated that she would talk more about this at her appt

## 2019-12-24 NOTE — Telephone Encounter (Signed)
Pt called office 2 days ago. Pt called wanting a letter stating that her husband was going to be put on permanent disability before he passed away for the claim.. she stated that she would talk more about this at her appt

## 2019-12-24 NOTE — Telephone Encounter (Signed)
Discussed this with [place].  We completed FMLA paperwork on pt.  Was not on disability.  Larena Glassman had informed me she would call pt and discuss.  I can discuss this with you more.

## 2019-12-27 ENCOUNTER — Other Ambulatory Visit: Payer: Self-pay | Admitting: Internal Medicine

## 2019-12-29 ENCOUNTER — Other Ambulatory Visit: Payer: Self-pay

## 2019-12-29 ENCOUNTER — Ambulatory Visit (INDEPENDENT_AMBULATORY_CARE_PROVIDER_SITE_OTHER): Payer: Self-pay | Admitting: Internal Medicine

## 2019-12-29 DIAGNOSIS — F439 Reaction to severe stress, unspecified: Secondary | ICD-10-CM

## 2019-12-29 NOTE — Progress Notes (Signed)
Patient ID: Robin Kelly, female   DOB: December 09, 1965, 54 y.o.   MRN: [id number]   Virtual Visit via video Note  This visit type was conducted due to national recommendations for restrictions regarding the COVID-19 pandemic (e.g. social distancing).  This format is felt to be most appropriate for this patient at this time.  All issues noted in this document were discussed and addressed.  No physical exam was performed (except for noted visual exam findings with Video Visits).   I connected with Kristilyn Mcgiffin today by a video enabled telemedicine application and verified that I am speaking with the correct person using two identifiers. Location patient: home Location provider: work Persons participating in the virtual visit: patient, provider  The limitations, risks, security and privacy concerns of performing an evaluation and management service by video and the availability of in person appointments have been discussed. The patient expressed understanding and agreed to proceed.   Reason for visit:  Scheduled follow up.   HPI: Increased stress.  Still trying to cope with her husband's death.  Also increased stress with the paper work and legal issues related.  She has good support.  Taking citalopram.  Adjusting the dose.  Takes between 20mg  and 40mg .  On average - 30mg  per day.  Feels this is working well for her.  Trying to stay active.  Doing things around her house.  No chest pain or sob reported.  Bowels stable.  Request letter to document her husband's health issues.     ROS: See pertinent positives and negatives per HPI.  Past Medical History:  Diagnosis Date  . Allergy   . Anemia   . Anxiety    due to heavy bleeding and SOB  . Arthritis   . Cancer ([place]) 2004   melanoma  . Heavy menstrual bleeding   . Rheumatoid arthritis ([place])   . Seasonal allergies   . Shortness of breath dyspnea    "due to anemia"    Past Surgical History:  Procedure Laterality Date  . ABDOMINAL  HYSTERECTOMY    . APPENDECTOMY  2001   right tube and ovary removed also.  Marland Kitchen BREAST BIOPSY Right 07/28/2019   [place] bx, path pending  . CESAREAN SECTION     x2  . CHOLECYSTECTOMY    . CYSTOSCOPY N/A 03/12/2016   Procedure: CYSTOSCOPY;  Surgeon: Will Bonnet, MD;  Location: [facility];  Service: Gynecology;  Laterality: N/A;  . DILATATION & CURETTAGE/HYSTEROSCOPY WITH MYOSURE N/A 02/06/2016   Procedure: DILATATION & CURETTAGE HYSTEROSCOPY  polypectomy;  Surgeon: Will Bonnet, MD;  Location: [facility];  Service: Gynecology;  Laterality: N/A;  . POLYPECTOMY N/A 02/06/2016   Procedure: POLYPECTOMY;  Surgeon: Will Bonnet, MD;  Location: [facility];  Service: Gynecology;  Laterality: N/A;  . ROBOTIC ASSISTED TOTAL HYSTERECTOMY WITH SALPINGECTOMY Bilateral 03/12/2016   Converted to total abdominal hysterectom  . SALPINGOOPHORECTOMY Right    during appendectomy.    Family History  Problem Relation Age of Onset  . Hypertension Mother   . Hypertension Father   . Heart failure Father   . Cancer Father   . Breast cancer Neg Hx     SOCIAL HX: reviewed.    Current Outpatient Medications:  .  Adalimumab (HUMIRA PEN) 40 MG/0.8ML PNKT, Inject 40 mg into the skin every 14 (fourteen) days., Disp: , Rfl:  .  aspirin 325 MG tablet, Take 325 mg by mouth daily., Disp: , Rfl:  .  citalopram (CELEXA) 20 MG tablet,  TAKE 1.5 TABLETS BY MOUTH EVERY DAY, Disp: 135 tablet, Rfl: 1 .  ferrous sulfate 325 (65 FE) MG tablet, Take 325 mg by mouth every other day., Disp: , Rfl:  .  folic acid (FOLVITE) 1 MG tablet, Take 1 mg by mouth daily., Disp: , Rfl:  .  methotrexate 50 MG/2ML injection, Inject 1 mL as directed once a week. For 12 weeks, Disp: , Rfl:  .  nystatin (MYCOSTATIN) 100000 UNIT/ML suspension, Take 5 mLs (500,000 Units total) by mouth 4 (four) times daily., Disp: 60 mL, Rfl: 0 .  nystatin cream (MYCOSTATIN), Apply 1 application topically 2 (two) times daily., Disp: 30 g, Rfl:  0  EXAM:  GENERAL: alert, oriented, appears well and in no acute distress  HEENT: atraumatic, conjunttiva clear, no obvious abnormalities on inspection of external nose and ears  NECK: normal movements of the head and neck  LUNGS: on inspection no signs of respiratory distress, breathing rate appears normal, no obvious gross SOB, gasping or wheezing  CV: no obvious cyanosis  PSYCH/NEURO: pleasant and cooperative, no obvious depression or anxiety, speech and thought processing grossly intact  ASSESSMENT AND PLAN:  Discussed the following assessment and plan:  Stress Increased stress as outlined.  Discussed with her today.  Has good support.  On citalopram.  Discussed adjusting the dose.  Currently averaging 30mg  q day.  Feels this is working well for her.  Follow.  Hold on making any adjustments.     I discussed the assessment and treatment plan with the patient. The patient was provided an opportunity to ask questions and all were answered. The patient agreed with the plan and demonstrated an understanding of the instructions.   The patient was advised to call back or seek an in-person evaluation if the symptoms worsen or if the condition fails to improve as anticipated.   Einar Pheasant, MD

## 2020-01-02 ENCOUNTER — Encounter: Payer: Self-pay | Admitting: Internal Medicine

## 2020-01-02 NOTE — Assessment & Plan Note (Signed)
Increased stress as outlined.  Discussed with her today.  Has good support.  On citalopram.  Discussed adjusting the dose.  Currently averaging 30mg  q day.  Feels this is working well for her.  Follow.  Hold on making any adjustments.

## 2020-01-04 ENCOUNTER — Encounter: Payer: Self-pay | Admitting: Internal Medicine

## 2020-01-31 ENCOUNTER — Ambulatory Visit: Payer: Commercial Managed Care - PPO | Admitting: Obstetrics and Gynecology

## 2020-02-02 ENCOUNTER — Ambulatory Visit: Payer: Commercial Managed Care - PPO | Admitting: Obstetrics and Gynecology

## 2020-03-11 ENCOUNTER — Ambulatory Visit: Payer: Commercial Managed Care - PPO | Attending: Internal Medicine

## 2020-03-11 DIAGNOSIS — Z23 Encounter for immunization: Secondary | ICD-10-CM

## 2020-03-11 NOTE — Progress Notes (Signed)
   Covid-19 Vaccination Clinic  Name:  DERI DEGUIRE    MRN: [id number] DOB: 05-05-1966  03/11/2020  Ms. Parrent was observed post Covid-19 immunization for 15 minutes without incident. She was provided with Vaccine Information Sheet and instruction to access the V-Safe system.   Ms. Beamon was instructed to call 911 with any severe reactions post vaccine: Marland Kitchen Difficulty breathing  . Swelling of face and throat  . A fast heartbeat  . A bad rash all over body  . Dizziness and weakness   Immunizations Administered    Name Date Dose VIS Date Route   Pfizer COVID-19 Vaccine 03/11/2020 12:01 PM 0.3 mL 11/05/2019 Intramuscular   Manufacturer: [employer], [employer]   Lot: [id number]   [place]: [id number]

## 2020-03-29 ENCOUNTER — Ambulatory Visit: Payer: Self-pay | Admitting: Obstetrics and Gynecology

## 2020-04-05 ENCOUNTER — Encounter: Payer: Commercial Managed Care - PPO | Admitting: Internal Medicine

## 2020-04-11 ENCOUNTER — Ambulatory Visit: Payer: Commercial Managed Care - PPO | Attending: Internal Medicine

## 2020-04-11 DIAGNOSIS — Z23 Encounter for immunization: Secondary | ICD-10-CM

## 2020-04-11 NOTE — Progress Notes (Signed)
   Covid-19 Vaccination Clinic  Name:  Robin Kelly    MRN: [id number] DOB: 1966-06-20  04/11/2020  Robin Kelly was observed post Covid-19 immunization for 15 minutes without incident. She was provided with Vaccine Information Sheet and instruction to access the V-Safe system.   Robin Kelly was instructed to call 911 with any severe reactions post vaccine: Marland Kitchen Difficulty breathing  . Swelling of face and throat  . A fast heartbeat  . A bad rash all over body  . Dizziness and weakness   Immunizations Administered    Name Date Dose VIS Date Route   Pfizer COVID-19 Vaccine 04/11/2020  1:15 PM 0.3 mL 01/19/2019 Intramuscular   Manufacturer: [place]   Lot: [id number]   [place]: [id number]

## 2020-04-18 ENCOUNTER — Telehealth (INDEPENDENT_AMBULATORY_CARE_PROVIDER_SITE_OTHER): Payer: Commercial Managed Care - PPO | Admitting: Internal Medicine

## 2020-04-18 ENCOUNTER — Encounter: Payer: Self-pay | Admitting: Internal Medicine

## 2020-04-18 DIAGNOSIS — F439 Reaction to severe stress, unspecified: Secondary | ICD-10-CM | POA: Diagnosis not present

## 2020-04-18 DIAGNOSIS — D75839 Thrombocytosis, unspecified: Secondary | ICD-10-CM

## 2020-04-18 DIAGNOSIS — D473 Essential (hemorrhagic) thrombocythemia: Secondary | ICD-10-CM | POA: Diagnosis not present

## 2020-04-18 DIAGNOSIS — M1380 Other specified arthritis, unspecified site: Secondary | ICD-10-CM | POA: Diagnosis not present

## 2020-04-18 NOTE — Progress Notes (Signed)
Patient ID: Robin Kelly, female   DOB: [birth year], 54 y.o.   MRN: [id number]   Virtual Visit via video Note  This visit type was conducted due to national recommendations for restrictions regarding the COVID-19 pandemic (e.g. social distancing).  This format is felt to be most appropriate for this patient at this time.  All issues noted in this document were discussed and addressed.  No physical exam was performed (except for noted visual exam findings with Video Visits).   I connected with Marta Antu by a video enabled telemedicine application and verified that I am speaking with the correct person using two identifiers. Location patient: home Location provider: work  Persons participating in the virtual visit: patient, provider  The limitations, risks, security and privacy concerns of performing an evaluation and management service by telephone and the availability of in person appointments have been discussed.  It has also been discussed with the patient that there may be a patient responsible charge related to this service. The patient has expressed understanding and has agreed to proceed.   Reason for visit: scheduled follow up.    HPI: Scheduled to follow up regarding increased stress.  Sees Dr Meda Coffee for f/u of seronegative , CCP negative non erosive rheumatoid arthritis.  Continue humira and MTX.  Joints stable.  On citalopram.  When to counseling.  Feels she is doing better.  Feels current dosing or citalopram is working well.  Tries to stay active.  Went to [employer] two weeks ago.  No chest pain or sob reported.  No abdominal pain or bowel change reported.  Occasionally will have nights she does not sleep as well.  Still trying to cope with her husband's death.  Has been journaling.     ROS: See pertinent positives and negatives per HPI.  Past Medical History:  Diagnosis Date  . Allergy   . Anemia   . Anxiety    due to heavy bleeding and SOB  . Arthritis   . Cancer ([place]) 2004   melanoma  . Heavy menstrual bleeding   . Rheumatoid arthritis ([place])   . Seasonal allergies   . Shortness of breath dyspnea    "due to anemia"    Past Surgical History:  Procedure Laterality Date  . ABDOMINAL HYSTERECTOMY    . APPENDECTOMY  2001   right tube and ovary removed also.  Marland Kitchen BREAST BIOPSY Right 07/28/2019   [place] bx, path pending  . CESAREAN SECTION     x2  . CHOLECYSTECTOMY    . CYSTOSCOPY N/A 03/12/2016   Procedure: CYSTOSCOPY;  Surgeon: Will Bonnet, MD;  Location: [facility];  Service: Gynecology;  Laterality: N/A;  . DILATATION & CURETTAGE/HYSTEROSCOPY WITH MYOSURE N/A 02/06/2016   Procedure: DILATATION & CURETTAGE HYSTEROSCOPY  polypectomy;  Surgeon: Will Bonnet, MD;  Location: [facility];  Service: Gynecology;  Laterality: N/A;  . POLYPECTOMY N/A 02/06/2016   Procedure: POLYPECTOMY;  Surgeon: Will Bonnet, MD;  Location: [facility];  Service: Gynecology;  Laterality: N/A;  . ROBOTIC ASSISTED TOTAL HYSTERECTOMY WITH SALPINGECTOMY Bilateral 03/12/2016   Converted to total abdominal hysterectom  . SALPINGOOPHORECTOMY Right    during appendectomy.    Family History  Problem Relation Age of Onset  . Hypertension Mother   . Hypertension Father   . Heart failure Father   . Cancer Father   . Breast cancer Neg Hx     SOCIAL HX: reviewed.    Current Outpatient Medications:  .  Adalimumab (HUMIRA PEN) 40  MG/0.8ML PNKT, Inject 40 mg into the skin every 14 (fourteen) days., Disp: , Rfl:  .  aspirin 325 MG tablet, Take 325 mg by mouth daily., Disp: , Rfl:  .  citalopram (CELEXA) 20 MG tablet, TAKE 1.5 TABLETS BY MOUTH EVERY DAY, Disp: 135 tablet, Rfl: 1 .  ferrous sulfate 325 (65 FE) MG tablet, Take 325 mg by mouth every other day., Disp: , Rfl:  .  folic acid (FOLVITE) 1 MG tablet, Take 1 mg by mouth daily., Disp: , Rfl:  .  INSULIN SYRINGE 1CC/29G (EXEL COMFORT POINT INSULIN SYR) 29G X 1/2" 1 ML MISC, Use as directed For Methotrexate injection, weekly, Disp: ,  Rfl:  .  methotrexate 50 MG/2ML injection, Inject 1 mL as directed once a week. For 12 weeks, Disp: , Rfl:   EXAM:  GENERAL: alert, oriented, appears well and in no acute distress  HEENT: atraumatic, conjunttiva clear, no obvious abnormalities on inspection of external nose and ears  NECK: normal movements of the head and neck  LUNGS: on inspection no signs of respiratory distress, breathing rate appears normal, no obvious gross SOB, gasping or wheezing  CV: no obvious cyanosis  PSYCH/NEURO: pleasant and cooperative, no obvious depression or anxiety, speech and thought processing grossly intact  ASSESSMENT AND PLAN:  Discussed the following assessment and plan:  Thrombocytosis (HCC) Last platelet count wnl.  Follow.    Stress Increased stress as outlined.  Has good support.  Was seeing a Social worker.  Doing better.  Continue citalopram.  Follow.    Seronegative arthritis Continue f/u with rheumatology.  Continues on humira and MTX.      I discussed the assessment and treatment plan with the patient. The patient was provided an opportunity to ask questions and all were answered. The patient agreed with the plan and demonstrated an understanding of the instructions.   The patient was advised to call back or seek an in-person evaluation if the symptoms worsen or if the condition fails to improve as anticipated.    Einar Pheasant, MD

## 2020-04-23 ENCOUNTER — Encounter: Payer: Self-pay | Admitting: Internal Medicine

## 2020-04-23 NOTE — Assessment & Plan Note (Signed)
Continue f/u with rheumatology.  Continues on humira and MTX.

## 2020-04-23 NOTE — Assessment & Plan Note (Signed)
Last platelet count wnl.  Follow.  

## 2020-04-23 NOTE — Assessment & Plan Note (Signed)
Increased stress as outlined.  Has good support.  Was seeing a Social worker.  Doing better.  Continue citalopram.  Follow.

## 2020-05-01 ENCOUNTER — Ambulatory Visit: Payer: Self-pay | Admitting: Obstetrics and Gynecology

## 2020-06-02 ENCOUNTER — Other Ambulatory Visit: Payer: Self-pay | Admitting: Internal Medicine

## 2020-06-14 ENCOUNTER — Other Ambulatory Visit: Payer: Self-pay | Admitting: Internal Medicine

## 2020-06-14 DIAGNOSIS — Z1231 Encounter for screening mammogram for malignant neoplasm of breast: Secondary | ICD-10-CM

## 2020-06-27 ENCOUNTER — Ambulatory Visit: Payer: Self-pay | Admitting: Obstetrics and Gynecology

## 2020-07-10 ENCOUNTER — Other Ambulatory Visit: Payer: Self-pay

## 2020-07-10 ENCOUNTER — Ambulatory Visit
Admission: RE | Admit: 2020-07-10 | Discharge: 2020-07-10 | Disposition: A | Discharge status: Home / Self Care | Payer: Commercial Managed Care - PPO | Source: Ambulatory Visit | Attending: Internal Medicine | Admitting: Internal Medicine

## 2020-07-10 DIAGNOSIS — Z1231 Encounter for screening mammogram for malignant neoplasm of breast: Secondary | ICD-10-CM | POA: Diagnosis present

## 2020-07-25 ENCOUNTER — Ambulatory Visit: Payer: Self-pay | Admitting: Obstetrics and Gynecology

## 2020-08-09 ENCOUNTER — Telehealth: Payer: Commercial Managed Care - PPO | Admitting: Internal Medicine

## 2020-08-09 ENCOUNTER — Telehealth: Payer: Self-pay | Admitting: Internal Medicine

## 2020-08-09 NOTE — Telephone Encounter (Signed)
Pts mom car broke down and she has to go pick her up so she had to cancel this mornings appointment. She said she is doing good with anxiety medication and she rescheduled appt for 09/13/20 @ 9:30.

## 2020-08-21 ENCOUNTER — Ambulatory Visit: Payer: Self-pay | Admitting: Obstetrics and Gynecology

## 2020-09-13 ENCOUNTER — Other Ambulatory Visit: Payer: Self-pay

## 2020-09-13 ENCOUNTER — Telehealth (INDEPENDENT_AMBULATORY_CARE_PROVIDER_SITE_OTHER): Payer: Commercial Managed Care - PPO | Admitting: Internal Medicine

## 2020-09-13 VITALS — Ht 62.0 in | Wt 185.0 lb

## 2020-09-13 DIAGNOSIS — Z1211 Encounter for screening for malignant neoplasm of colon: Secondary | ICD-10-CM

## 2020-09-13 DIAGNOSIS — D649 Anemia, unspecified: Secondary | ICD-10-CM | POA: Diagnosis not present

## 2020-09-13 DIAGNOSIS — D75839 Thrombocytosis, unspecified: Secondary | ICD-10-CM

## 2020-09-13 DIAGNOSIS — M1990 Unspecified osteoarthritis, unspecified site: Secondary | ICD-10-CM

## 2020-09-13 DIAGNOSIS — F439 Reaction to severe stress, unspecified: Secondary | ICD-10-CM

## 2020-09-13 DIAGNOSIS — Z13220 Encounter for screening for lipoid disorders: Secondary | ICD-10-CM

## 2020-09-13 DIAGNOSIS — Z124 Encounter for screening for malignant neoplasm of cervix: Secondary | ICD-10-CM

## 2020-09-13 MED ORDER — CITALOPRAM HYDROBROMIDE 20 MG PO TABS: ORAL_TABLET | ORAL | 1 refills | Status: DC

## 2020-09-13 NOTE — Progress Notes (Signed)
Patient ID: Robin Kelly, female   DOB: Jul 18, 1966, 54 y.o.   MRN: [id number]   Virtual Visit via video Note  This visit type was conducted due to national recommendations for restrictions regarding the COVID-19 pandemic (e.g. social distancing).  This format is felt to be most appropriate for this patient at this time.  All issues noted in this document were discussed and addressed.  No physical exam was performed (except for noted visual exam findings with Video Visits).   I connected with Robin Kelly by a video enabled telemedicine application and verified that I am speaking with the correct person using two identifiers. Location patient: home Location provider: work  Persons participating in the virtual visit: patient, provider  The limitations, risks, security and privacy concerns of performing an evaluation and management service by video and the availability of in person appointments have been discussed. It has also been discussed with the patient that there may be a patient responsible charge related to this service. The patient expressed understanding and agreed to proceed.   Reason for visit: scheduled follow up.   HPI: F/u regarding increased stress and inflammatory arthritis.  Still trying to cope with her husband's death.  Overall she is doing well.  On citalopram.  Tolerating and feels this is working well for her.  Taking 1 1/2 - 2 tablets per day.  Would like to remain on this dose.  Tries to stay active.  No chest pain or sob reported.  No abdominal pain.  Bowels moving.  Joints doing well.  Tolerating the humira and MTX.  Discussed flu vaccine and covid booster. Discussed colon cancer screening.   ROS: See pertinent positives and negatives per HPI.  Past Medical History:  Diagnosis Date  . Allergy   . Anemia   . Anxiety    due to heavy bleeding and SOB  . Arthritis   . Cancer ([place]) 2004   melanoma  . Heavy menstrual bleeding   . Rheumatoid arthritis ([place])   .  Seasonal allergies   . Shortness of breath dyspnea    "due to anemia"    Past Surgical History:  Procedure Laterality Date  . ABDOMINAL HYSTERECTOMY    . APPENDECTOMY  2001   right tube and ovary removed also.  Marland Kitchen BREAST BIOPSY Right 07/28/2019   [place] bx, - BENIGN MAMMARY PARENCHYMA WITH FIBROCYSTIC   . CESAREAN SECTION     x2  . CHOLECYSTECTOMY    . CYSTOSCOPY N/A 03/12/2016   Procedure: CYSTOSCOPY;  Surgeon: Will Bonnet, MD;  Location: [facility];  Service: Gynecology;  Laterality: N/A;  . DILATATION & CURETTAGE/HYSTEROSCOPY WITH MYOSURE N/A 02/06/2016   Procedure: DILATATION & CURETTAGE HYSTEROSCOPY  polypectomy;  Surgeon: Will Bonnet, MD;  Location: [facility];  Service: Gynecology;  Laterality: N/A;  . POLYPECTOMY N/A 02/06/2016   Procedure: POLYPECTOMY;  Surgeon: Will Bonnet, MD;  Location: [facility];  Service: Gynecology;  Laterality: N/A;  . ROBOTIC ASSISTED TOTAL HYSTERECTOMY WITH SALPINGECTOMY Bilateral 03/12/2016   Converted to total abdominal hysterectom  . SALPINGOOPHORECTOMY Right    during appendectomy.    Family History  Problem Relation Age of Onset  . Hypertension Mother   . Hypertension Father   . Heart failure Father   . Cancer Father   . Breast cancer Neg Hx     SOCIAL HX: reviewed.    Current Outpatient Medications:  .  Adalimumab (HUMIRA PEN) 40 MG/0.8ML PNKT, Inject 40 mg into the skin every 14 (fourteen)  days., Disp: , Rfl:  .  aspirin 325 MG tablet, Take 325 mg by mouth daily., Disp: , Rfl:  .  citalopram (CELEXA) 20 MG tablet, TAKE 1 AND 1/2 - two TABLETS BY MOUTH EVERY DAY, Disp: 180 tablet, Rfl: 1 .  ferrous sulfate 325 (65 FE) MG tablet, Take 325 mg by mouth every other day., Disp: , Rfl:  .  folic acid (FOLVITE) 1 MG tablet, Take 1 mg by mouth daily., Disp: , Rfl:  .  INSULIN SYRINGE 1CC/29G (EXEL COMFORT POINT INSULIN SYR) 29G X 1/2" 1 ML MISC, Use as directed For Methotrexate injection, weekly, Disp: , Rfl:  .  methotrexate 50  MG/2ML injection, Inject 1 mL as directed once a week. For 12 weeks, Disp: , Rfl:   EXAM:  GENERAL: alert, oriented, appears well and in no acute distress  HEENT: atraumatic, conjunttiva clear, no obvious abnormalities on inspection of external nose and ears  NECK: normal movements of the head and neck  LUNGS: on inspection no signs of respiratory distress, breathing rate appears normal, no obvious gross SOB, gasping or wheezing  CV: no obvious cyanosis  PSYCH/NEURO: pleasant and cooperative, no obvious depression or anxiety, speech and thought processing grossly intact  ASSESSMENT AND PLAN:  Discussed the following assessment and plan:  Problem List Items Addressed This Visit    Thrombocytosis    Follow cbc.       Stress    Increased stress as outlined.  Doing well on her current dose of citalopram.  Follow.        Inflammatory arthritis    Being followed by rheumatology - Dr Posey Pronto. On humira and MTX.  Stable.       Relevant Orders   CBC with Differential/Platelet   Hepatic function panel   TSH   Basic metabolic panel   Colon cancer screening    Discussed colon cancer screening.  Discussed colonoscopy.  She prefers cologuard.  Order placed.       Relevant Orders   Cologuard   Cervical cancer screening    States is followed by Dr Glennon Mac at [place].       Anemia    Follow cbc.       Relevant Orders   CBC with Differential/Platelet    Other Visit Diagnoses    Screening cholesterol level    -  Primary   Relevant Orders   Lipid panel       I discussed the assessment and treatment plan with the patient. The patient was provided an opportunity to ask questions and all were answered. The patient agreed with the plan and demonstrated an understanding of the instructions.   The patient was advised to call back or seek an in-person evaluation if the symptoms worsen or if the condition fails to improve as anticipated.   Einar Pheasant, MD

## 2020-09-24 ENCOUNTER — Telehealth: Payer: Self-pay | Admitting: Internal Medicine

## 2020-09-24 ENCOUNTER — Encounter: Payer: Self-pay | Admitting: Internal Medicine

## 2020-09-24 NOTE — Assessment & Plan Note (Signed)
Discussed colon cancer screening.  Discussed colonoscopy.  She prefers cologuard.  Order placed.

## 2020-09-24 NOTE — Assessment & Plan Note (Signed)
Follow cbc.  

## 2020-09-24 NOTE — Telephone Encounter (Signed)
Order placed for cologuard. Do we need to do anything else to get this sent to her house?

## 2020-09-24 NOTE — Assessment & Plan Note (Signed)
Increased stress as outlined.  Doing well on her current dose of citalopram.  Follow.

## 2020-09-24 NOTE — Assessment & Plan Note (Signed)
States is followed by Dr Glennon Mac at [place].

## 2020-09-24 NOTE — Assessment & Plan Note (Signed)
Being followed by rheumatology - Dr Posey Pronto. On humira and MTX.  Stable.

## 2020-09-25 NOTE — Telephone Encounter (Signed)
Cologuard submitted through portal 

## 2020-09-28 ENCOUNTER — Other Ambulatory Visit: Payer: Self-pay | Admitting: Internal Medicine

## 2020-10-05 ENCOUNTER — Other Ambulatory Visit: Payer: Commercial Managed Care - PPO

## 2020-10-24 ENCOUNTER — Other Ambulatory Visit: Payer: Commercial Managed Care - PPO

## 2020-11-14 ENCOUNTER — Encounter: Payer: Self-pay | Admitting: Internal Medicine

## 2020-11-14 LAB — COLOGUARD: Cologuard: NEGATIVE

## 2020-11-22 LAB — HM COLONOSCOPY

## 2020-12-01 ENCOUNTER — Telehealth: Payer: Self-pay | Admitting: Internal Medicine

## 2020-12-01 NOTE — Telephone Encounter (Signed)
Patient would like to know her Cologuard test. Please call her.

## 2020-12-04 NOTE — Telephone Encounter (Signed)
Unable to reach pt: My chart message sent

## 2020-12-28 ENCOUNTER — Encounter: Payer: Self-pay | Admitting: *Deleted

## 2021-01-15 ENCOUNTER — Other Ambulatory Visit: Payer: BLUE CROSS/BLUE SHIELD

## 2021-01-17 ENCOUNTER — Ambulatory Visit: Payer: Commercial Managed Care - PPO | Admitting: Internal Medicine

## 2021-02-16 IMAGING — MG DIGITAL SCREENING BILAT W/ TOMO W/ CAD
8 series · 8 of 24 positions shown · non-contrast
Comparison: Previous exam(s).

CLINICAL DATA: Screening.

EXAM:
DIGITAL SCREENING BILATERAL MAMMOGRAM WITH TOMO AND CAD

[L MLO synth-2D]
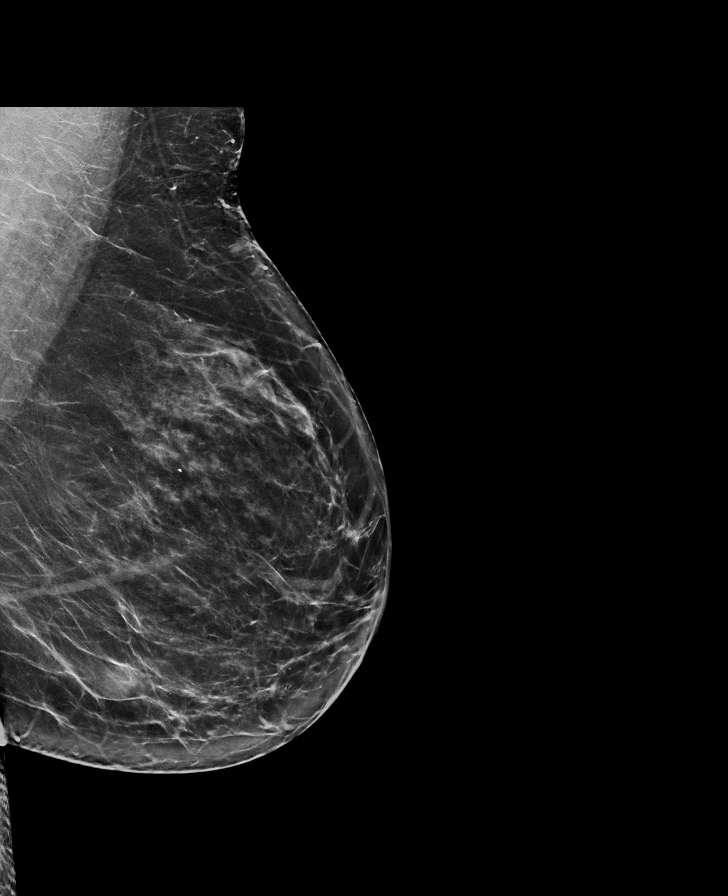

[R MLO synth-2D]
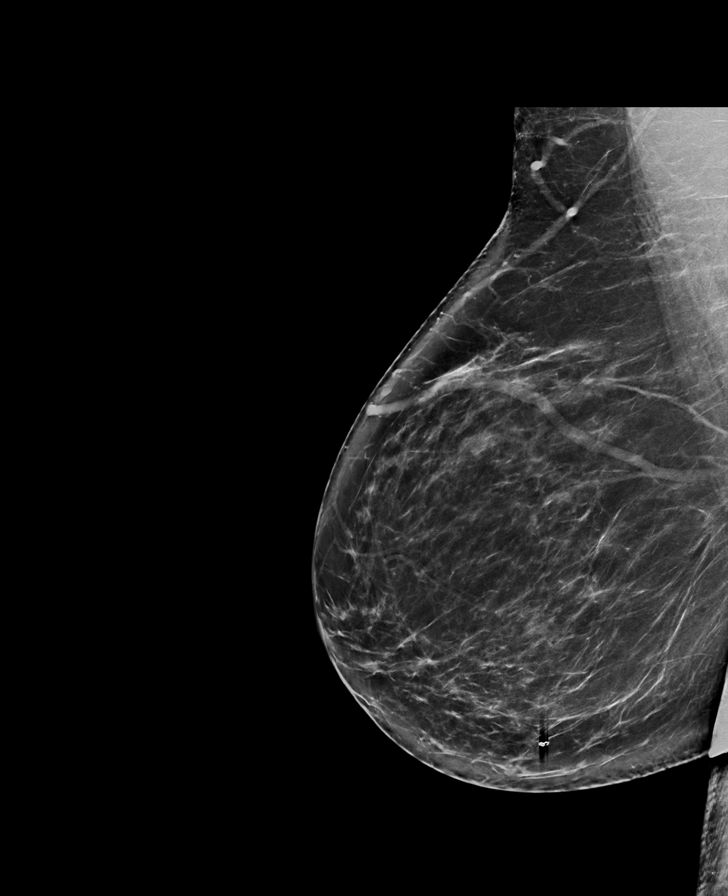

[L CC synth-2D]
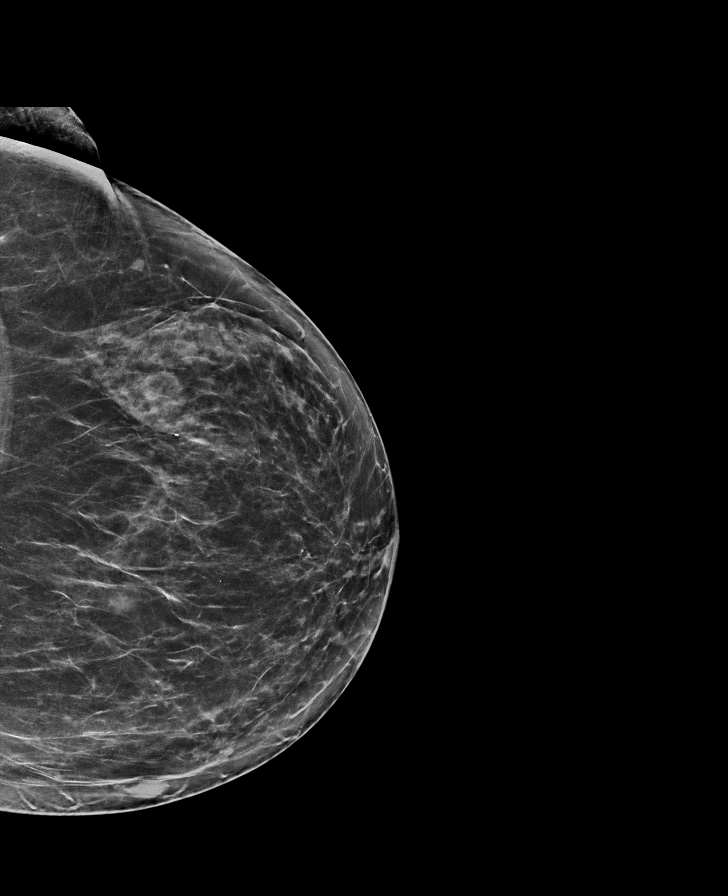

[R CC synth-2D]
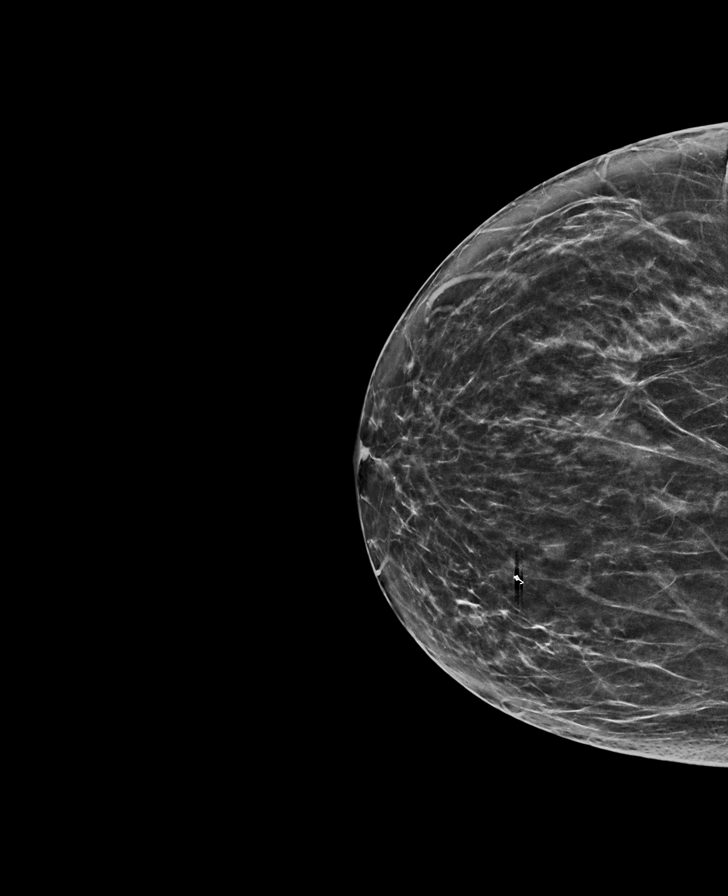

[R MLO tomo · tomo slice 41/81.0]
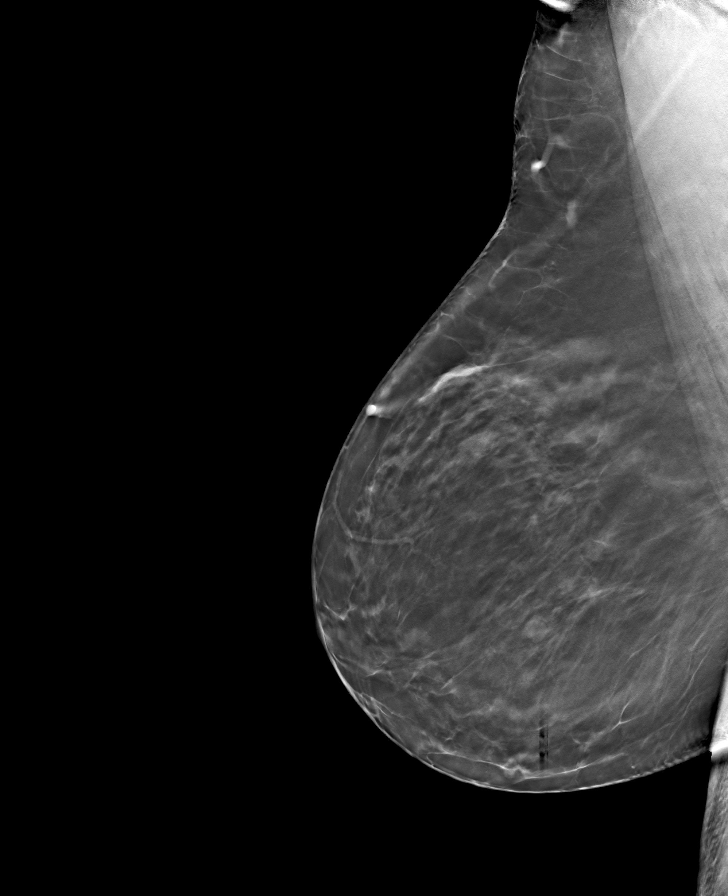

[R CC tomo · tomo slice 33/65.0]
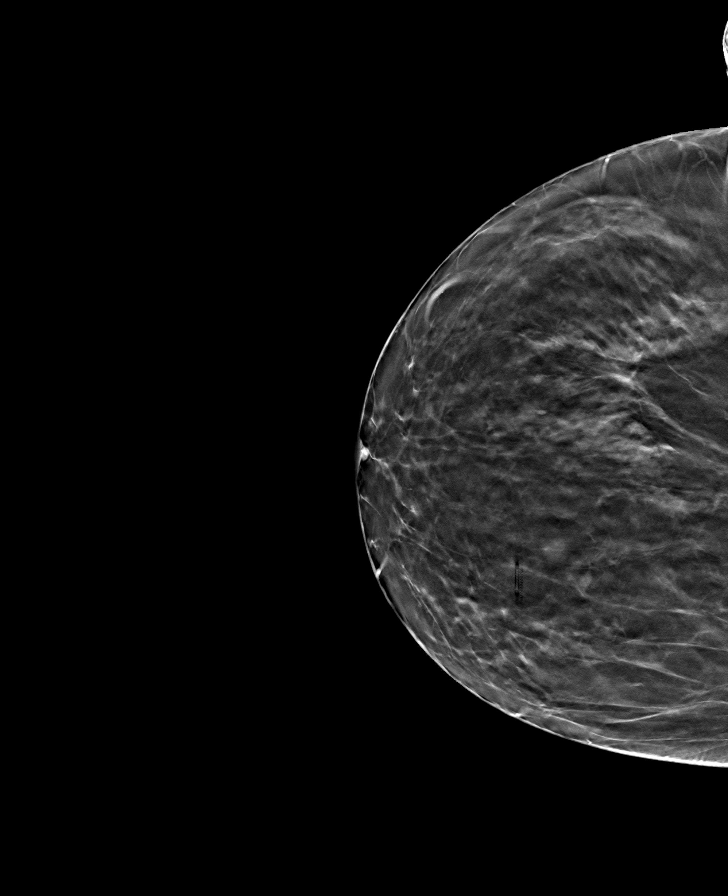

[L MLO tomo · tomo slice 41/81.0]
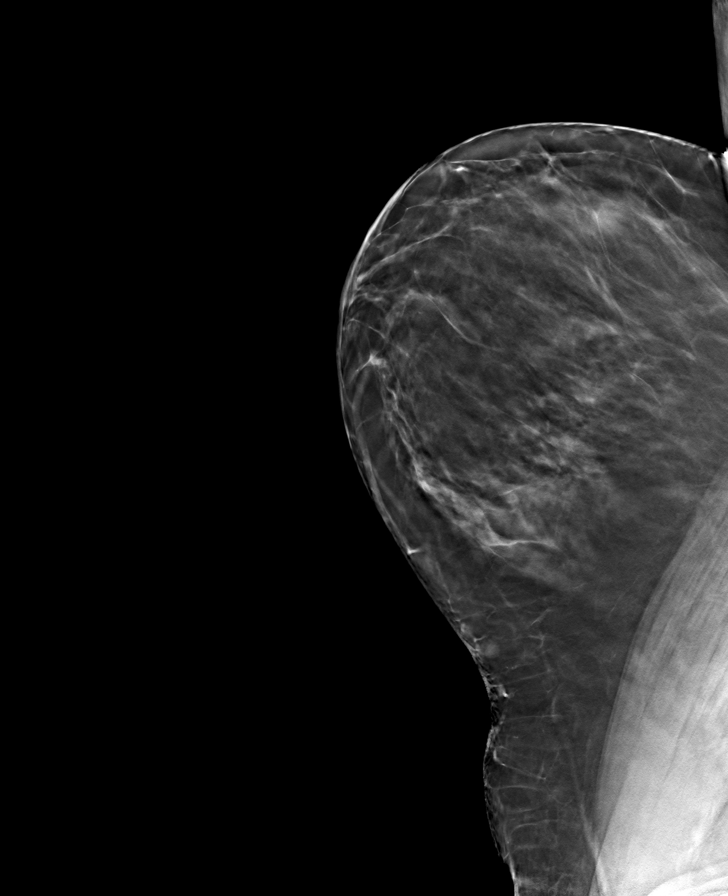

[L CC tomo · tomo slice 36/71.0]
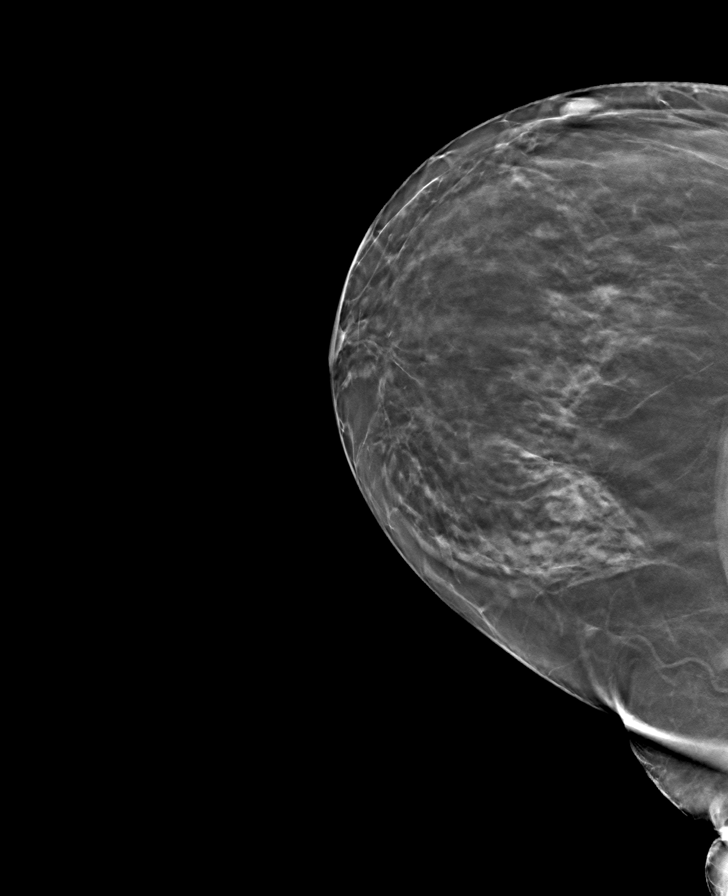

[8 of 24 positions shown; findings below may reference images not displayed]

ACR Breast Density Category c: The breast tissue is heterogeneously
dense, which may obscure small masses.
FINDINGS: There are no findings suspicious for malignancy. Images were
processed with CAD.
IMPRESSION: No mammographic evidence of malignancy. A result letter of this
screening mammogram will be mailed directly to the patient.

RECOMMENDATION:
Screening mammogram in one year. (Code:FT-U-LHB)

BI-RADS CATEGORY  1: Negative.

## 2021-03-26 ENCOUNTER — Ambulatory Visit
Admission: RE | Admit: 2021-03-26 | Discharge: 2021-03-26 | Disposition: A | Discharge status: Home / Self Care | Payer: BLUE CROSS/BLUE SHIELD | Source: Ambulatory Visit | Attending: Emergency Medicine | Admitting: Emergency Medicine

## 2021-03-26 ENCOUNTER — Other Ambulatory Visit: Payer: Self-pay

## 2021-03-26 VITALS — BP 135/79 | HR 73 | Temp 98.3°F | Resp 20

## 2021-03-26 DIAGNOSIS — J0140 Acute pansinusitis, unspecified: Secondary | ICD-10-CM

## 2021-03-26 MED ORDER — BENZONATATE 100 MG PO CAPS: 200.0000 mg | ORAL_CAPSULE | Freq: Three times a day (TID) | ORAL | 0 refills | Status: DC

## 2021-03-26 MED ORDER — LEVOFLOXACIN 500 MG PO TABS: 500.0000 mg | ORAL_TABLET | Freq: Every day | ORAL | 0 refills | Status: AC

## 2021-03-26 MED ORDER — PROMETHAZINE-DM 6.25-15 MG/5ML PO SYRP: 5.0000 mL | ORAL_SOLUTION | Freq: Four times a day (QID) | ORAL | 0 refills | Status: DC | PRN

## 2021-03-26 MED ORDER — PREDNISONE 10 MG (21) PO TBPK: ORAL_TABLET | ORAL | 0 refills | Status: DC

## 2021-03-26 NOTE — ED Provider Notes (Signed)
[facility]    CSN: [id number] Arrival date & time: 03/26/21  1200      History   Chief Complaint Chief Complaint  Patient presents with  . Facial Pain  . Nasal Congestion    HPI Robin Kelly is a 55 y.o. female.   HPI   55 year old female here for evaluation of sinus complaints.  Patient reports that she has been experiencing runny nose and nasal congestion with a green nasal discharge, sinus pressure, productive cough for green sputum, shortness of breath, scratchy throat, and itching ears for the past 2 weeks.  Patient denies fever, wheezing, or GI complaints.  Past Medical History:  Diagnosis Date  . Allergy   . Anemia   . Anxiety    due to heavy bleeding and SOB  . Arthritis   . Cancer ([place]) 2004   melanoma  . Heavy menstrual bleeding   . Rheumatoid arthritis ([place])   . Seasonal allergies   . Shortness of breath dyspnea    "due to anemia"    Patient Active Problem List   Diagnosis Date Noted  . Rash 08/21/2019  . Colon cancer screening 08/21/2019  . Cervical cancer screening 08/21/2019  . Stress 04/05/2019  . Leukocytosis 12/13/2018  . Inflammatory arthritis 12/13/2018  . Elevated blood pressure reading 12/13/2018  . Thrombocytosis 10/13/2017  . Seronegative arthritis 05/14/2017  . Health care maintenance 04/20/2017  . Left knee pain 03/09/2017  . Pain of right heel 09/15/2016  . Anemia 03/14/2016  . Status post abdominal hysterectomy 03/12/2016  . Status post hysterectomy 03/12/2016  . Menorrhagia with irregular cycle 02/06/2016  . Endometrial polyp 02/06/2016  . Fibroids 02/06/2016    Past Surgical History:  Procedure Laterality Date  . ABDOMINAL HYSTERECTOMY    . APPENDECTOMY  2001   right tube and ovary removed also.  Marland Kitchen BREAST BIOPSY Right 07/28/2019   [place] bx, - BENIGN MAMMARY PARENCHYMA WITH FIBROCYSTIC   . CESAREAN SECTION     x2  . CHOLECYSTECTOMY    . CYSTOSCOPY N/A 03/12/2016   Procedure: CYSTOSCOPY;  Surgeon: Will Bonnet, MD;  Location: [facility];  Service: Gynecology;  Laterality: N/A;  . DILATATION & CURETTAGE/HYSTEROSCOPY WITH MYOSURE N/A 02/06/2016   Procedure: DILATATION & CURETTAGE HYSTEROSCOPY  polypectomy;  Surgeon: Will Bonnet, MD;  Location: [facility];  Service: Gynecology;  Laterality: N/A;  . POLYPECTOMY N/A 02/06/2016   Procedure: POLYPECTOMY;  Surgeon: Will Bonnet, MD;  Location: [facility];  Service: Gynecology;  Laterality: N/A;  . rheumatoid arthritis    . ROBOTIC ASSISTED TOTAL HYSTERECTOMY WITH SALPINGECTOMY Bilateral 03/12/2016   Converted to total abdominal hysterectom  . SALPINGOOPHORECTOMY Right    during appendectomy.    OB History   No obstetric history on file.      Home Medications    Prior to Admission medications   Medication Sig Start Date End Date Taking? Authorizing Provider  Adalimumab 40 MG/0.8ML PNKT Inject 40 mg into the skin every 14 (fourteen) days.   Yes [provider]  aspirin 325 MG tablet Take 325 mg by mouth daily.   Yes [provider]  benzonatate (TESSALON) 100 MG capsule Take 2 capsules (200 mg total) by mouth every 8 (eight) hours. 03/26/21  Yes Margarette Canada, NP  citalopram (CELEXA) 20 MG tablet TAKE 1.5 TABLETS BY MOUTH EVERY DAY 09/28/20  Yes Einar Pheasant, MD  ferrous sulfate 325 (65 FE) MG tablet Take 325 mg by mouth every other day.  Yes [provider]  levofloxacin (LEVAQUIN) 500 MG tablet Take 1 tablet (500 mg total) by mouth daily for 10 days. 03/26/21 04/05/21 Yes Margarette Canada, NP  methotrexate 50 MG/2ML injection Inject 1 mL as directed once a week. For 12 weeks 03/24/18  Yes [provider]  predniSONE (STERAPRED UNI-PAK 21 TAB) 10 MG (21) TBPK tablet Take 6 tablets on day 1, 5 tablets day 2, 4 tablets day 3, 3 tablets day 4, 2 tablets day 5, 1 tablet day 6 03/26/21  Yes Margarette Canada, NP  promethazine-dextromethorphan (PROMETHAZINE-DM) 6.25-15 MG/5ML syrup Take 5 mLs by mouth 4 (four) times daily as  needed. 03/26/21  Yes Margarette Canada, NP  folic acid (FOLVITE) 1 MG tablet Take 1 mg by mouth daily.    [provider]    Family History Family History  Problem Relation Age of Onset  . Hypertension Mother   . Hypertension Father   . Heart failure Father   . Cancer Father   . Breast cancer Neg Hx     Social History Social History   Tobacco Use  . Smoking status: Never Smoker  . Smokeless tobacco: Never Used  Vaping Use  . Vaping Use: Never used  Substance Use Topics  . Alcohol use: No  . Drug use: No     Allergies   Penicillins and Oxycodone   Review of Systems Review of Systems  Constitutional: Negative for activity change, appetite change and fever.  HENT: Positive for ear pain, postnasal drip, rhinorrhea, sinus pressure, sinus pain and sore throat.   Respiratory: Positive for cough and shortness of breath. Negative for wheezing.   Gastrointestinal: Negative for diarrhea, nausea and vomiting.  Skin: Negative for rash.  Hematological: Negative.   Psychiatric/Behavioral: Negative.      Physical Exam Triage Vital Signs ED Triage Vitals  Enc Vitals Group     BP 03/26/21 1230 135/79     Pulse Rate 03/26/21 1230 73     Resp 03/26/21 1230 20     Temp 03/26/21 1230 98.3 F (36.8 C)     Temp Source 03/26/21 1230 Oral     SpO2 03/26/21 1230 99 %     Weight --      Height --      Head Circumference --      Peak Flow --      Pain Score 03/26/21 1222 2     Pain Loc --      Pain Edu? --      Excl. in [place]? --    No data found.  Updated Vital Signs BP 135/79 (BP Location: Right Arm)   Pulse 73   Temp 98.3 F (36.8 C) (Oral)   Resp 20   LMP 10/23/2015 Comment: bleeding since 10/23/15  SpO2 99%   Visual Acuity Right Eye Distance:   Left Eye Distance:   Bilateral Distance:    Right Eye Near:   Left Eye Near:    Bilateral Near:     Physical Exam Vitals and nursing note reviewed.  Constitutional:      General: She is not in acute distress.     Appearance: Normal appearance. She is not ill-appearing.  HENT:     Head: Normocephalic and atraumatic.     Right Ear: Tympanic membrane, ear canal and external ear normal. There is no impacted cerumen.     Left Ear: Tympanic membrane, ear canal and external ear normal. There is no impacted cerumen.     Nose: Congestion  present.     Mouth/Throat:     Mouth: Mucous membranes are moist.     Pharynx: Oropharynx is clear. Posterior oropharyngeal erythema present.  Cardiovascular:     Rate and Rhythm: Normal rate and regular rhythm.     Pulses: Normal pulses.     Heart sounds: Normal heart sounds. No murmur heard. No gallop.   Pulmonary:     Effort: Pulmonary effort is normal.     Breath sounds: Normal breath sounds. No wheezing, rhonchi or rales.  Musculoskeletal:     Cervical back: Normal range of motion and neck supple.  Lymphadenopathy:     Cervical: No cervical adenopathy.  Skin:    General: Skin is warm and dry.     Capillary Refill: Capillary refill takes less than 2 seconds.     Findings: No erythema or rash.  Neurological:     General: No focal deficit present.     Mental Status: She is alert and oriented to person, place, and time.  Psychiatric:        Mood and Affect: Mood normal.        Behavior: Behavior normal.        Thought Content: Thought content normal.        Judgment: Judgment normal.      UC Treatments / Results  Labs (all labs ordered are listed, but only abnormal results are displayed) Labs Reviewed - No data to display  EKG   Radiology No results found.  Procedures Procedures (including critical care time)  Medications Ordered in UC Medications - No data to display  Initial Impression / Assessment and Plan / UC Course  I have reviewed the triage vital signs and the nursing notes.  Pertinent labs & imaging results that were available during my care of the patient were reviewed by me and considered in my medical decision making (see chart for  details).   Patient is a very pleasant, nontoxic-appearing 55 year old female here for evaluation of sinus complaints have been going on for the past 2 weeks and include a runny nose with nasal congestion and nasal discharge of green mucus, sinus pressure, productive cough for green sputum, mild shortness of breath after coughing jag, scratchy throat, and itchy ears.  Physical exam reveals bilateral pearly gray tympanic membranes with a normal light reflex and clear external auditory canals.  Nasal mucosa is erythematous and edematous and patient has tenderness to percussion of frontal and maxillary sinuses bilaterally.  Posterior oropharynx has erythema and yellow postnasal drip.  No cervical lymphadenopathy on exam.  Lungs clear to auscultation all fields.  Patient's physical exam is consistent with sinusitis and will treat with Levaquin 500 mg daily x10 days as patient is allergic to penicillins, will also use prednisone, Tessalon Perles, and Promethazine DM cough syrup.   Final Clinical Impressions(s) / UC Diagnoses   Final diagnoses:  Acute non-recurrent pansinusitis     Discharge Instructions     The Levaquin daily with food for 10 days for treatment of your sinusitis.  Perform sinus irrigation 2-3 times a day with a [employer]Med sinus rinse kit and distilled water.  Do not use tap water.  You can use plain over-the-counter Mucinex every 6 hours to break up the stickiness of the mucus so your body can clear it.  Increase your oral fluid intake to thin out your mucus so that is also able for your body to clear more easily.  Take an over-the-counter probiotic, such as Culturelle-align-activia, 1 hour after  each dose of antibiotic to prevent diarrhea.  Start the prednisone tomorrow morning and take it according to the package instructions.  Use the Tessalon Perles during the day as needed for cough and the Promethazine DM cough syrup at bedtime.  The cough syrup will make you drowsy.  If  you develop any new or worsening symptoms return for reevaluation or see your primary care provider.     ED Prescriptions    Medication Sig Dispense Auth. Provider   levofloxacin (LEVAQUIN) 500 MG tablet Take 1 tablet (500 mg total) by mouth daily for 10 days. 10 tablet Margarette Canada, NP   predniSONE (STERAPRED UNI-PAK 21 TAB) 10 MG (21) TBPK tablet Take 6 tablets on day 1, 5 tablets day 2, 4 tablets day 3, 3 tablets day 4, 2 tablets day 5, 1 tablet day 6 21 tablet Margarette Canada, NP   benzonatate (TESSALON) 100 MG capsule Take 2 capsules (200 mg total) by mouth every 8 (eight) hours. 21 capsule Margarette Canada, NP   promethazine-dextromethorphan (PROMETHAZINE-DM) 6.25-15 MG/5ML syrup Take 5 mLs by mouth 4 (four) times daily as needed. 118 mL Margarette Canada, NP     PDMP not reviewed this encounter.   Margarette Canada, NP 03/26/21 [phone]

## 2021-03-26 NOTE — ED Triage Notes (Addendum)
Pt c/o of nasal congestion/running, sinus pressure and cough x 2 weeks. Denies fever. Has been treating with OTC allergy meds with no relief.  Pt reports taking home Covid test pta and it was negative. She declines Covid test today.

## 2021-03-26 NOTE — Discharge Instructions (Addendum)
The Levaquin daily with food for 10 days for treatment of your sinusitis.  Perform sinus irrigation 2-3 times a day with a NeilMed sinus rinse kit and distilled water.  Do not use tap water.  You can use plain over-the-counter Mucinex every 6 hours to break up the stickiness of the mucus so your body can clear it.  Increase your oral fluid intake to thin out your mucus so that is also able for your body to clear more easily.  Take an over-the-counter probiotic, such as Culturelle-align-activia, 1 hour after each dose of antibiotic to prevent diarrhea.  Start the prednisone tomorrow morning and take it according to the package instructions.  Use the Tessalon Perles during the day as needed for cough and the Promethazine DM cough syrup at bedtime.  The cough syrup will make you drowsy.  If you develop any new or worsening symptoms return for reevaluation or see your primary care provider.

## 2021-04-20 ENCOUNTER — Telehealth: Payer: Self-pay | Admitting: Internal Medicine

## 2021-04-20 NOTE — Telephone Encounter (Signed)
PT has new [employer] and she is aware that Dr.Scott take this insurance. PT states that the insurance is also needing info before July 1st to continue covering and paying for her citalopram (CELEXA) 20 MG tablet.

## 2021-04-25 NOTE — Telephone Encounter (Signed)
Mychart sent to patient.

## 2021-05-01 NOTE — Telephone Encounter (Signed)
Patient is using citalopram 30mg -40mg  q day. She has been doing this for over a year. Her insurance has sent her a quantity limitation saying beginning 05/25/21 it will no longer be covered if written this way. She has enough medication to last until August. Picked up 90 day on 04/12/21. Spoke with pharmacy to clarify how it needs to be written in order for it to be covered. Pharmacy stated it should be written:  Citalopram 40mg - 1/2-1 tablet q day Citalopram 10mg - 1 tablet q day prn with 1/2 tablet of 40 mg (to equal 30 mg dose)  Are you okay with me sending it in like this?

## 2021-05-01 NOTE — Telephone Encounter (Signed)
I am ok to change prescription, but she needs a f/u appt with me before 07/2021.

## 2021-05-18 ENCOUNTER — Other Ambulatory Visit: Payer: Self-pay

## 2021-05-18 ENCOUNTER — Ambulatory Visit: Payer: Self-pay

## 2021-05-18 ENCOUNTER — Ambulatory Visit
Admission: EM | Admit: 2021-05-18 | Discharge: 2021-05-18 | Disposition: A | Discharge status: Home / Self Care | Payer: BLUE CROSS/BLUE SHIELD | Attending: Physician Assistant | Admitting: Physician Assistant

## 2021-05-18 ENCOUNTER — Encounter: Payer: Self-pay | Admitting: Emergency Medicine

## 2021-05-18 DIAGNOSIS — J019 Acute sinusitis, unspecified: Secondary | ICD-10-CM | POA: Diagnosis not present

## 2021-05-18 DIAGNOSIS — R0981 Nasal congestion: Secondary | ICD-10-CM

## 2021-05-18 DIAGNOSIS — J3489 Other specified disorders of nose and nasal sinuses: Secondary | ICD-10-CM

## 2021-05-18 MED ORDER — DOXYCYCLINE HYCLATE 100 MG PO CAPS: 100.0000 mg | ORAL_CAPSULE | Freq: Two times a day (BID) | ORAL | 0 refills | Status: AC

## 2021-05-18 NOTE — ED Provider Notes (Signed)
[facility]    CSN: [id number] Arrival date & time: 05/18/21  1655      History   Chief Complaint Chief Complaint  Patient presents with   Cough    HPI Robin Kelly is a 55 y.o. female presenting for 2-week history of cough, nasal congestion and mild sore throat.  Patient says that over the past 3 to 4 days she has developed sinus pain and green nasal drainage.  She denies any sick contacts and no known exposure to COVID-19.  Patient says she took an at home COVID test which was negative.  She has been using Afrin, Mucinex and Zyrtec.  She says the nasal spray does help with the other medications have not seem to make much of a difference.  She denies any fevers, chest discomfort, breathing difficulty, nausea/vomiting or diarrhea.  Patient admits to a history of recurrent sinus infections and says she only gets 2-3 a year.  Patient has not seen an ENT specialist for this.  Patient was seen at [facility] urgent care last month for similar complaints and was prescribed Levaquin and prednisone.  Patient says she does not think those medications helped.  She says she did get better though.  Patient has significant medical history for seasonal allergies and rheumatoid arthritis.  She does take methotrexate and had Adalimumab.  No other concerns.  HPI  Past Medical History:  Diagnosis Date   Allergy    Anemia    Anxiety    due to heavy bleeding and SOB   Arthritis    Cancer ([place]) 2004   melanoma   Heavy menstrual bleeding    Rheumatoid arthritis ([place])    Seasonal allergies    Shortness of breath dyspnea    "due to anemia"    Patient Active Problem List   Diagnosis Date Noted   Rash 08/21/2019   Colon cancer screening 08/21/2019   Cervical cancer screening 08/21/2019   Stress 04/05/2019   Leukocytosis 12/13/2018   Inflammatory arthritis 12/13/2018   Elevated blood pressure reading 12/13/2018   Thrombocytosis 10/13/2017   Seronegative arthritis 05/14/2017   Health  care maintenance 04/20/2017   Left knee pain 03/09/2017   Pain of right heel 09/15/2016   Anemia 03/14/2016   Status post abdominal hysterectomy 03/12/2016   Status post hysterectomy 03/12/2016   Menorrhagia with irregular cycle 02/06/2016   Endometrial polyp 02/06/2016   Fibroids 02/06/2016    Past Surgical History:  Procedure Laterality Date   ABDOMINAL HYSTERECTOMY     APPENDECTOMY  2001   right tube and ovary removed also.   BREAST BIOPSY Right 07/28/2019   [place] bx, - BENIGN MAMMARY PARENCHYMA WITH FIBROCYSTIC    CESAREAN SECTION     x2   CHOLECYSTECTOMY     CYSTOSCOPY N/A 03/12/2016   Procedure: CYSTOSCOPY;  Surgeon: Will Bonnet, MD;  Location: [facility];  Service: Gynecology;  Laterality: N/A;   DILATATION & CURETTAGE/HYSTEROSCOPY WITH MYOSURE N/A 02/06/2016   Procedure: DILATATION & CURETTAGE HYSTEROSCOPY  polypectomy;  Surgeon: Will Bonnet, MD;  Location: [facility];  Service: Gynecology;  Laterality: N/A;   POLYPECTOMY N/A 02/06/2016   Procedure: POLYPECTOMY;  Surgeon: Will Bonnet, MD;  Location: [facility];  Service: Gynecology;  Laterality: N/A;   rheumatoid arthritis     ROBOTIC ASSISTED TOTAL HYSTERECTOMY WITH SALPINGECTOMY Bilateral 03/12/2016   Converted to total abdominal hysterectom   SALPINGOOPHORECTOMY Right    during appendectomy.    OB History   No obstetric history  on file.      Home Medications    Prior to Admission medications   Medication Sig Start Date End Date Taking? Authorizing Provider  Adalimumab 40 MG/0.8ML PNKT Inject 40 mg into the skin every 14 (fourteen) days.   Yes [provider]  aspirin 325 MG tablet Take 325 mg by mouth daily.   Yes [provider]  citalopram (CELEXA) 20 MG tablet TAKE 1.5 TABLETS BY MOUTH EVERY DAY 09/28/20  Yes Einar Pheasant, MD  doxycycline (VIBRAMYCIN) 100 MG capsule Take 1 capsule (100 mg total) by mouth 2 (two) times daily for 7 days. 05/18/21 05/25/21 Yes Laurene Footman B, PA-C   ferrous sulfate 325 (65 FE) MG tablet Take 325 mg by mouth every other day.   Yes [provider]  folic acid (FOLVITE) 1 MG tablet Take 1 mg by mouth daily.   Yes [provider]  methotrexate 50 MG/2ML injection Inject 1 mL as directed once a week. For 12 weeks 03/24/18  Yes [provider]    Family History Family History  Problem Relation Age of Onset   Hypertension Mother    Hypertension Father    Heart failure Father    Cancer Father    Breast cancer Neg Hx     Social History Social History   Tobacco Use   Smoking status: Never   Smokeless tobacco: Never  Vaping Use   Vaping Use: Never used  Substance Use Topics   Alcohol use: No   Drug use: No     Allergies   Penicillins and Oxycodone   Review of Systems Review of Systems  Constitutional:  Positive for fatigue. Negative for chills, diaphoresis and fever.  HENT:  Positive for congestion, rhinorrhea, sinus pressure and sinus pain. Negative for ear pain and sore throat.   Respiratory:  Positive for cough. Negative for shortness of breath.   Cardiovascular:  Negative for chest pain.  Gastrointestinal:  Negative for abdominal pain, nausea and vomiting.  Musculoskeletal:  Negative for arthralgias and myalgias.  Skin:  Negative for rash.  Allergic/Immunologic: Positive for environmental allergies.  Neurological:  Negative for weakness and headaches.  Hematological:  Negative for adenopathy.    Physical Exam Triage Vital Signs ED Triage Vitals  Enc Vitals Group     BP 05/18/21 1731 129/78     Pulse Rate 05/18/21 1731 78     Resp 05/18/21 1731 14     Temp 05/18/21 1731 98.2 F (36.8 C)     Temp Source 05/18/21 1731 Oral     SpO2 05/18/21 1731 100 %     Weight 05/18/21 1728 170 lb (77.1 kg)     Height 05/18/21 1728 5\' 2"  (1.575 m)     Head Circumference --      Peak Flow --      Pain Score 05/18/21 1728 6     Pain Loc --      Pain Edu? --      Excl. in [place]? --    No data  found.  Updated Vital Signs BP 129/78 (BP Location: Left Arm)   Pulse 78   Temp 98.2 F (36.8 C) (Oral)   Resp 14   Ht 5\' 2"  (1.575 m)   Wt 170 lb (77.1 kg)   LMP 10/23/2015 Comment: bleeding since 10/23/15  SpO2 100%   BMI 31.09 kg/m       Physical Exam Vitals and nursing note reviewed.  Constitutional:      General: She is  not in acute distress.    Appearance: Normal appearance. She is not ill-appearing or toxic-appearing.  HENT:     Head: Normocephalic and atraumatic.     Right Ear: Tympanic membrane, ear canal and external ear normal.     Left Ear: Tympanic membrane, ear canal and external ear normal.     Nose: Congestion and rhinorrhea present.     Mouth/Throat:     Mouth: Mucous membranes are moist.     Pharynx: Oropharynx is clear.  Eyes:     General: No scleral icterus.       Right eye: No discharge.        Left eye: No discharge.     Conjunctiva/sclera: Conjunctivae normal.  Cardiovascular:     Rate and Rhythm: Normal rate and regular rhythm.     Heart sounds: Normal heart sounds.  Pulmonary:     Effort: Pulmonary effort is normal. No respiratory distress.     Breath sounds: Normal breath sounds. No wheezing, rhonchi or rales.  Musculoskeletal:     Cervical back: Neck supple.  Skin:    General: Skin is dry.  Neurological:     General: No focal deficit present.     Mental Status: She is alert. Mental status is at baseline.     Motor: No weakness.     Gait: Gait normal.  Psychiatric:        Mood and Affect: Mood normal.        Behavior: Behavior normal.        Thought Content: Thought content normal.     UC Treatments / Results  Labs (all labs ordered are listed, but only abnormal results are displayed) Labs Reviewed - No data to display  EKG   Radiology No results found.  Procedures Procedures (including critical care time)  Medications Ordered in UC Medications - No data to display  Initial Impression / Assessment and Plan / UC Course   I have reviewed the triage vital signs and the nursing notes.  Pertinent labs & imaging results that were available during my care of the patient were reviewed by me and considered in my medical decision making (see chart for details).  55 year old female presenting for 2-week history of cough and nasal congestion with new onset sinus pain and green nasal drainage over the past couple of days.  Patient reports history of recurrent sinus infections.  Recently treated with Levaquin last month.  Vitals are all stable and she is overall well-appearing today.  She does have some congestion on exam and light yellow rhinorrhea.  Chest is clear to auscultation heart regular rate and rhythm.  Patient is immunocompromised due to her history of rheumatoid arthritis and the fact that she is on immunosuppressive medications.  Treating for sinus infection at this time with doxycycline.  She has a penicillin allergy.  I have advised her to continue other medications that she is taking for her symptoms.  Advised increased rest and fluids.  Advised her to consider a HEPA filter in her house.  Advised patient to follow-up with PCP if symptoms continue as she may need a referral to ENT specialist.   Final Clinical Impressions(s) / UC Diagnoses   Final diagnoses:  Acute sinusitis, recurrence not specified, unspecified location  Nasal congestion  Sinus pain     Discharge Instructions      I have sent an antibiotic.  Taking the Mucinex and Zyrtec.  Continue with nasal spray.  Consider use of nasal saline sinus rinses  as well.  If you have another sinus infection this year then you need to see ENT.  If the sinus infection is not getting better with this antibiotic then you should see ENT.   ED Prescriptions     Medication Sig Dispense Auth. Provider   doxycycline (VIBRAMYCIN) 100 MG capsule Take 1 capsule (100 mg total) by mouth 2 (two) times daily for 7 days. 14 capsule Danton Clap, PA-C      PDMP not  reviewed this encounter.   Danton Clap, PA-C 05/18/21 1830

## 2021-05-18 NOTE — Discharge Instructions (Addendum)
I have sent an antibiotic.  Taking the Mucinex and Zyrtec.  Continue with nasal spray.  Consider use of nasal saline sinus rinses as well.  If you have another sinus infection this year then you need to see ENT.  If the sinus infection is not getting better with this antibiotic then you should see ENT.

## 2021-05-18 NOTE — ED Triage Notes (Signed)
Patient c/o cough and chest congestion, sinus pressure, and green drainage that started 2 weeks ago.  Patient states that her symptoms have gotten worse over the past 3-4 days.  Patient denies fevers.  Patient took home covid test today and was negative.

## 2021-06-05 ENCOUNTER — Ambulatory Visit
Admission: RE | Admit: 2021-06-05 | Discharge: 2021-06-05 | Disposition: A | Discharge status: Home / Self Care | Payer: BLUE CROSS/BLUE SHIELD | Source: Ambulatory Visit | Attending: Emergency Medicine | Admitting: Emergency Medicine

## 2021-06-05 ENCOUNTER — Other Ambulatory Visit: Payer: Self-pay

## 2021-06-05 VITALS — BP 117/82 | HR 80 | Temp 98.6°F | Resp 16

## 2021-06-05 DIAGNOSIS — J0101 Acute recurrent maxillary sinusitis: Secondary | ICD-10-CM

## 2021-06-05 DIAGNOSIS — Z8616 Personal history of COVID-19: Secondary | ICD-10-CM | POA: Diagnosis not present

## 2021-06-05 MED ORDER — FLUTICASONE PROPIONATE 50 MCG/ACT NA SUSP: 2.0000 | Freq: Every day | NASAL | 0 refills | Status: DC

## 2021-06-05 MED ORDER — CEFDINIR 300 MG PO CAPS: 300.0000 mg | ORAL_CAPSULE | Freq: Two times a day (BID) | ORAL | 0 refills | Status: AC

## 2021-06-05 NOTE — ED Provider Notes (Signed)
HPI  SUBJECTIVE:  Robin Kelly is a 55 y.o. female who presents with fevers T-max 103 starting several days ago and a cough.  She states that the fevers have resolved.  She reports 2 days of right ear, right sore throat, right maxillary sinus pain with nasal congestion, rhinorrhea, cough productive of the same greenish material as her nasal congestion, and postnasal drip.  She reports a "knot/bump" on her upper right hard palate for the past 3 days which she states is diminishing in size.  She wears dentures on her upper teeth, and states that she has not had any problems with t the bridge recently.  She was diagnosed with COVID on 7/5 and she has been boosted.  No facial swelling, headache.  She has tried Flonase and saline mist with improvement in her symptoms.  No aggravating factors.  She has a past medical history of rheumatoid arthritis and is on methotrexate, Humira, has not taken these in a few weeks, also has a history of frequent sinusitis.  She was treated with Levaquin in May and doxycycline in late June for sinusitis.  She also has a history of varicella.  No history of diabetes, hypertension, shingles.  PMD: Lavone Nian.  ENT: None.    Past Medical History:  Diagnosis Date   Allergy    Anemia    Anxiety    due to heavy bleeding and SOB   Arthritis    Cancer ([place]) 2004   melanoma   Heavy menstrual bleeding    Rheumatoid arthritis (HCC)    Seasonal allergies    Shortness of breath dyspnea    "due to anemia"    Past Surgical History:  Procedure Laterality Date   ABDOMINAL HYSTERECTOMY     APPENDECTOMY  2001   right tube and ovary removed also.   BREAST BIOPSY Right 07/28/2019   [place] bx, - BENIGN MAMMARY PARENCHYMA WITH FIBROCYSTIC    CESAREAN SECTION     x2   CHOLECYSTECTOMY     CYSTOSCOPY N/A 03/12/2016   Procedure: CYSTOSCOPY;  Surgeon: Will Bonnet, MD;  Location: [facility];  Service: Gynecology;  Laterality: N/A;   DILATATION & CURETTAGE/HYSTEROSCOPY WITH  MYOSURE N/A 02/06/2016   Procedure: DILATATION & CURETTAGE HYSTEROSCOPY  polypectomy;  Surgeon: Will Bonnet, MD;  Location: [facility];  Service: Gynecology;  Laterality: N/A;   POLYPECTOMY N/A 02/06/2016   Procedure: POLYPECTOMY;  Surgeon: Will Bonnet, MD;  Location: [facility];  Service: Gynecology;  Laterality: N/A;   rheumatoid arthritis     ROBOTIC ASSISTED TOTAL HYSTERECTOMY WITH SALPINGECTOMY Bilateral 03/12/2016   Converted to total abdominal hysterectom   SALPINGOOPHORECTOMY Right    during appendectomy.    Family History  Problem Relation Age of Onset   Hypertension Mother    Hypertension Father    Heart failure Father    Cancer Father    Breast cancer Neg Hx     Social History   Tobacco Use   Smoking status: Never   Smokeless tobacco: Never  Vaping Use   Vaping Use: Never used  Substance Use Topics   Alcohol use: No   Drug use: No    No current facility-administered medications for this encounter.  Current Outpatient Medications:    cefdinir (OMNICEF) 300 MG capsule, Take 1 capsule (300 mg total) by mouth 2 (two) times daily for 7 days., Disp: 14 capsule, Rfl: 0   fluticasone (FLONASE) 50 MCG/ACT nasal spray, Place 2 sprays into both nostrils daily., Disp: 16  g, Rfl: 0   Adalimumab 40 MG/0.8ML PNKT, Inject 40 mg into the skin every 14 (fourteen) days., Disp: , Rfl:    aspirin 325 MG tablet, Take 325 mg by mouth daily., Disp: , Rfl:    citalopram (CELEXA) 20 MG tablet, TAKE 1.5 TABLETS BY MOUTH EVERY DAY, Disp: 135 tablet, Rfl: 1   ferrous sulfate 325 (65 FE) MG tablet, Take 325 mg by mouth every other day., Disp: , Rfl:    folic acid (FOLVITE) 1 MG tablet, Take 1 mg by mouth daily., Disp: , Rfl:    methotrexate 50 MG/2ML injection, Inject 1 mL as directed once a week. For 12 weeks, Disp: , Rfl:   Allergies  Allergen Reactions   Penicillins Swelling   Oxycodone Palpitations     ROS  As noted in HPI.   Physical Exam  BP 117/82   Pulse 80   Temp  98.6 F (37 C) (Oral)   Resp 16   LMP 10/23/2015 Comment: bleeding since 10/23/15  SpO2 97%   Constitutional: Well developed, well nourished, no acute distress Eyes:  EOMI, conjunctiva normal bilaterally HENT: Normocephalic, atraumatic,mucus membranes moist.  Erythematous, swollen turbinates.  Purulent rhinorrhea.  Positive right maxillary sinus tenderness.  No other sinus tenderness.  No other facial tenderness.  Right external ear, EAC, TM normal.  No pain with traction of pinna, palpation of tragus, palpation of mastoid.  Positive area of erythema with no induration or swelling on right top hard palate where the dentures end,  Skin is intact.  Oropharynx normal. Neck: Tender single cervical lymph node right side. Respiratory: Normal inspiratory effort Cardiovascular: Normal rate GI: nondistended skin: No facial rash, skin intact Musculoskeletal: no deformities Neurologic: Alert & oriented x 3, no focal neuro deficits Psychiatric: Speech and behavior appropriate   ED Course   Medications - No data to display  No orders of the defined types were placed in this encounter.   No results found for this or any previous visit (from the past 24 hour(s)). No results found.  ED Clinical Impression  1. Acute recurrent maxillary sinusitis   2. History of COVID-19      ED Assessment/Plan  Patient with a recurrent sinusitis.  Shingles in the differential with erythematous spot over her hard palate, but there is no facial rash, facial tenderness.  Discussed with patient that it is difficullt to make the diagnosis until the rash shows up.  She is tender only over the right maxillary sinus.  She has purulent nasal drainage.  She reports hives with penicillins over 40 years ago.  She is not sure if she is ever tolerated cephalosporins, but she is willing to try.  Discussed small chance of cross-reactivity but patient is willing to try it.  Given recent Levaquin and doxycycline course, will try  this.  If she has any reaction, she is to call here and we can stop the [facility] and start her on Levaquin instead.  Advised saline nasal irrigation, Flonase.  Follow-up with ENT of choice ASAP.  Discussed MDM, treatment plan, and plan for follow-up with patient. patient agrees with plan.   Meds ordered this encounter  Medications   cefdinir (OMNICEF) 300 MG capsule    Sig: Take 1 capsule (300 mg total) by mouth 2 (two) times daily for 7 days.    Dispense:  14 capsule    Refill:  0   fluticasone (FLONASE) 50 MCG/ACT nasal spray    Sig: Place 2 sprays into both nostrils daily.  Dispense:  16 g    Refill:  0      *This clinic note was created using [employer]. Therefore, there may be occasional mistakes despite careful proofreading.  ?    Melynda Ripple, MD 06/06/21 [phone]

## 2021-06-05 NOTE — Discharge Instructions (Addendum)
Start some saline nasal irrigation with a Mil[facility] Med rinse and distilled water as often as you want to rinse the infection out of your sinuses.  Start the [facility] as well.  Try the Omnicef.  If you start having a reaction to it, let us know and we will switch you to Levaquin.  If your lips swell, you have difficulty breathing, shortness of breath, or for any concerns, take 50 mg of Benadryl and go immediately to the emergency department.  I think you will tolerate this without any problem.  This will open up a lot of therapeutic options for you.  Follow-up with Dr. Tami Ribas as soon as you possibly can.

## 2021-06-05 NOTE — ED Triage Notes (Signed)
PT reports congestion, ear pain, facial pain in right side.   PT felt ill 7/3, tested poitive for covid 7/5.   Reports dental issue on right side as well.

## 2021-06-28 ENCOUNTER — Telehealth: Payer: Self-pay | Admitting: Internal Medicine

## 2021-06-28 ENCOUNTER — Other Ambulatory Visit: Payer: Self-pay | Admitting: Internal Medicine

## 2021-06-28 DIAGNOSIS — Z1231 Encounter for screening mammogram for malignant neoplasm of breast: Secondary | ICD-10-CM

## 2021-06-28 NOTE — Telephone Encounter (Signed)
Patient called to see if her paperwork for a mammogram can be sent to [facility] to have her mammogram done before her annual appointment with Dr.Scott on 08/01/21.

## 2021-06-28 NOTE — Progress Notes (Signed)
Order placed for mammogram.

## 2021-06-28 NOTE — Telephone Encounter (Signed)
Order placed for mammogram.  Last 07/10/20.  b

## 2021-07-07 ENCOUNTER — Other Ambulatory Visit: Payer: Self-pay | Admitting: Internal Medicine

## 2021-08-01 ENCOUNTER — Encounter: Payer: Self-pay | Admitting: Internal Medicine

## 2021-08-01 ENCOUNTER — Ambulatory Visit (INDEPENDENT_AMBULATORY_CARE_PROVIDER_SITE_OTHER): Payer: BLUE CROSS/BLUE SHIELD | Admitting: Internal Medicine

## 2021-08-01 ENCOUNTER — Other Ambulatory Visit: Payer: Self-pay

## 2021-08-01 DIAGNOSIS — D75839 Thrombocytosis, unspecified: Secondary | ICD-10-CM | POA: Diagnosis not present

## 2021-08-01 DIAGNOSIS — M1990 Unspecified osteoarthritis, unspecified site: Secondary | ICD-10-CM

## 2021-08-01 DIAGNOSIS — D649 Anemia, unspecified: Secondary | ICD-10-CM

## 2021-08-01 DIAGNOSIS — F439 Reaction to severe stress, unspecified: Secondary | ICD-10-CM | POA: Diagnosis not present

## 2021-08-01 DIAGNOSIS — Z1211 Encounter for screening for malignant neoplasm of colon: Secondary | ICD-10-CM | POA: Diagnosis not present

## 2021-08-01 DIAGNOSIS — Z1231 Encounter for screening mammogram for malignant neoplasm of breast: Secondary | ICD-10-CM

## 2021-08-01 DIAGNOSIS — Z124 Encounter for screening for malignant neoplasm of cervix: Secondary | ICD-10-CM

## 2021-08-01 NOTE — Progress Notes (Signed)
Patient ID: Robin Kelly, female   DOB: Oct 19, 1966, 55 y.o.   MRN: [id number]   Subjective:    Patient ID: Robin Kelly, female    DOB: 1966/01/22, 55 y.o.   MRN: [id number]  This visit occurred during the SARS-CoV-2 public health emergency.  Safety protocols were in place, including screening questions prior to the visit, additional usage of staff PPE, and extensive cleaning of exam room while observing appropriate contact time as indicated for disinfecting solutions.   Patient here for a scheduled follow up.  Chief Complaint  Patient presents with   Hypertension   Depression   .   HPI States she is doing relatively well.  Insurance had changed.  She is going to have to get a new PCP.  Had questions regarding this.  Is seen at [facility] for gyn.  States she is up to date with breast, pelvic and pap smears.  On citalopram.  Feels this works well for her.  Has been working around her house.  No chest pain or sob with increased activity or exertion.  Had covid 05/2021.  No residual problems.  No acid reflux reported.  No abdominal pain.  Bowels moving.  Seeing rheumatology for RA.  On MTX and humira.  Stable.     Past Medical History:  Diagnosis Date   Allergy    Anemia    Anxiety    due to heavy bleeding and SOB   Arthritis    Cancer ([place]) 2004   melanoma   Heavy menstrual bleeding    Rheumatoid arthritis (HCC)    Seasonal allergies    Shortness of breath dyspnea    "due to anemia"   Past Surgical History:  Procedure Laterality Date   ABDOMINAL HYSTERECTOMY     APPENDECTOMY  2001   right tube and ovary removed also.   BREAST BIOPSY Right 07/28/2019   [place] bx, - BENIGN MAMMARY PARENCHYMA WITH FIBROCYSTIC    CESAREAN SECTION     x2   CHOLECYSTECTOMY     CYSTOSCOPY N/A 03/12/2016   Procedure: CYSTOSCOPY;  Surgeon: Will Bonnet, MD;  Location: [facility];  Service: Gynecology;  Laterality: N/A;   DILATATION & CURETTAGE/HYSTEROSCOPY WITH MYOSURE N/A 02/06/2016   Procedure:  DILATATION & CURETTAGE HYSTEROSCOPY  polypectomy;  Surgeon: Will Bonnet, MD;  Location: [facility];  Service: Gynecology;  Laterality: N/A;   POLYPECTOMY N/A 02/06/2016   Procedure: POLYPECTOMY;  Surgeon: Will Bonnet, MD;  Location: [facility];  Service: Gynecology;  Laterality: N/A;   rheumatoid arthritis     ROBOTIC ASSISTED TOTAL HYSTERECTOMY WITH SALPINGECTOMY Bilateral 03/12/2016   Converted to total abdominal hysterectom   SALPINGOOPHORECTOMY Right    during appendectomy.   Family History  Problem Relation Age of Onset   Hypertension Mother    Hypertension Father    Heart failure Father    Cancer Father    Breast cancer Neg Hx    Social History   Socioeconomic History   Marital status: Married    Spouse name: Not on file   Number of children: Not on file   Years of education: Not on file   Highest education level: Not on file  Occupational History   Not on file  Tobacco Use   Smoking status: Never   Smokeless tobacco: Never  Vaping Use   Vaping Use: Never used  Substance and Sexual Activity   Alcohol use: No   Drug use: No   Sexual activity: Not on file  Other Topics Concern   Not on file  Social History Narrative   Not on file   Social Determinants of Health   Financial Resource Strain: Not on file  Food Insecurity: Not on file  Transportation Needs: Not on file  Physical Activity: Not on file  Stress: Not on file  Social Connections: Not on file    Review of Systems  Constitutional:  Negative for appetite change and unexpected weight change.  HENT:  Negative for congestion and sinus pressure.   Respiratory:  Negative for cough, chest tightness and shortness of breath.   Cardiovascular:  Negative for chest pain, palpitations and leg swelling.  Gastrointestinal:  Negative for abdominal pain, diarrhea, nausea and vomiting.  Genitourinary:  Negative for difficulty urinating and dysuria.  Musculoskeletal:  Negative for myalgias.       Joints stable.    Skin:  Negative for color change and rash.  Neurological:  Negative for dizziness, light-headedness and headaches.  Psychiatric/Behavioral:  Negative for agitation and dysphoric mood.       Objective:     BP 120/72   Pulse 90   Temp 97.8 F (36.6 C)   Resp 16   Ht '5\' 2"'$  (1.575 m)   Wt 178 lb 3.2 oz (80.8 kg)   LMP 10/23/2015 Comment: bleeding since 10/23/15  SpO2 97%   BMI 32.59 kg/m  Wt Readings from Last 3 Encounters:  08/01/21 178 lb 3.2 oz (80.8 kg)  05/18/21 170 lb (77.1 kg)  09/13/20 185 lb (83.9 kg)    Physical Exam Vitals reviewed.  Constitutional:      General: She is not in acute distress.    Appearance: Normal appearance.  HENT:     Head: Normocephalic and atraumatic.     Right Ear: External ear normal.     Left Ear: External ear normal.  Eyes:     General: No scleral icterus.       Right eye: No discharge.        Left eye: No discharge.     Conjunctiva/sclera: Conjunctivae normal.  Neck:     Thyroid: No thyromegaly.  Cardiovascular:     Rate and Rhythm: Normal rate and regular rhythm.  Pulmonary:     Effort: No respiratory distress.     Breath sounds: Normal breath sounds. No wheezing.  Abdominal:     General: Bowel sounds are normal.     Palpations: Abdomen is soft.     Tenderness: There is no abdominal tenderness.  Musculoskeletal:        General: No swelling or tenderness.     Cervical back: Neck supple. No tenderness.  Lymphadenopathy:     Cervical: No cervical adenopathy.  Skin:    Findings: No erythema or rash.  Neurological:     Mental Status: She is alert.  Psychiatric:        Mood and Affect: Mood normal.        Behavior: Behavior normal.     Outpatient Encounter Medications as of 08/01/2021  Medication Sig   Adalimumab 40 MG/0.8ML PNKT Inject 40 mg into the skin every 14 (fourteen) days.   aspirin 325 MG tablet Take 325 mg by mouth daily.   citalopram (CELEXA) 20 MG tablet TAKE 1 AND 1/2 - TWO TABLETS BY MOUTH EVERY DAY    ferrous sulfate 325 (65 FE) MG tablet Take 325 mg by mouth every other day.   fluticasone (FLONASE) 50 MCG/ACT nasal spray Place 2 sprays into both nostrils daily.  folic acid (FOLVITE) 1 MG tablet Take 1 mg by mouth daily.   methotrexate 50 MG/2ML injection Inject 1 mL as directed once a week. For 12 weeks   No facility-administered encounter medications on file as of 08/01/2021.     Lab Results  Component Value Date   WBC 6.6 09/14/2019   HGB 14.6 09/14/2019   HCT 42.8 09/14/2019   PLT 382.0 09/14/2019   GLUCOSE 134 (H) 09/14/2019   CHOL 170 09/14/2019   TRIG 164.0 (H) 09/14/2019   HDL 38.80 (L) 09/14/2019   LDLCALC 98 09/14/2019   ALT 15 09/14/2019   AST 17 09/14/2019   NA 136 09/14/2019   K 4.2 09/14/2019   CL 101 09/14/2019   CREATININE 0.75 09/14/2019   BUN 12 09/14/2019   CO2 25 09/14/2019   TSH 1.57 04/05/2019       Assessment & Plan:   Problem List Items Addressed This Visit     Anemia    Wants to hold on labs since insurance not covering visit.  Just had some labs through rheumatology.        Breast cancer screening    Discussed f/u mammogram.  She wants to check where is covered.  Information given.       Cervical cancer screening    If followed at [facility].  Will arrange f/u.        Colon cancer screening    cologuard 10/2020 - negative.       Inflammatory arthritis    Continue f/u with rheumatology.  Continues on humira and MTX.        Stress    Overall appears to be handling things relatively well.  Has good support.  Continue citalopram.  Follow       Thrombocytosis    Platelet count just checked through rheumatology wnl.          Einar Pheasant, MD

## 2021-08-01 NOTE — Patient Instructions (Signed)
Labs:  lipid panel and met b  [place] - [phone]

## 2021-08-05 ENCOUNTER — Encounter: Payer: Self-pay | Admitting: Internal Medicine

## 2021-08-05 DIAGNOSIS — Z1239 Encounter for other screening for malignant neoplasm of breast: Secondary | ICD-10-CM | POA: Insufficient documentation

## 2021-08-05 NOTE — Assessment & Plan Note (Signed)
cologuard 10/2020 - negative.  

## 2021-08-05 NOTE — Assessment & Plan Note (Signed)
Continue f/u with rheumatology.  Continues on humira and MTX.

## 2021-08-05 NOTE — Assessment & Plan Note (Signed)
Discussed f/u mammogram.  She wants to check where is covered.  Information given.

## 2021-08-05 NOTE — Assessment & Plan Note (Signed)
Platelet count just checked through rheumatology wnl.

## 2021-08-05 NOTE — Assessment & Plan Note (Signed)
Wants to hold on labs since insurance not covering visit.  Just had some labs through rheumatology.

## 2021-08-05 NOTE — Assessment & Plan Note (Signed)
Overall appears to be handling things relatively well.  Has good support.  Continue citalopram.  Follow

## 2021-08-05 NOTE — Assessment & Plan Note (Signed)
If followed at [facility].  Will arrange f/u.

## 2021-08-06 ENCOUNTER — Other Ambulatory Visit: Payer: Self-pay | Admitting: Internal Medicine

## 2021-08-07 ENCOUNTER — Encounter: Payer: Self-pay | Admitting: Internal Medicine

## 2021-08-08 NOTE — Telephone Encounter (Signed)
Reviewed.  Noted.  Let me know if need to do anything about citalopram.

## 2021-08-09 ENCOUNTER — Other Ambulatory Visit: Payer: Self-pay

## 2021-08-09 MED ORDER — CITALOPRAM HYDROBROMIDE 40 MG PO TABS: 40.0000 mg | ORAL_TABLET | Freq: Every day | ORAL | 0 refills | Status: DC

## 2021-08-09 NOTE — Telephone Encounter (Signed)
Discussed with patient. Insurance will only pay for 1 tab q day. Patient is taking '40mg'$  most days. Sent in new rx for citalopram 40 mg q day per Dr Nicki Reaper

## 2021-11-04 ENCOUNTER — Other Ambulatory Visit: Payer: Self-pay | Admitting: Internal Medicine

## 2021-12-10 ENCOUNTER — Other Ambulatory Visit: Payer: Self-pay | Admitting: Internal Medicine

## 2022-01-03 ENCOUNTER — Ambulatory Visit (INDEPENDENT_AMBULATORY_CARE_PROVIDER_SITE_OTHER): Payer: 59 | Admitting: Dermatology

## 2022-01-03 ENCOUNTER — Other Ambulatory Visit: Payer: Self-pay

## 2022-01-03 DIAGNOSIS — L304 Erythema intertrigo: Secondary | ICD-10-CM

## 2022-01-03 DIAGNOSIS — L814 Other melanin hyperpigmentation: Secondary | ICD-10-CM | POA: Diagnosis not present

## 2022-01-03 DIAGNOSIS — L578 Other skin changes due to chronic exposure to nonionizing radiation: Secondary | ICD-10-CM

## 2022-01-03 DIAGNOSIS — L821 Other seborrheic keratosis: Secondary | ICD-10-CM | POA: Diagnosis not present

## 2022-01-03 DIAGNOSIS — Z86007 Personal history of in-situ neoplasm of skin: Secondary | ICD-10-CM | POA: Diagnosis not present

## 2022-01-03 DIAGNOSIS — Z1283 Encounter for screening for malignant neoplasm of skin: Secondary | ICD-10-CM

## 2022-01-03 DIAGNOSIS — D485 Neoplasm of uncertain behavior of skin: Secondary | ICD-10-CM

## 2022-01-03 DIAGNOSIS — L905 Scar conditions and fibrosis of skin: Secondary | ICD-10-CM | POA: Diagnosis not present

## 2022-01-03 DIAGNOSIS — D229 Melanocytic nevi, unspecified: Secondary | ICD-10-CM

## 2022-01-03 DIAGNOSIS — D489 Neoplasm of uncertain behavior, unspecified: Secondary | ICD-10-CM

## 2022-01-03 DIAGNOSIS — D1800 Hemangioma unspecified site: Secondary | ICD-10-CM | POA: Diagnosis not present

## 2022-01-03 NOTE — Patient Instructions (Addendum)
For Intertrigo  Zeasorb Af powder use to help control moisture  - found in antifungal foot section at drug store  Start [facility] Iodoquinol 1%, Hydrocortisone 2.5%, Niacinamide 2% Cream twice a day to affected areas for up to two weeks.  The patient was advised this is not covered by insurance since it is made by a compounding pharmacy. They will receive an email to check out and the medication will be mailed to their home.    Instructions for [facility] Medications  One or more of your medications was sent to the [facility] mail order compounding pharmacy. You will receive an email from them and can purchase the medicine through that link. It will then be mailed to your home at the address you confirmed. If for any reason you do not receive an email from them, please check your spam folder. If you still do not find the email, please let us know. [facility] phone number is [phone].    Discussed the treatment option of BBL/laser.  Typically we recommend 1-3 treatment sessions about 5-8 weeks apart for best results.  The patient's condition may require "maintenance treatments" in the future.  The fee for BBL / laser treatments is $350 per treatment session for the whole face.  A fee can be quoted for other parts of the body. Insurance typically does not pay for BBL/laser treatments and therefore the fee is an out-of-pocket cost.  Biopsy Wound Care Instructions  Leave the original bandage on for 24 hours if possible.  If the bandage becomes soaked or soiled before that time, it is OK to remove it and examine the wound.  A small amount of post-operative bleeding is normal.  If excessive bleeding occurs, remove the bandage, place gauze over the site and apply continuous pressure (no peeking) over the area for 30 minutes. If this does not work, please call our clinic as soon as possible or page your doctor if it is after hours.   Once a day, cleanse the wound with soap  and water. It is fine to shower. If a thick crust develops you may use a Q-tip dipped into dilute hydrogen peroxide (mix 1:1 with water) to dissolve it.  Hydrogen peroxide can slow the healing process, so use it only as needed.    After washing, apply petroleum jelly (Vaseline) or an antibiotic ointment if your doctor prescribed one for you, followed by a bandage.    For best healing, the wound should be covered with a layer of ointment at all times. If you are not able to keep the area covered with a bandage to hold the ointment in place, this may mean re-applying the ointment several times a day.  Continue this wound care until the wound has healed and is no longer open.   Itching and mild discomfort is normal during the healing process. However, if you develop pain or severe itching, please call our office.   If you have any discomfort, you can take Tylenol (acetaminophen) or ibuprofen as directed on the bottle. (Please do not take these if you have an allergy to them or cannot take them for another reason).  Some redness, tenderness and white or yellow material in the wound is normal healing.  If the area becomes very sore and red, or develops a thick yellow-green material (pus), it may be infected; please notify us.    If you have stitches, return to clinic as directed to have the stitches removed. You will  continue wound care for 2-3 days after the stitches are removed.   Wound healing continues for up to one year following surgery. It is not unusual to experience pain in the scar from time to time during the interval.  If the pain becomes severe or the scar thickens, you should notify the office.    A slight amount of redness in a scar is expected for the first six months.  After six months, the redness will fade and the scar will soften and fade.  The color difference becomes less noticeable with time.  If there are any problems, return for a post-op surgery check at your earliest  convenience.  To improve the appearance of the scar, you can use silicone scar gel, cream, or sheets (such as Mederma or Serica) every night for up to one year. These are available over the counter (without a prescription).  Please call our office at [phone] for any questions or concerns.      Seborrheic Keratosis  What causes seborrheic keratoses? Seborrheic keratoses are harmless, common skin growths that first appear during adult life.  As time goes by, more growths appear.  Some people may develop a large number of them.  Seborrheic keratoses appear on both covered and uncovered body parts.  They are not caused by sunlight.  The tendency to develop seborrheic keratoses can be inherited.  They vary in color from skin-colored to gray, brown, or even black.  They can be either smooth or have a rough, warty surface.   Seborrheic keratoses are superficial and look as if they were stuck on the skin.  Under the microscope this type of keratosis looks like layers upon layers of skin.  That is why at times the top layer may seem to fall off, but the rest of the growth remains and re-grows.    Treatment Seborrheic keratoses do not need to be treated, but can easily be removed in the office.  Seborrheic keratoses often cause symptoms when they rub on clothing or jewelry.  Lesions can be in the way of shaving.  If they become inflamed, they can cause itching, soreness, or burning.  Removal of a seborrheic keratosis can be accomplished by freezing, burning, or surgery. If any spot bleeds, scabs, or grows rapidly, please return to have it checked, as these can be an indication of a skin cancer.     Melanoma ABCDEs  Melanoma is the most dangerous type of skin cancer, and is the leading cause of death from skin disease.  You are more likely to develop melanoma if you: Have light-colored skin, light-colored eyes, or red or blond hair Spend a lot of time in the sun Tan regularly, either outdoors  or in a tanning bed Have had blistering sunburns, especially during childhood Have a close family member who has had a melanoma Have atypical moles or large birthmarks  Early detection of melanoma is key since treatment is typically straightforward and cure rates are extremely high if we catch it early.   The first sign of melanoma is often a change in a mole or a new dark spot.  The ABCDE system is a way of remembering the signs of melanoma.  A for asymmetry:  The two halves do not match. B for border:  The edges of the growth are irregular. C for color:  A mixture of colors are present instead of an even brown color. D for diameter:  Melanomas are usually (but not always) greater than 69mm -  the size of a pencil eraser. E for evolution:  The spot keeps changing in size, shape, and color.  Please check your skin once per month between visits. You can use a small mirror in front and a large mirror behind you to keep an eye on the back side or your body.   If you see any new or changing lesions before your next follow-up, please call to schedule a visit.  Please continue daily skin protection including broad spectrum sunscreen SPF 30+ to sun-exposed areas, reapplying every 2 hours as needed when you're outdoors.   Staying in the shade or wearing long sleeves, sun glasses (UVA+UVB protection) and wide brim hats (4-inch brim around the entire circumference of the hat) are also recommended for sun protection.    If You Need Anything After Your Visit  If you have any questions or concerns for your doctor, please call our main line at [phone] and press option 4 to reach your doctor's medical assistant. If no one answers, please leave a voicemail as directed and we will return your call as soon as possible. Messages left after 4 pm will be answered the following business day.   You may also send [place] a message via [place]. We typically respond to MyChart messages within 1-2 business  days.  For prescription refills, please ask your pharmacy to contact our office. Our fax number is [phone].  If you have an urgent issue when the clinic is closed that cannot wait until the next business day, you can page your doctor at the number below.    Please note that while we do our best to be available for urgent issues outside of office hours, we are not available 24/7.   If you have an urgent issue and are unable to reach [place], you may choose to seek medical care at your doctor's office, retail clinic, urgent care center, or emergency room.  If you have a medical emergency, please immediately call 911 or go to the emergency department.  Pager Numbers  - Dr. Nehemiah Massed: [phone]  - Dr. Laurence Ferrari: [phone]  - Dr. Nicole Kindred: [phone]  In the event of inclement weather, please call our main line at [phone] for an update on the status of any delays or closures.  Dermatology Medication Tips: Please keep the boxes that topical medications come in in order to help keep track of the instructions about where and how to use these. Pharmacies typically print the medication instructions only on the boxes and not directly on the medication tubes.   If your medication is too expensive, please contact our office at [phone] option 4 or send [place] a message through [place].   We are unable to tell what your co-pay for medications will be in advance as this is different depending on your insurance coverage. However, we may be able to find a substitute medication at lower cost or fill out paperwork to get insurance to cover a needed medication.   If a prior authorization is required to get your medication covered by your insurance company, please allow [place] 1-2 business days to complete this process.  Drug prices often vary depending on where the prescription is filled and some pharmacies may offer cheaper prices.  The website www.goodrx.com contains coupons for medications through  different pharmacies. The prices here do not account for what the cost may be with help from insurance (it may be cheaper with your insurance), but the website can give you the price if you did not  use any insurance.  - You can print the associated coupon and take it with your prescription to the pharmacy.  - You may also stop by our office during regular business hours and pick up a GoodRx coupon card.  - If you need your prescription sent electronically to a different pharmacy, notify our office through [facility] or by phone at [phone] option 4.     Si Usted Necesita Algo Despus de Su Visita  Tambin puede enviarnos un mensaje a travs de Pharmacist, community. Por lo general respondemos a los mensajes de MyChart en el transcurso de 1 a 2 das hbiles.  Para renovar recetas, por favor pida a su farmacia que se ponga en contacto con nuestra oficina. Harland Dingwall de fax es [place] [phone].  Si tiene un asunto urgente cuando la clnica est cerrada y que no puede esperar hasta el siguiente da hbil, puede llamar/localizar a su doctor(a) al nmero que aparece a continuacin.   Por favor, tenga en cuenta que aunque hacemos todo lo posible para estar disponibles para asuntos urgentes fuera del horario de [place], no estamos disponibles las 24 horas del da, los 7 das de la [place].   Si tiene un problema urgente y no puede comunicarse con nosotros, puede optar por buscar atencin mdica  en el consultorio de su doctor(a), en una clnica privada, en un centro de atencin urgente o en una sala de emergencias.  Si tiene Engineering geologist, por favor llame inmediatamente al 911 o vaya a la sala de emergencias.  Nmeros de bper  - Dr. Nehemiah Massed: [phone]  - Dra. Moye: [phone]  - Dra. Nicole Kindred: [phone]  En caso de inclemencias del [place], por favor llame a Johnsie Kindred principal al [phone] para una actualizacin sobre el [place] de cualquier retraso o cierre.  Consejos  para la medicacin en dermatologa: Por favor, guarde las cajas en las que vienen los medicamentos de uso tpico para ayudarle a seguir las instrucciones sobre dnde y cmo usarlos. Las farmacias generalmente imprimen las instrucciones del medicamento slo en las cajas y no directamente en los tubos del [place].   Si su medicamento es muy caro, por favor, pngase en contacto con Zigmund Daniel llamando al [phone] y presione la opcin 4 o envenos un mensaje a travs de Pharmacist, community.   No podemos decirle cul ser su copago por los medicamentos por adelantado ya que esto es diferente dependiendo de la cobertura de su seguro. Sin embargo, es posible que podamos encontrar un medicamento sustituto a Electrical engineer un formulario para que el seguro cubra el medicamento que se considera necesario.   Si se requiere una autorizacin previa para que su compaa de seguros [place] su medicamento, por favor permtanos de 1 a 2 das hbiles para completar este proceso.  Los precios de los medicamentos varan con frecuencia dependiendo del Environmental consultant de dnde se surte la receta y alguna farmacias pueden ofrecer precios ms baratos.  El sitio web www.goodrx.com tiene cupones para medicamentos de Airline pilot. Los precios aqu no tienen en cuenta lo que podra costar con la ayuda del seguro (puede ser ms barato con su seguro), pero el sitio web puede darle el precio si no utiliz Research scientist (physical sciences).  - Puede imprimir el cupn correspondiente y llevarlo con su receta a la farmacia.  - Tambin puede pasar por nuestra oficina durante el horario de atencin regular y Charity fundraiser una tarjeta de cupones de GoodRx.  - Si necesita que su receta se enve electrnicamente a  una farmacia diferente, informe a nuestra oficina a travs de MyChart de [place] o por telfono llamando al [phone] y presione la opcin 4.

## 2022-01-03 NOTE — Progress Notes (Signed)
New Patient Visit  Subjective  Robin Kelly is a 56 y.o. female who presents for the following: New Patient (Initial Visit) (New patient here today for all over check. Patient reports history of melanoma in 2004 at right upper quadrant. Patient also reports she would like a spot checked at left lower abdomen today near her c-section scar.). The patient presents for Total-Body Skin Exam (TBSE) for skin cancer screening and mole check.  The patient has spots, moles and lesions to be evaluated, some may be new or changing and the patient has concerns that these could be cancer.  The following portions of the chart were reviewed this encounter and updated as appropriate:   Tobacco   Allergies   Meds   Problems   Med Hx   Surg Hx   Fam Hx      Review of Systems:  No other skin or systemic complaints except as noted in HPI or Assessment and Plan.  Objective  Well appearing patient in no apparent distress; mood and affect are within normal limits.  A full examination was performed including scalp, head, eyes, ears, nose, lips, neck, chest, axillae, abdomen, back, buttocks, bilateral upper extremities, bilateral lower extremities, hands, feet, fingers, toes, fingernails, and toenails. All findings within normal limits unless otherwise noted below.  bilateral inframammary folds Erythematous patches   left lateral infraabdominal 0.7 x 0.4 cm hyperpigmented macule         Assessment & Plan  Erythema intertrigo bilateral inframammary folds Intertrigo is a chronic recurrent rash that occurs in skin fold areas that may be associated with friction; heat; moisture; yeast; fungus; and bacteria.  It is exacerbated by increased movement / activity; sweating; and higher atmospheric temperature.  [facility] Iodoquinol 1%, Hydrocortisone 2.5%, Niacinamide 2% Cream twice a day to affected areas for up to two weeks.  The patient was advised this is not covered by insurance since it is made  by a compounding pharmacy. They will receive an email to check out and the medication will be mailed to their home.   Instructions for [facility] Medications  One or more of your medications was sent to the [facility] mail order compounding pharmacy. You will receive an email from them and can purchase the medicine through that link. It will then be mailed to your home at the address you confirmed. If for any reason you do not receive an email from them, please check your spam folder. If you still do not find the email, please let us know. [facility] phone number is [phone].  Neoplasm of uncertain behavior left lateral infraabdominal  Skin / nail biopsy Type of biopsy: tangential   Informed consent: discussed and consent obtained   Timeout: patient name, date of birth, surgical site, and procedure verified   Procedure prep:  Patient was prepped and draped in usual sterile fashion Prep type:  Isopropyl alcohol Anesthesia: the lesion was anesthetized in a standard fashion   Anesthetic:  1% lidocaine w/ epinephrine 1-100,000 buffered w/ 8.4% NaHCO3 Instrument used: flexible razor blade   Hemostasis achieved with: pressure, aluminum chloride and electrodesiccation   Outcome: patient tolerated procedure well   Post-procedure details: sterile dressing applied and wound care instructions given   Dressing type: petrolatum and bandage    Specimen 1 - Surgical pathology Differential Diagnosis: r/o nevus vs dysplastic vs other   Check Margins: No 2 pieces of tissue  R/o nevus vs dysplastic vs other   Actinic skin damage face  Discussed the treatment option of BBL/laser.  Typically we recommend 1-3 treatment sessions about 5-8 weeks apart for best results.  The patient's condition may require "maintenance treatments" in the future.  The fee for BBL / laser treatments is $350 per treatment session for the whole face.  A fee can be quoted for other parts of the body. Insurance  typically does not pay for BBL/laser treatments and therefore the fee is an out-of-pocket cost.  Lentigines - Scattered tan macules - Due to sun exposure - Benign-appearing, observe - Recommend daily broad spectrum sunscreen SPF 30+ to sun-exposed areas, reapply every 2 hours as needed. - Call for any changes  Seborrheic Keratoses - Stuck-on, waxy, tan-brown papules and/or plaques  - Benign-appearing - Discussed benign etiology and prognosis. - Observe - Call for any changes  Melanocytic Nevi - Tan-brown and/or pink-flesh-colored symmetric macules and papules - Benign appearing on exam today - Observation - Call clinic for new or changing moles - Recommend daily use of broad spectrum spf 30+ sunscreen to sun-exposed areas.   Hemangiomas - Red papules - Discussed benign nature - Observe - Call for any changes  Actinic Damage - Chronic condition, secondary to cumulative UV/sun exposure - diffuse scaly erythematous macules with underlying dyspigmentation - Recommend daily broad spectrum sunscreen SPF 30+ to sun-exposed areas, reapply every 2 hours as needed.  - Staying in the shade or wearing long sleeves, sun glasses (UVA+UVB protection) and wide brim hats (4-inch brim around the entire circumference of the hat) are also recommended for sun protection.  - Call for new or changing lesions.  Patient reports History of Squamous Cell Carcinoma in Situ of the Skin Right upper quadrant of abdomen in 2004 - No evidence of recurrence today - Recommend regular full body skin exams - Recommend daily broad spectrum sunscreen SPF 30+ to sun-exposed areas, reapply every 2 hours as needed.  - Call if any new or changing lesions are noted between office visits  Skin cancer screening performed today.  IRuthell Rummage, CMA, am acting as scribe for Sarina Ser, MD.  Return for 1 year tbse .  Documentation: I have reviewed the above documentation for accuracy and completeness, and I  agree with the above.  Sarina Ser, MD

## 2022-01-04 ENCOUNTER — Ambulatory Visit (LOCAL_COMMUNITY_HEALTH_CENTER): Payer: Self-pay

## 2022-01-04 DIAGNOSIS — Z111 Encounter for screening for respiratory tuberculosis: Secondary | ICD-10-CM

## 2022-01-07 ENCOUNTER — Ambulatory Visit (LOCAL_COMMUNITY_HEALTH_CENTER): Payer: Self-pay

## 2022-01-07 ENCOUNTER — Other Ambulatory Visit: Payer: Self-pay

## 2022-01-07 ENCOUNTER — Encounter: Payer: Self-pay | Admitting: Internal Medicine

## 2022-01-07 DIAGNOSIS — Z111 Encounter for screening for respiratory tuberculosis: Secondary | ICD-10-CM

## 2022-01-07 LAB — TB SKIN TEST
Induration: 0 mm
TB Skin Test: NEGATIVE

## 2022-01-07 NOTE — Telephone Encounter (Signed)
Pt called in stating that she sent Dr. Nicki Reaper a mychart message. Pt stated that she would like Dr. Nicki Reaper to fill out an Staff Medical Report so she can return back to work. Pt stated that she doesn't know if she should drop off the paperwork or just wait a little bit before going back to work. Pt was wondering what is the best thing for her to do at this point. Pt requesting callback. Below is a copy [employer].   "Hi there! Trying to get in touch with Robin Kelly - I have a form that I need Dr. Nicki Reaper to fill out for me "Staff Medical Report". I am finally going to try to go back since [place] passed away in a childcare facility. I need to get a physician to sign it. Also, who do I need to speak with about my new [employer] health insurance, Didnt know if [facility] accepted it or not. You may call me or message me. Thank you so much for your time!  Robin Kelly

## 2022-01-08 ENCOUNTER — Encounter: Payer: Self-pay | Admitting: Dermatology

## 2022-01-08 ENCOUNTER — Telehealth: Payer: Self-pay

## 2022-01-08 NOTE — Telephone Encounter (Signed)
-----   Message from Ralene Bathe, MD sent at 01/08/2022 11:00 AM EST ----- Diagnosis Skin , left lateral infra abdominal DERMAL SCAR  Benign scar Recheck next visit

## 2022-01-08 NOTE — Telephone Encounter (Signed)
Patient advised of BX results .aw 

## 2022-01-08 NOTE — Telephone Encounter (Signed)
LM on VM please return my call  

## 2022-01-09 ENCOUNTER — Telehealth: Payer: Self-pay | Admitting: Internal Medicine

## 2022-01-09 DIAGNOSIS — Z1331 Encounter for screening for depression: Secondary | ICD-10-CM | POA: Diagnosis not present

## 2022-01-09 DIAGNOSIS — Z1339 Encounter for screening examination for other mental health and behavioral disorders: Secondary | ICD-10-CM | POA: Diagnosis not present

## 2022-01-09 DIAGNOSIS — Z1231 Encounter for screening mammogram for malignant neoplasm of breast: Secondary | ICD-10-CM | POA: Diagnosis not present

## 2022-01-09 DIAGNOSIS — Z01419 Encounter for gynecological examination (general) (routine) without abnormal findings: Secondary | ICD-10-CM | POA: Diagnosis not present

## 2022-01-09 NOTE — Telephone Encounter (Signed)
Has not seen Dr Nicki Reaper since sept. Needs appt.

## 2022-01-09 NOTE — Telephone Encounter (Signed)
Patient dropped a envelope for Robin Kelly, it is a form to go back to work. Form is up front in Dr [employer] color folder.

## 2022-01-10 NOTE — Telephone Encounter (Signed)
Left voicemail for pt to return call to [place]. Pt needs an appt. Dr. Nicki Reaper has an appt on 01/14/22 @ 12pm.

## 2022-01-11 NOTE — Telephone Encounter (Signed)
My chart sent to patient as well.

## 2022-01-14 ENCOUNTER — Encounter: Payer: Self-pay | Admitting: Internal Medicine

## 2022-01-14 ENCOUNTER — Ambulatory Visit (INDEPENDENT_AMBULATORY_CARE_PROVIDER_SITE_OTHER): Payer: 59 | Admitting: Internal Medicine

## 2022-01-14 ENCOUNTER — Other Ambulatory Visit: Payer: Self-pay

## 2022-01-14 VITALS — BP 134/80 | HR 90 | Temp 98.2°F | Ht 62.0 in | Wt 183.8 lb

## 2022-01-14 DIAGNOSIS — Z0289 Encounter for other administrative examinations: Secondary | ICD-10-CM | POA: Diagnosis not present

## 2022-01-14 DIAGNOSIS — D649 Anemia, unspecified: Secondary | ICD-10-CM | POA: Diagnosis not present

## 2022-01-14 DIAGNOSIS — F439 Reaction to severe stress, unspecified: Secondary | ICD-10-CM

## 2022-01-14 DIAGNOSIS — Z13220 Encounter for screening for lipoid disorders: Secondary | ICD-10-CM | POA: Diagnosis not present

## 2022-01-14 DIAGNOSIS — M069 Rheumatoid arthritis, unspecified: Secondary | ICD-10-CM

## 2022-01-14 LAB — LIPID PANEL
Cholesterol: 197 mg/dL (ref 0–200)
HDL: 53.5 mg/dL (ref >39.00)
LDL Cholesterol: 121 mg/dL — ABNORMAL HIGH (ref 0–99)
NonHDL: 143.86
Total CHOL/HDL Ratio: 4
Triglycerides: 115 mg/dL (ref 0.0–149.0)
VLDL: 23 mg/dL (ref 0.0–40.0)

## 2022-01-14 LAB — BASIC METABOLIC PANEL
BUN: 10 mg/dL (ref 6–23)
CO2: 30 mEq/L (ref 19–32)
Calcium: 9.3 mg/dL (ref 8.4–10.5)
Chloride: 105 mEq/L (ref 96–112)
Creatinine, Ser: 0.69 mg/dL (ref 0.40–1.20)
GFR: 97.07 mL/min (ref >60.00)
Glucose, Bld: 88 mg/dL (ref 70–99)
Potassium: 4.3 mEq/L (ref 3.5–5.1)
Sodium: 138 mEq/L (ref 135–145)

## 2022-01-14 LAB — TSH: TSH: 1.08 u[IU]/mL (ref 0.35–5.50)

## 2022-01-14 NOTE — Assessment & Plan Note (Signed)
Continue humira and MTX.  Doing well.  Follow.

## 2022-01-14 NOTE — Assessment & Plan Note (Signed)
Follow cbc.  

## 2022-01-14 NOTE — Progress Notes (Signed)
Patient ID: Robin Kelly, female   DOB: 01-Feb-1966, 56 y.o.   MRN: [id number]   Subjective:    Patient ID: Robin Kelly, female    DOB: [birth year], 56 y.o.   MRN: [id number]  This visit occurred during the SARS-CoV-2 public health emergency.  Safety protocols were in place, including screening questions prior to the visit, additional usage of staff PPE, and extensive cleaning of exam room while observing appropriate contact time as indicated for disinfecting solutions.   Patient here for a scheduled follow up.   Chief Complaint  Patient presents with   Follow-up    F/u for form    .   HPI Follow up scheduled to have work form completed.  Also here to f/u regarding RA and increased stress.  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  She feels she is doing better. Seeing Dr Posey Pronto for her RA.  On humira and MTX.  Well controlled.  No chest pain or sob reported. Has been exercising.  Has lost weight.  Reports snacking.  Discussed diet and continued exercise.  No abdominal pain. Bowels moving.    Past Medical History:  Diagnosis Date   Allergy    Anemia    Anxiety    due to heavy bleeding and SOB   Arthritis    Cancer ([place]) 2004   melanoma   Heavy menstrual bleeding    Rheumatoid arthritis (HCC)    Seasonal allergies    Shortness of breath dyspnea    "due to anemia"   Past Surgical History:  Procedure Laterality Date   ABDOMINAL HYSTERECTOMY     APPENDECTOMY  2001   right tube and ovary removed also.   BREAST BIOPSY Right 07/28/2019   [place] bx, - BENIGN MAMMARY PARENCHYMA WITH FIBROCYSTIC    CESAREAN SECTION     x2   CHOLECYSTECTOMY     CYSTOSCOPY N/A 03/12/2016   Procedure: CYSTOSCOPY;  Surgeon: Will Bonnet, MD;  Location: [facility];  Service: Gynecology;  Laterality: N/A;   DILATATION & CURETTAGE/HYSTEROSCOPY WITH MYOSURE N/A 02/06/2016   Procedure: DILATATION & CURETTAGE HYSTEROSCOPY  polypectomy;  Surgeon: Will Bonnet, MD;  Location: [facility];  Service:  Gynecology;  Laterality: N/A;   POLYPECTOMY N/A 02/06/2016   Procedure: POLYPECTOMY;  Surgeon: Will Bonnet, MD;  Location: [facility];  Service: Gynecology;  Laterality: N/A;   rheumatoid arthritis     ROBOTIC ASSISTED TOTAL HYSTERECTOMY WITH SALPINGECTOMY Bilateral 03/12/2016   Converted to total abdominal hysterectom   SALPINGOOPHORECTOMY Right    during appendectomy.   Family History  Problem Relation Age of Onset   Hypertension Mother    Hypertension Father    Heart failure Father    Cancer Father    Breast cancer Neg Hx    Social History   Socioeconomic History   Marital status: Married    Spouse name: Not on file   Number of children: Not on file   Years of education: Not on file   Highest education level: Not on file  Occupational History   Not on file  Tobacco Use   Smoking status: Never   Smokeless tobacco: Never  Vaping Use   Vaping Use: Never used  Substance and Sexual Activity   Alcohol use: No   Drug use: No   Sexual activity: Not on file  Other Topics Concern   Not on file  Social History Narrative   Not on file   Social Determinants of Health   Financial  Resource Strain: Not on file  Food Insecurity: Not on file  Transportation Needs: Not on file  Physical Activity: Not on file  Stress: Not on file  Social Connections: Not on file     Review of Systems  Constitutional:  Negative for appetite change and unexpected weight change.  HENT:  Negative for congestion and sinus pressure.   Respiratory:  Negative for cough, chest tightness and shortness of breath.   Cardiovascular:  Negative for chest pain, palpitations and leg swelling.  Gastrointestinal:  Negative for abdominal pain, diarrhea, nausea and vomiting.  Genitourinary:  Negative for difficulty urinating and dysuria.  Musculoskeletal:  Negative for joint swelling and myalgias.  Skin:  Negative for color change and rash.  Neurological:  Negative for dizziness, light-headedness and  headaches.  Psychiatric/Behavioral:  Negative for agitation and dysphoric mood.       Objective:     BP 134/80 (BP Location: Left Arm, Patient Position: Sitting, Cuff Size: Small)    Pulse 90    Temp 98.2 F (36.8 C) (Oral)    Ht 5\' 2"  (1.575 m)    Wt 183 lb 12.8 oz (83.4 kg)    LMP 10/23/2015 Comment: bleeding since 10/23/15   SpO2 97%    BMI 33.62 kg/m  Wt Readings from Last 3 Encounters:  01/14/22 183 lb 12.8 oz (83.4 kg)  08/01/21 178 lb 3.2 oz (80.8 kg)  05/18/21 170 lb (77.1 kg)    Physical Exam Vitals reviewed.  Constitutional:      General: She is not in acute distress.    Appearance: Normal appearance.  HENT:     Head: Normocephalic and atraumatic.     Right Ear: External ear normal.     Left Ear: External ear normal.  Eyes:     General: No scleral icterus.       Right eye: No discharge.        Left eye: No discharge.     Conjunctiva/sclera: Conjunctivae normal.  Neck:     Thyroid: No thyromegaly.  Cardiovascular:     Rate and Rhythm: Normal rate and regular rhythm.  Pulmonary:     Effort: No respiratory distress.     Breath sounds: Normal breath sounds. No wheezing.  Abdominal:     General: Bowel sounds are normal.     Palpations: Abdomen is soft.     Tenderness: There is no abdominal tenderness.  Musculoskeletal:        General: No swelling or tenderness.     Cervical back: Neck supple. No tenderness.  Lymphadenopathy:     Cervical: No cervical adenopathy.  Skin:    Findings: No erythema or rash.  Neurological:     Mental Status: She is alert.  Psychiatric:        Mood and Affect: Mood normal.        Behavior: Behavior normal.     Outpatient Encounter Medications as of 01/14/2022  Medication Sig   Adalimumab 40 MG/0.8ML PNKT Inject 40 mg into the skin every 14 (fourteen) days.   aspirin 325 MG tablet Take 325 mg by mouth daily.   citalopram (CELEXA) 40 MG tablet TAKE 1 TABLET BY MOUTH EVERY DAY   folic acid (FOLVITE) 1 MG tablet Take 1 mg by  mouth daily.   methotrexate 50 MG/2ML injection Inject 1 mL as directed once a week. For 12 weeks   No facility-administered encounter medications on file as of 01/14/2022.     Lab Results  Component Value Date  WBC 6.6 09/14/2019   HGB 14.6 09/14/2019   HCT 42.8 09/14/2019   PLT 382.0 09/14/2019   GLUCOSE 88 01/14/2022   CHOL 197 01/14/2022   TRIG 115.0 01/14/2022   HDL 53.50 01/14/2022   LDLCALC 121 (H) 01/14/2022   ALT 15 09/14/2019   AST 17 09/14/2019   NA 138 01/14/2022   K 4.3 01/14/2022   CL 105 01/14/2022   CREATININE 0.69 01/14/2022   BUN 10 01/14/2022   CO2 30 01/14/2022   TSH 1.08 01/14/2022    No results found.     Assessment & Plan:   Problem List Items Addressed This Visit     Anemia - Primary    Follow cbc.       Relevant Orders   TSH (Completed)   Encounter for completion of form with patient    Form completed for work.        Rheumatoid arthritis (HCC)    Continue humira and MTX.  Doing well.  Follow.       Stress    Overall doing well with handling stress.  Doing well on citalopram.  Stable.  Continue.  Follow.       Other Visit Diagnoses     Screening cholesterol level       Relevant Orders   Basic metabolic panel (Completed)   Lipid panel (Completed)        Einar Pheasant, MD

## 2022-01-14 NOTE — Assessment & Plan Note (Signed)
Overall doing well with handling stress.  Doing well on citalopram.  Stable.  Continue.  Follow.

## 2022-01-14 NOTE — Assessment & Plan Note (Signed)
Form completed for work.

## 2022-01-16 DIAGNOSIS — M0609 Rheumatoid arthritis without rheumatoid factor, multiple sites: Secondary | ICD-10-CM | POA: Diagnosis not present

## 2022-01-16 DIAGNOSIS — Z79899 Other long term (current) drug therapy: Secondary | ICD-10-CM | POA: Diagnosis not present

## 2022-01-16 DIAGNOSIS — M159 Polyosteoarthritis, unspecified: Secondary | ICD-10-CM | POA: Diagnosis not present

## 2022-03-02 ENCOUNTER — Other Ambulatory Visit: Payer: Self-pay

## 2022-03-02 ENCOUNTER — Ambulatory Visit
Admission: RE | Admit: 2022-03-02 | Discharge: 2022-03-02 | Disposition: A | Discharge status: Home / Self Care | Payer: 59 | Source: Ambulatory Visit | Attending: Emergency Medicine | Admitting: Emergency Medicine

## 2022-03-02 VITALS — BP 123/77 | HR 73 | Temp 98.6°F | Resp 14 | Ht 62.0 in | Wt 183.9 lb

## 2022-03-02 DIAGNOSIS — J0140 Acute pansinusitis, unspecified: Secondary | ICD-10-CM

## 2022-03-02 MED ORDER — LEVOFLOXACIN 500 MG PO TABS: 500.0000 mg | ORAL_TABLET | Freq: Every day | ORAL | 0 refills | Status: DC

## 2022-03-02 MED ORDER — PROMETHAZINE-DM 6.25-15 MG/5ML PO SYRP: 5.0000 mL | ORAL_SOLUTION | Freq: Four times a day (QID) | ORAL | 0 refills | Status: DC | PRN

## 2022-03-02 MED ORDER — IPRATROPIUM BROMIDE 0.06 % NA SOLN: 2.0000 | Freq: Four times a day (QID) | NASAL | 12 refills | Status: DC

## 2022-03-02 MED ORDER — BENZONATATE 100 MG PO CAPS: 200.0000 mg | ORAL_CAPSULE | Freq: Three times a day (TID) | ORAL | 0 refills | Status: DC

## 2022-03-02 NOTE — ED Provider Notes (Signed)
?[facility] ? ? ? ?CSN: [id number] ?Arrival date & time: 03/02/22  1449 ? ? ?  ? ?History   ?Chief Complaint ?Chief Complaint  ?Patient presents with  ? Nasal Congestion  ? Sinus Problem  ? ? ?HPI ?Robin Kelly is a 56 y.o. female.  ? ?HPI ? ?72 old female here for evaluation of sinus complaints. ? ?Patient reports that for the last 3 weeks she has been experiencing sinus congestion and pressure, nasal congestion, pain in her upper gums, and postnasal drip.  This is also causing her to have a sore throat and a nonproductive cough.  She has been using Afrin for the past 7 days with mild relief of symptoms.  She denies fever, nasal discharge, or ear pain.  She does state that her ears itch. ? ?Past Medical History:  ?Diagnosis Date  ? Allergy   ? Anemia   ? Anxiety   ? due to heavy bleeding and SOB  ? Arthritis   ? Cancer [facility]) 2004  ? melanoma  ? Heavy menstrual bleeding   ? Rheumatoid arthritis ([place])   ? Seasonal allergies   ? Shortness of breath dyspnea   ? "due to anemia"  ? ? ?Patient Active Problem List  ? Diagnosis Date Noted  ? Encounter for completion of form with patient 01/14/2022  ? Breast cancer screening 08/05/2021  ? Rash 08/21/2019  ? Colon cancer screening 08/21/2019  ? Cervical cancer screening 08/21/2019  ? Stress 04/05/2019  ? Leukocytosis 12/13/2018  ? Rheumatoid arthritis ([place]) 12/13/2018  ? Elevated blood pressure reading 12/13/2018  ? Thrombocytosis 10/13/2017  ? Seronegative arthritis 05/14/2017  ? Health care maintenance 04/20/2017  ? Left knee pain 03/09/2017  ? Pain of right heel 09/15/2016  ? Anemia 03/14/2016  ? Status post abdominal hysterectomy 03/12/2016  ? Status post hysterectomy 03/12/2016  ? Menorrhagia with irregular cycle 02/06/2016  ? Endometrial polyp 02/06/2016  ? Fibroids 02/06/2016  ? ? ?Past Surgical History:  ?Procedure Laterality Date  ? ABDOMINAL HYSTERECTOMY    ? APPENDECTOMY  2001  ? right tube and ovary removed also.  ? BREAST BIOPSY Right 07/28/2019  ? [place]  bx, - BENIGN MAMMARY PARENCHYMA WITH FIBROCYSTIC   ? CESAREAN SECTION    ? x2  ? CHOLECYSTECTOMY    ? CYSTOSCOPY N/A 03/12/2016  ? Procedure: CYSTOSCOPY;  Surgeon: Will Bonnet, MD;  Location: [facility];  Service: Gynecology;  Laterality: N/A;  ? DILATATION & CURETTAGE/HYSTEROSCOPY WITH MYOSURE N/A 02/06/2016  ? Procedure: DILATATION & CURETTAGE HYSTEROSCOPY  polypectomy;  Surgeon: Will Bonnet, MD;  Location: [facility];  Service: Gynecology;  Laterality: N/A;  ? POLYPECTOMY N/A 02/06/2016  ? Procedure: POLYPECTOMY;  Surgeon: Will Bonnet, MD;  Location: [facility];  Service: Gynecology;  Laterality: N/A;  ? rheumatoid arthritis    ? ROBOTIC ASSISTED TOTAL HYSTERECTOMY WITH SALPINGECTOMY Bilateral 03/12/2016  ? Converted to total abdominal hysterectom  ? SALPINGOOPHORECTOMY Right   ? during appendectomy.  ? ? ?OB History   ?No obstetric history on file. ?  ? ? ? ?Home Medications   ? ?Prior to Admission medications   ?Medication Sig Start Date End Date Taking? Authorizing Provider  ?Adalimumab 40 MG/0.8ML PNKT Inject 40 mg into the skin every 14 (fourteen) days.   Yes [provider]  ?aspirin 325 MG tablet Take 325 mg by mouth daily.   Yes [provider]  ?benzonatate (TESSALON) 100 MG capsule Take 2 capsules (200 mg total) by mouth every  8 (eight) hours. 03/02/22  Yes Margarette Canada, NP  ?citalopram (CELEXA) 40 MG tablet TAKE 1 TABLET BY MOUTH EVERY DAY 12/11/21  Yes Einar Pheasant, MD  ?folic acid (FOLVITE) 1 MG tablet Take 1 mg by mouth daily.   Yes [provider]  ?ipratropium (ATROVENT) 0.06 % nasal spray Place 2 sprays into both nostrils 4 (four) times daily. 03/02/22  Yes Margarette Canada, NP  ?levofloxacin (LEVAQUIN) 500 MG tablet Take 1 tablet (500 mg total) by mouth daily. 03/02/22  Yes Margarette Canada, NP  ?promethazine-dextromethorphan (PROMETHAZINE-DM) 6.25-15 MG/5ML syrup Take 5 mLs by mouth 4 (four) times daily as needed. 03/02/22  Yes Margarette Canada, NP  ? ? ?Family History ?Family  History  ?Problem Relation Age of Onset  ? Hypertension Mother   ? Hypertension Father   ? Heart failure Father   ? Cancer Father   ? Breast cancer Neg Hx   ? ? ?Social History ?Social History  ? ?Tobacco Use  ? Smoking status: Never  ? Smokeless tobacco: Never  ?Vaping Use  ? Vaping Use: Never used  ?Substance Use Topics  ? Alcohol use: No  ? Drug use: No  ? ? ? ?Allergies   ?Penicillins and Oxycodone ? ? ?Review of Systems ?Review of Systems  ?Constitutional:  Negative for fever.  ?HENT:  Positive for congestion, postnasal drip, sinus pressure and sore throat. Negative for ear discharge, ear pain and rhinorrhea.   ?Respiratory:  Positive for cough. Negative for shortness of breath and wheezing.   ?Hematological: Negative.   ?Psychiatric/Behavioral: Negative.    ? ? ?Physical Exam ?Triage Vital Signs ?ED Triage Vitals  ?Enc Vitals Group  ?   BP 03/02/22 1506 123/77  ?   Pulse Rate 03/02/22 1506 73  ?   Resp 03/02/22 1506 14  ?   Temp 03/02/22 1506 98.6 ?F (37 ?C)  ?   Temp Source 03/02/22 1506 Oral  ?   SpO2 03/02/22 1506 100 %  ?   Weight 03/02/22 1500 183 lb 13.8 oz (83.4 kg)  ?   Height 03/02/22 1500 '5\' 2"'$  (1.575 m)  ?   Head Circumference --   ?   Peak Flow --   ?   Pain Score 03/02/22 1459 5  ?   Pain Loc --   ?   Pain Edu? --   ?   Excl. in [place]? --   ? ?No data found. ? ?Updated Vital Signs ?BP 123/77 (BP Location: Left Arm)   Pulse 73   Temp 98.6 ?F (37 ?C) (Oral)   Resp 14   Ht '5\' 2"'$  (1.575 m)   Wt 183 lb 13.8 oz (83.4 kg)   LMP 10/23/2015 Comment: bleeding since 10/23/15  SpO2 100%   BMI 33.63 kg/m?  ? ?Visual Acuity ?Right Eye Distance:   ?Left Eye Distance:   ?Bilateral Distance:   ? ?Right Eye Near:   ?Left Eye Near:    ?Bilateral Near:    ? ?Physical Exam ?Vitals and nursing note reviewed.  ?Constitutional:   ?   Appearance: Normal appearance. She is not ill-appearing.  ?HENT:  ?   Head: Normocephalic and atraumatic.  ?   Right Ear: Tympanic membrane, ear canal and external ear normal. There is  no impacted cerumen.  ?   Left Ear: Tympanic membrane, ear canal and external ear normal. There is no impacted cerumen.  ?   Nose: Congestion and rhinorrhea present.  ?   Mouth/Throat:  ?   Mouth:  Mucous membranes are moist.  ?   Pharynx: Oropharynx is clear. No posterior oropharyngeal erythema.  ?Cardiovascular:  ?   Rate and Rhythm: Normal rate and regular rhythm.  ?   Pulses: Normal pulses.  ?   Heart sounds: Normal heart sounds. No murmur heard. ?  No friction rub. No gallop.  ?Pulmonary:  ?   Effort: Pulmonary effort is normal.  ?   Breath sounds: Normal breath sounds. No wheezing, rhonchi or rales.  ?Musculoskeletal:  ?   Cervical back: Normal range of motion and neck supple.  ?Lymphadenopathy:  ?   Cervical: No cervical adenopathy.  ?Skin: ?   General: Skin is warm and dry.  ?   Capillary Refill: Capillary refill takes less than 2 seconds.  ?   Findings: No erythema or rash.  ?Neurological:  ?   General: No focal deficit present.  ?   Mental Status: She is alert and oriented to person, place, and time.  ?Psychiatric:     ?   Mood and Affect: Mood normal.     ?   Behavior: Behavior normal.     ?   Thought Content: Thought content normal.     ?   Judgment: Judgment normal.  ? ? ? ?UC Treatments / Results  ?Labs ?(all labs ordered are listed, but only abnormal results are displayed) ?Labs Reviewed - No data to display ? ?EKG ? ? ?Radiology ?No results found. ? ?Procedures ?Procedures (including critical care time) ? ?Medications Ordered in UC ?Medications - No data to display ? ?Initial Impression / Assessment and Plan / UC Course  ?I have reviewed the triage vital signs and the nursing notes. ? ?Pertinent labs & imaging results that were available during my care of the patient were reviewed by me and considered in my medical decision making (see chart for details). ? ?Patient is a very pleasant, nontoxic-appearing 55 old female here for evaluation of sinus complaints as outlined in HPI above.  On exam patient  has pearly-gray tympanic membranes bilaterally with normal light reflex and clear external auditory canals.  Nasal mucosa is erythematous and edematous.  Patient does have marked tenderness to frontal and maxi

## 2022-03-02 NOTE — Discharge Instructions (Signed)
The Levaquin daily with food for 7 days for treatment of your sinusitis. ? ?Perform sinus irrigation 2-3 times a day with a [employer]Med sinus rinse kit and distilled water.  Do not use tap water. ? ?You can use plain over-the-counter Mucinex every 6 hours to break up the stickiness of the mucus so your body can clear it. ? ?Increase your oral fluid intake to thin out your mucus so that is also able for your body to clear more easily. ? ?Take an over-the-counter probiotic, such as Culturelle-align-activia, 1 hour after each dose of antibiotic to prevent diarrhea. ? ?Use the Atrovent nasal spray, 2 squirts in each nostril every 6 hours, as needed for runny nose and postnasal drip. ? ?Use the Tessalon Perles every 8 hours during the day.  Take them with a small sip of water.  They may give you some numbness to the base of your tongue or a metallic taste in your mouth, this is normal. ? ?Use the Promethazine DM cough syrup at bedtime for cough and congestion.  It will make you drowsy so do not take it during the day.  ? ?If you develop any new or worsening symptoms return for reevaluation or see your primary care provider.  ?

## 2022-03-02 NOTE — ED Triage Notes (Signed)
Patient c/o sinus congestion and pressure and gum pain, nasal congestion and post nasal drip for about 3 weeks.  Patient states that the Zyrtec is not helping her symptoms.  Patient denies fevers.  ?

## 2022-04-19 ENCOUNTER — Ambulatory Visit: Payer: 59 | Admitting: Internal Medicine

## 2022-05-07 ENCOUNTER — Other Ambulatory Visit: Payer: Self-pay | Admitting: Internal Medicine

## 2022-05-14 ENCOUNTER — Ambulatory Visit: Payer: 59 | Admitting: Dermatology

## 2022-05-14 DIAGNOSIS — D692 Other nonthrombocytopenic purpura: Secondary | ICD-10-CM | POA: Diagnosis not present

## 2022-05-14 NOTE — Patient Instructions (Addendum)
Due to recent changes in healthcare laws, you may see results of your pathology and/or laboratory studies on MyChart before the doctors have had a chance to review them. We understand that in some cases there may be results that are confusing or concerning to you. Please understand that not all results are received at the same time and often the doctors may need to interpret multiple results in order to provide you with the best plan of care or course of treatment. Therefore, we ask that you please give us 2 business days to thoroughly review all your results before contacting the office for clarification. Should we see a critical lab result, you will be contacted sooner.   If You Need Anything After Your Visit  If you have any questions or concerns for your doctor, please call our main line at [phone] and press option 4 to reach your doctor's medical assistant. If no one answers, please leave a voicemail as directed and we will return your call as soon as possible. Messages left after 4 pm will be answered the following business day.   You may also send us a message via MyChart. We typically respond to MyChart messages within 1-2 business days.  For prescription refills, please ask your pharmacy to contact our office. Our fax number is [phone].  If you have an urgent issue when the clinic is closed that cannot wait until the next business day, you can page your doctor at the number below.    Please note that while we do our best to be available for urgent issues outside of office hours, we are not available 24/7.   If you have an urgent issue and are unable to reach us, you may choose to seek medical care at your doctor's office, retail clinic, urgent care center, or emergency room.  If you have a medical emergency, please immediately call 911 or go to the emergency department.  Pager Numbers  - Dr. Kowalski: [phone]  - Dr. Moye: [phone]  - Dr. Stewart:  [phone]  In the event of inclement weather, please call our main line at [phone] for an update on the status of any delays or closures.  Dermatology Medication Tips: Please keep the boxes that topical medications come in in order to help keep track of the instructions about where and how to use these. Pharmacies typically print the medication instructions only on the boxes and not directly on the medication tubes.   If your medication is too expensive, please contact our office at [phone] option 4 or send us a message through MyChart.   We are unable to tell what your co-pay for medications will be in advance as this is different depending on your insurance coverage. However, we may be able to find a substitute medication at lower cost or fill out paperwork to get insurance to cover a needed medication.   If a prior authorization is required to get your medication covered by your insurance company, please allow us 1-2 business days to complete this process.  Drug prices often vary depending on where the prescription is filled and some pharmacies may offer cheaper prices.  The website www.goodrx.com contains coupons for medications through different pharmacies. The prices here do not account for what the cost may be with help from insurance (it may be cheaper with your insurance), but the website can give you the price if you did not use any insurance.  - You can print the associated coupon and take it with   your prescription to the pharmacy.  - You may also stop by our office during regular business hours and pick up a GoodRx coupon card.  - If you need your prescription sent electronically to a different pharmacy, notify our office through [place] MyChart or by phone at [phone] option 4.     Si Usted Necesita Algo Despus de Su Visita  Tambin puede enviarnos un mensaje a travs de MyChart. Por lo general respondemos a los mensajes de MyChart en el transcurso de 1 a 2  das hbiles.  Para renovar recetas, por favor pida a su farmacia que se ponga en contacto con nuestra oficina. Nuestro nmero de fax es el [phone].  Si tiene un asunto urgente cuando la clnica est cerrada y que no puede esperar hasta el siguiente da hbil, puede llamar/localizar a su doctor(a) al nmero que aparece a continuacin.   Por favor, tenga en cuenta que aunque hacemos todo lo posible para estar disponibles para asuntos urgentes fuera del horario de oficina, no estamos disponibles las 24 horas del da, los 7 das de la semana.   Si tiene un problema urgente y no puede comunicarse con nosotros, puede optar por buscar atencin mdica  en el consultorio de su doctor(a), en una clnica privada, en un centro de atencin urgente o en una sala de emergencias.  Si tiene una emergencia mdica, por favor llame inmediatamente al 911 o vaya a la sala de emergencias.  Nmeros de bper  - Dr. Kowalski: [phone]  - Dra. Moye: [phone]  - Dra. Stewart: [phone]  En caso de inclemencias del tiempo, por favor llame a nuestra lnea principal al [phone] para una actualizacin sobre el estado de cualquier retraso o cierre.  Consejos para la medicacin en dermatologa: Por favor, guarde las cajas en las que vienen los medicamentos de uso tpico para ayudarle a seguir las instrucciones sobre dnde y cmo usarlos. Las farmacias generalmente imprimen las instrucciones del medicamento slo en las cajas y no directamente en los tubos del medicamento.   Si su medicamento es muy caro, por favor, pngase en contacto con nuestra oficina llamando al [phone] y presione la opcin 4 o envenos un mensaje a travs de MyChart.   No podemos decirle cul ser su copago por los medicamentos por adelantado ya que esto es diferente dependiendo de la cobertura de su seguro. Sin embargo, es posible que podamos encontrar un medicamento sustituto a menor costo o llenar un formulario para que el  seguro cubra el medicamento que se considera necesario.   Si se requiere una autorizacin previa para que su compaa de seguros cubra su medicamento, por favor permtanos de 1 a 2 das hbiles para completar este proceso.  Los precios de los medicamentos varan con frecuencia dependiendo del lugar de dnde se surte la receta y alguna farmacias pueden ofrecer precios ms baratos.  El sitio web www.goodrx.com tiene cupones para medicamentos de diferentes farmacias. Los precios aqu no tienen en cuenta lo que podra costar con la ayuda del seguro (puede ser ms barato con su seguro), pero el sitio web puede darle el precio si no utiliz ningn seguro.  - Puede imprimir el cupn correspondiente y llevarlo con su receta a la farmacia.  - Tambin puede pasar por nuestra oficina durante el horario de atencin regular y recoger una tarjeta de cupones de GoodRx.  - Si necesita que su receta se enve electrnicamente a una farmacia diferente, informe a nuestra oficina a travs de MyChart de [place]   o por telfono llamando al [phone] y presione la opcin 4.  

## 2022-05-14 NOTE — Progress Notes (Signed)
   Follow-Up Visit   Subjective  Robin Kelly is a 56 y.o. female who presents for the following: Other (Patient noticed a spot under her right thumb nail a 2 weeks ago. Patient denies any pain. ).  The patient has spots, moles and lesions to be evaluated, some may be new or changing and the patient has concerns that these could be cancer.   The following portions of the chart were reviewed this encounter and updated as appropriate:      Review of Systems: No other skin or systemic complaints except as noted in HPI or Assessment and Plan.   Objective  Well appearing patient in no apparent distress; mood and affect are within normal limits.  A focused examination was performed including right thumbnail. Relevant physical exam findings are noted in the Assessment and Plan.  Right Thumb Nail Plate 2.0 mm violaceous brown macule regular borders with adjacent whitish  nail dystrophy, pigment not connected to proximal nail fold          Assessment & Plan  Other nonthrombocytopenic purpura (HCC) Right Thumb Nail Plate  Likely due to trauma. Benign-appearing.  Observation.   Discoloration should eventually grow out or fade over time. If any changes or if this area start to enlarge, return to the office.   ABCDEs of mole observation discussed.  RTC if any changes noted.  Discussed photoprotection and regular use of broad-spectrum spf 30+ sunscreen.  Staying in the shade or wearing long sleeves, sun glasses (UVA+UVB protection) and wide brim hats (4-inch brim around the entire circumference of the hat) are also recommended for sun protection.     Return for TBSE  as scheduled in Feb with Dr Dara Lords, Marye Round, CMA, am acting as scribe for Brendolyn Patty, MD .   Documentation: I have reviewed the above documentation for accuracy and completeness, and I agree with the above.  Brendolyn Patty MD

## 2022-05-16 DIAGNOSIS — M159 Polyosteoarthritis, unspecified: Secondary | ICD-10-CM | POA: Diagnosis not present

## 2022-05-16 DIAGNOSIS — Z79899 Other long term (current) drug therapy: Secondary | ICD-10-CM | POA: Diagnosis not present

## 2022-05-16 DIAGNOSIS — J0100 Acute maxillary sinusitis, unspecified: Secondary | ICD-10-CM | POA: Diagnosis not present

## 2022-05-16 DIAGNOSIS — M0609 Rheumatoid arthritis without rheumatoid factor, multiple sites: Secondary | ICD-10-CM | POA: Diagnosis not present

## 2022-05-21 ENCOUNTER — Ambulatory Visit (INDEPENDENT_AMBULATORY_CARE_PROVIDER_SITE_OTHER): Payer: 59 | Admitting: Internal Medicine

## 2022-05-21 ENCOUNTER — Encounter: Payer: Self-pay | Admitting: Internal Medicine

## 2022-05-21 DIAGNOSIS — N760 Acute vaginitis: Secondary | ICD-10-CM | POA: Diagnosis not present

## 2022-05-21 DIAGNOSIS — D649 Anemia, unspecified: Secondary | ICD-10-CM

## 2022-05-21 DIAGNOSIS — M069 Rheumatoid arthritis, unspecified: Secondary | ICD-10-CM

## 2022-05-21 DIAGNOSIS — Z1231 Encounter for screening mammogram for malignant neoplasm of breast: Secondary | ICD-10-CM

## 2022-05-21 DIAGNOSIS — R69 Illness, unspecified: Secondary | ICD-10-CM | POA: Diagnosis not present

## 2022-05-21 DIAGNOSIS — Z1211 Encounter for screening for malignant neoplasm of colon: Secondary | ICD-10-CM

## 2022-05-21 DIAGNOSIS — F439 Reaction to severe stress, unspecified: Secondary | ICD-10-CM

## 2022-05-21 MED ORDER — METFORMIN HCL ER 500 MG PO TB24: 500.0000 mg | ORAL_TABLET | Freq: Every day | ORAL | 3 refills | Status: DC

## 2022-05-21 MED ORDER — FLUCONAZOLE 150 MG PO TABS: ORAL_TABLET | ORAL | 0 refills | Status: DC

## 2022-05-21 NOTE — Progress Notes (Signed)
Patient ID: Robin Kelly, female   DOB: 19-Jul-1966, 56 y.o.   MRN: [id number]   Subjective:    Patient ID: Robin Kelly, female    DOB: [birth year], 56 y.o.   MRN: [id number]   Patient here for a scheduled follow up.   Chief Complaint  Patient presents with   Follow-up    3 Month Follow up   .   HPI Here to follow up regarding her arthritis and increased stress.  Sees Dr Posey Pronto for her RA.  Taking MTX and humira. Was given course of prednisone recently.  Was also instructed could use voltaren gel for OA.  Prescribed doxycycline for sinus infection.  States sinus symptoms have improved, but she is having some vaginal itching from taking the abx.  Feels similar to yeast infection.  No chest pain or sob reported.  No cough or congestion.  No abdominal pain.  Bowels moving.  Discussed mammogram.     Past Medical History:  Diagnosis Date   Allergy    Anemia    Anxiety    due to heavy bleeding and SOB   Arthritis    Cancer ([place]) 2004   melanoma   Heavy menstrual bleeding    Rheumatoid arthritis (HCC)    Seasonal allergies    Shortness of breath dyspnea    "due to anemia"   Past Surgical History:  Procedure Laterality Date   ABDOMINAL HYSTERECTOMY     APPENDECTOMY  2001   right tube and ovary removed also.   BREAST BIOPSY Right 07/28/2019   [place] bx, - BENIGN MAMMARY PARENCHYMA WITH FIBROCYSTIC    CESAREAN SECTION     x2   CHOLECYSTECTOMY     CYSTOSCOPY N/A 03/12/2016   Procedure: CYSTOSCOPY;  Surgeon: Will Bonnet, MD;  Location: [facility];  Service: Gynecology;  Laterality: N/A;   DILATATION & CURETTAGE/HYSTEROSCOPY WITH MYOSURE N/A 02/06/2016   Procedure: DILATATION & CURETTAGE HYSTEROSCOPY  polypectomy;  Surgeon: Will Bonnet, MD;  Location: [facility];  Service: Gynecology;  Laterality: N/A;   POLYPECTOMY N/A 02/06/2016   Procedure: POLYPECTOMY;  Surgeon: Will Bonnet, MD;  Location: [facility];  Service: Gynecology;  Laterality: N/A;   rheumatoid arthritis      ROBOTIC ASSISTED TOTAL HYSTERECTOMY WITH SALPINGECTOMY Bilateral 03/12/2016   Converted to total abdominal hysterectom   SALPINGOOPHORECTOMY Right    during appendectomy.   Family History  Problem Relation Age of Onset   Hypertension Mother    Hypertension Father    Heart failure Father    Cancer Father    Breast cancer Neg Hx    Social History   Socioeconomic History   Marital status: Married    Spouse name: Not on file   Number of children: Not on file   Years of education: Not on file   Highest education level: Not on file  Occupational History   Not on file  Tobacco Use   Smoking status: Never   Smokeless tobacco: Never  Vaping Use   Vaping Use: Never used  Substance and Sexual Activity   Alcohol use: No   Drug use: No   Sexual activity: Not on file  Other Topics Concern   Not on file  Social History Narrative   Not on file   Social Determinants of Health   Financial Resource Strain: Not on file  Food Insecurity: Not on file  Transportation Needs: Not on file  Physical Activity: Not on file  Stress: Not on file  Social  Connections: Not on file     Review of Systems  Constitutional:  Negative for appetite change and unexpected weight change.  HENT:  Negative for congestion and sinus pressure.   Respiratory:  Negative for cough, chest tightness and shortness of breath.   Cardiovascular:  Negative for chest pain, palpitations and leg swelling.  Gastrointestinal:  Negative for abdominal pain, diarrhea, nausea and vomiting.  Genitourinary:  Negative for difficulty urinating and dysuria.  Musculoskeletal:  Negative for myalgias.       Intermittent joint flares - with RA - as outlined.   Skin:  Negative for color change and rash.  Neurological:  Negative for dizziness, light-headedness and headaches.  Psychiatric/Behavioral:  Negative for agitation and dysphoric mood.        Objective:     BP 120/68 (BP Location: Left Arm, Patient Position: Sitting, Cuff  Size: Normal)   Pulse 87   Temp 98.1 F (36.7 C) (Oral)   Resp (!) 21   Ht '5\' 2"'$  (1.575 m)   Wt 189 lb 6.4 oz (85.9 kg)   LMP 10/23/2015 Comment: bleeding since 10/23/15  SpO2 94%   BMI 34.64 kg/m  Wt Readings from Last 3 Encounters:  05/21/22 189 lb 6.4 oz (85.9 kg)  03/02/22 183 lb 13.8 oz (83.4 kg)  01/14/22 183 lb 12.8 oz (83.4 kg)    Physical Exam Vitals reviewed.  Constitutional:      General: She is not in acute distress.    Appearance: Normal appearance.  HENT:     Head: Normocephalic and atraumatic.     Right Ear: External ear normal.     Left Ear: External ear normal.  Eyes:     General: No scleral icterus.       Right eye: No discharge.        Left eye: No discharge.     Conjunctiva/sclera: Conjunctivae normal.  Neck:     Thyroid: No thyromegaly.  Cardiovascular:     Rate and Rhythm: Normal rate and regular rhythm.  Pulmonary:     Effort: No respiratory distress.     Breath sounds: Normal breath sounds. No wheezing.  Abdominal:     General: Bowel sounds are normal.     Palpations: Abdomen is soft.     Tenderness: There is no abdominal tenderness.  Musculoskeletal:        General: No swelling or tenderness.     Cervical back: Neck supple. No tenderness.  Lymphadenopathy:     Cervical: No cervical adenopathy.  Skin:    Findings: No erythema or rash.  Neurological:     Mental Status: She is alert.  Psychiatric:        Mood and Affect: Mood normal.        Behavior: Behavior normal.      Outpatient Encounter Medications as of 05/21/2022  Medication Sig   Adalimumab 40 MG/0.8ML PNKT Inject 40 mg into the skin every 14 (fourteen) days.   aspirin 325 MG tablet Take 325 mg by mouth daily.   benzonatate (TESSALON) 100 MG capsule Take 2 capsules (200 mg total) by mouth every 8 (eight) hours.   citalopram (CELEXA) 40 MG tablet TAKE 1 TABLET BY MOUTH EVERY DAY   fluconazole (DIFLUCAN) 150 MG tablet Take 1 tablet x 1 day.  If persistent symptoms after three  days then may repeat x 1   folic acid (FOLVITE) 1 MG tablet Take 1 mg by mouth daily.   ipratropium (ATROVENT) 0.06 % nasal spray Place 2  sprays into both nostrils 4 (four) times daily.   levofloxacin (LEVAQUIN) 500 MG tablet Take 1 tablet (500 mg total) by mouth daily.   metFORMIN (GLUCOPHAGE-XR) 500 MG 24 hr tablet Take 1 tablet (500 mg total) by mouth daily with breakfast.   promethazine-dextromethorphan (PROMETHAZINE-DM) 6.25-15 MG/5ML syrup Take 5 mLs by mouth 4 (four) times daily as needed.   No facility-administered encounter medications on file as of 05/21/2022.     Lab Results  Component Value Date   WBC 6.6 09/14/2019   HGB 14.6 09/14/2019   HCT 42.8 09/14/2019   PLT 382.0 09/14/2019   GLUCOSE 88 01/14/2022   CHOL 197 01/14/2022   TRIG 115.0 01/14/2022   HDL 53.50 01/14/2022   LDLCALC 121 (H) 01/14/2022   ALT 15 09/14/2019   AST 17 09/14/2019   NA 138 01/14/2022   K 4.3 01/14/2022   CL 105 01/14/2022   CREATININE 0.69 01/14/2022   BUN 10 01/14/2022   CO2 30 01/14/2022   TSH 1.08 01/14/2022       Assessment & Plan:   Problem List Items Addressed This Visit     Anemia    Follow cbc.       Breast cancer screening    Due mammogram.  Needs appt after 3:00.        Colon cancer screening    cologuard 10/2020 - negative.       Rheumatoid arthritis (HCC)    Continue humira and MTX.  Doing well.  Overall relatively stable. Follow.       Stress    Overall appears to be handling things relatively well.  Doing well on citalopram.  Follow.       Vaginitis    Symptoms c/w yeast infection.  Recent abx.  Discussed evaluation.  dilfucan as directed.  Call with update.          Einar Pheasant, MD

## 2022-05-26 ENCOUNTER — Telehealth: Payer: Self-pay | Admitting: Internal Medicine

## 2022-05-26 ENCOUNTER — Encounter: Payer: Self-pay | Admitting: Internal Medicine

## 2022-05-26 DIAGNOSIS — N760 Acute vaginitis: Secondary | ICD-10-CM | POA: Insufficient documentation

## 2022-05-26 NOTE — Assessment & Plan Note (Signed)
Symptoms c/w yeast infection.  Recent abx.  Discussed evaluation.  dilfucan as directed.  Call with update.

## 2022-05-26 NOTE — Assessment & Plan Note (Signed)
Follow cbc.  

## 2022-05-26 NOTE — Assessment & Plan Note (Signed)
Continue humira and MTX.  Doing well.  Overall relatively stable. Follow.

## 2022-05-26 NOTE — Assessment & Plan Note (Signed)
cologuard 10/2020 - negative.

## 2022-05-26 NOTE — Assessment & Plan Note (Signed)
Overall appears to be handling things relatively well.  Doing well on citalopram.  Follow.

## 2022-05-26 NOTE — Assessment & Plan Note (Signed)
Due mammogram.  Needs appt after 3:00.

## 2022-05-26 NOTE — Telephone Encounter (Signed)
Needs mammogram scheduled.  Needs after 3:00.  Order in.

## 2022-06-24 ENCOUNTER — Telehealth: Payer: Self-pay | Admitting: Internal Medicine

## 2022-06-24 NOTE — Telephone Encounter (Signed)
Received notice overdue mammogram.  Need to schedule.

## 2022-06-25 ENCOUNTER — Other Ambulatory Visit: Payer: Self-pay | Admitting: Internal Medicine

## 2022-06-25 DIAGNOSIS — Z1231 Encounter for screening mammogram for malignant neoplasm of breast: Secondary | ICD-10-CM

## 2022-06-25 NOTE — Telephone Encounter (Signed)
Lm for pt to cb re : Mammo has been scheduled at [facility] for 8/25 at 120pm

## 2022-06-25 NOTE — Telephone Encounter (Signed)
Pt returning call.... Advised pt of below note... Pt understood...  ?

## 2022-07-19 ENCOUNTER — Ambulatory Visit
Admission: RE | Admit: 2022-07-19 | Discharge: 2022-07-19 | Disposition: A | Discharge status: Home / Self Care | Payer: 59 | Source: Ambulatory Visit | Attending: Internal Medicine | Admitting: Internal Medicine

## 2022-07-19 DIAGNOSIS — Z1231 Encounter for screening mammogram for malignant neoplasm of breast: Secondary | ICD-10-CM | POA: Diagnosis not present

## 2022-07-25 ENCOUNTER — Telehealth: Payer: Self-pay | Admitting: Internal Medicine

## 2022-07-25 DIAGNOSIS — E7800 Pure hypercholesterolemia, unspecified: Secondary | ICD-10-CM

## 2022-07-25 DIAGNOSIS — M069 Rheumatoid arthritis, unspecified: Secondary | ICD-10-CM

## 2022-07-25 DIAGNOSIS — E1165 Type 2 diabetes mellitus with hyperglycemia: Secondary | ICD-10-CM

## 2022-07-25 NOTE — Telephone Encounter (Signed)
Patient has a lab appt 07/30/2022, there are no orders in.

## 2022-07-26 NOTE — Telephone Encounter (Signed)
Orders placed for labs

## 2022-07-26 NOTE — Addendum Note (Signed)
Addended by: Alisa Graff on: 07/26/2022 03:32 AM   Modules accepted: Orders

## 2022-07-30 ENCOUNTER — Other Ambulatory Visit: Payer: 59

## 2022-07-30 ENCOUNTER — Ambulatory Visit: Payer: 59 | Admitting: Internal Medicine

## 2022-08-01 ENCOUNTER — Ambulatory Visit: Payer: 59 | Admitting: Internal Medicine

## 2022-08-05 ENCOUNTER — Ambulatory Visit: Payer: 59 | Admitting: Internal Medicine

## 2022-08-10 ENCOUNTER — Ambulatory Visit
Admission: RE | Admit: 2022-08-10 | Discharge: 2022-08-10 | Disposition: A | Discharge status: Home / Self Care | Payer: 59 | Source: Ambulatory Visit | Attending: Emergency Medicine | Admitting: Emergency Medicine

## 2022-08-10 VITALS — BP 152/90 | HR 78 | Temp 98.6°F | Resp 14 | Ht 62.0 in | Wt 189.4 lb

## 2022-08-10 DIAGNOSIS — J019 Acute sinusitis, unspecified: Secondary | ICD-10-CM

## 2022-08-10 MED ORDER — CEFDINIR 300 MG PO CAPS: 300.0000 mg | ORAL_CAPSULE | Freq: Two times a day (BID) | ORAL | 0 refills | Status: AC

## 2022-08-10 NOTE — ED Triage Notes (Signed)
Patient sinus congestion and drainage for the past 2 weeks.  Patient has been taking Zyrtec daily.  Patient reports sinus pain and green drainage that started on Wed.  Patient denies fevers.

## 2022-08-10 NOTE — ED Provider Notes (Signed)
[facility] - [place], [place]   Name: Robin Kelly DOB: 1965/12/31 MRN: [id number] CSN: [id number] PCP: Einar Pheasant, MD  Arrival date and time:  08/10/22 1200  Chief Complaint:  Sinus Problem (Appointment)   NOTE: Prior to seeing the patient today, I have reviewed the triage nursing documentation and vital signs. Clinical staff has updated patient's PMH/PSHx, current medication list, and drug allergies/intolerances to ensure comprehensive history available to assist in medical decision making.   History:   HPI: Robin Kelly is a 56 y.o. female who presents today with complaints of sinus pressure and congestion.  Patient's the symptoms started approximately 2 weeks ago.  She treated the symptoms with over-the-counter Flonase and Zyrtec but they started to worsen over the past 72 hours.  She noticed increased fatigue, discoloration of her drainage and pain to bilateral sinuses.  She denies any recorded fevers, chills or body aches.  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  No recent antibiotics.   Past Medical History:  Diagnosis Date   Allergy    Anemia    Anxiety    due to heavy bleeding and SOB   Arthritis    Cancer ([place]) 2004   melanoma   Heavy menstrual bleeding    Rheumatoid arthritis (HCC)    Seasonal allergies    Shortness of breath dyspnea    "due to anemia"    Past Surgical History:  Procedure Laterality Date   ABDOMINAL HYSTERECTOMY     APPENDECTOMY  2001   right tube and ovary removed also.   BREAST BIOPSY Right 07/28/2019   [place] bx, - BENIGN MAMMARY PARENCHYMA WITH FIBROCYSTIC    CESAREAN SECTION     x2   CHOLECYSTECTOMY     CYSTOSCOPY N/A 03/12/2016   Procedure: CYSTOSCOPY;  Surgeon: Will Bonnet, MD;  Location: [facility];  Service: Gynecology;  Laterality: N/A;   DILATATION & CURETTAGE/HYSTEROSCOPY WITH MYOSURE N/A 02/06/2016   Procedure: DILATATION & CURETTAGE HYSTEROSCOPY  polypectomy;   Surgeon: Will Bonnet, MD;  Location: [facility];  Service: Gynecology;  Laterality: N/A;   POLYPECTOMY N/A 02/06/2016   Procedure: POLYPECTOMY;  Surgeon: Will Bonnet, MD;  Location: [facility];  Service: Gynecology;  Laterality: N/A;   rheumatoid arthritis     ROBOTIC ASSISTED TOTAL HYSTERECTOMY WITH SALPINGECTOMY Bilateral 03/12/2016   Converted to total abdominal hysterectom   SALPINGOOPHORECTOMY Right    during appendectomy.    Family History  Problem Relation Age of Onset   Hypertension Mother    Hypertension Father    Heart failure Father    Cancer Father    Breast cancer Neg Hx     Social History   Tobacco Use   Smoking status: Never   Smokeless tobacco: Never  Vaping Use   Vaping Use: Never used  Substance Use Topics   Alcohol use: No   Drug use: No    Patient Active Problem List   Diagnosis Date Noted   Vaginitis 05/26/2022   Encounter for completion of form with patient 01/14/2022   Breast cancer screening 08/05/2021   Rash 08/21/2019   Colon cancer screening 08/21/2019   Cervical cancer screening 08/21/2019   Stress 04/05/2019   Leukocytosis 12/13/2018   Rheumatoid arthritis ([place]) 12/13/2018   Elevated blood pressure reading 12/13/2018   Thrombocytosis 10/13/2017   Seronegative arthritis 05/14/2017   Health care maintenance 04/20/2017   Left knee pain 03/09/2017   Pain of right  heel 09/15/2016   Anemia 03/14/2016   Status post abdominal hysterectomy 03/12/2016   Status post hysterectomy 03/12/2016   Menorrhagia with irregular cycle 02/06/2016   Endometrial polyp 02/06/2016   Fibroids 02/06/2016    Home Medications:    Current Meds  Medication Sig   cefdinir (OMNICEF) 300 MG capsule Take 1 capsule (300 mg total) by mouth 2 (two) times daily for 7 days.    Allergies:   Penicillins and Oxycodone  Review of Systems (ROS): Review of Systems  Constitutional:  Positive for fatigue. Negative for activity change, appetite change, diaphoresis  and fever.  HENT:  Positive for congestion, postnasal drip, sinus pressure and sinus pain. Negative for ear discharge, ear pain, facial swelling, hearing loss, sneezing and sore throat.   Eyes:  Negative for pain.  Respiratory:  Positive for cough. Negative for shortness of breath and wheezing.   Gastrointestinal:  Negative for constipation, nausea and vomiting.  Musculoskeletal:  Negative for myalgias.  All other systems reviewed and are negative.    Vital Signs: Today's Vitals   08/10/22 1217 08/10/22 1218 08/10/22 1219 08/10/22 1259  BP:   (!) 152/90   Pulse:   78   Resp:   14   Temp:   98.6 F (37 C)   TempSrc:   Oral   SpO2:   100%   Weight:  189 lb 6 oz (85.9 kg)    Height:  '5\' 2"'$  (1.575 m)    PainSc: 5    5     Physical Exam: Physical Exam Vitals and nursing note reviewed.  Constitutional:      Appearance: Normal appearance.  HENT:     Right Ear: Tympanic membrane normal.     Left Ear: Tympanic membrane normal.     Nose:     Right Sinus: Maxillary sinus tenderness present.     Left Sinus: Maxillary sinus tenderness present.     Mouth/Throat:     Pharynx: Oropharynx is clear. Posterior oropharyngeal erythema present.     Tonsils: No tonsillar exudate.  Cardiovascular:     Rate and Rhythm: Normal rate and regular rhythm.     Pulses: Normal pulses.     Heart sounds: Normal heart sounds.  Pulmonary:     Effort: Pulmonary effort is normal.     Breath sounds: Normal breath sounds.  Skin:    General: Skin is warm and dry.  Neurological:     General: No focal deficit present.     Mental Status: She is alert and oriented to person, place, and time.  Psychiatric:        Mood and Affect: Mood normal.        Behavior: Behavior normal.      Urgent Care Treatments / Results:   LABS: PLEASE NOTE: all labs that were ordered this encounter are listed, however only abnormal results are displayed. Labs Reviewed - No data to display  EKG: -None  RADIOLOGY: No  results found.  PROCEDURES: Procedures  MEDICATIONS RECEIVED THIS VISIT: Medications - No data to display  PERTINENT CLINICAL COURSE NOTES/UPDATES:   Initial Impression / Assessment and Plan / Urgent Care Course:  Pertinent labs & imaging results that were available during my care of the patient were personally reviewed by me and considered in my medical decision making (see lab/imaging section of note for values and interpretations).  Robin Kelly is a 56 y.o. female who presents to [facility] today with complaints of sinus pressure and congestion, diagnosed  with bacterial sinusitis, and treated as such with the medications below. NP and patient reviewed discharge instructions below during visit.   Patient is well appearing overall in clinic today. She does not appear to be in any acute distress. Presenting symptoms (see HPI) and exam as documented above.   I have reviewed the follow up and strict return precautions for any new or worsening symptoms. Patient is aware of symptoms that would be deemed urgent/emergent, and would thus require further evaluation either here or in the emergency department. At the time of discharge, she verbalized understanding and consent with the discharge plan as it was reviewed with her. All questions were fielded by provider and/or clinic staff prior to patient discharge.    Final Clinical Impressions / Urgent Care Diagnoses:   Final diagnoses:  Acute non-recurrent sinusitis, unspecified location    New Prescriptions:  [place] Controlled Substance Registry consulted? Not Applicable  Meds ordered this encounter  Medications   cefdinir (OMNICEF) 300 MG capsule    Sig: Take 1 capsule (300 mg total) by mouth 2 (two) times daily for 7 days.    Dispense:  14 capsule    Refill:  0      Discharge Instructions      You were seen for sinus pressure and congestion and are being treated for bacterial sinus infection.   - Take the antibiotics as  prescribed until they're finished. If you think you're having a reaction, stop the medication, take benadryl and go to the nearest urgent care/emergency room. Take a probiotic while taking the antibiotic to decrease the chances of stomach upset.  - Use antihistamines, such as cetirizine (Zyrtec) or loratadine (Claritin), to help with the drainage.  Benadryl can be used at nighttime to help with sleep. - Over-the-counter Flonase 2 times a day will also help with sinus pressure.  Treat the pain with over-the-counter Tylenol and ibuprofen as needed.   -Mucinex as needed to help with your cough and congestion.  Make sure you stay well-hydrated.  Take care, Dr. Marland Kitchen, NP-c     Recommended Follow up Care:  Patient encouraged to follow up with the following provider within the specified time frame, or sooner as dictated by the severity of her symptoms. As always, she was instructed that for any urgent/emergent care needs, she should seek care either here or in the emergency department for more immediate evaluation.   Gertie Baron, DNP, NP-c   Gertie Baron, NP 08/10/22 1315

## 2022-08-10 NOTE — Discharge Instructions (Addendum)
You were seen for sinus pressure and congestion and are being treated for bacterial sinus infection.   - Take the antibiotics as prescribed until they're finished. If you think you're having a reaction, stop the medication, take benadryl and go to the nearest urgent care/emergency room. Take a probiotic while taking the antibiotic to decrease the chances of stomach upset.  - Use antihistamines, such as cetirizine (Zyrtec) or loratadine (Claritin), to help with the drainage.  Benadryl can be used at nighttime to help with sleep. - Over-the-counter Flonase 2 times a day will also help with sinus pressure.  Treat the pain with over-the-counter Tylenol and ibuprofen as needed.   -Mucinex as needed to help with your cough and congestion.  Make sure you stay well-hydrated.  Take care, Dr. Marland Kitchen, NP-c

## 2022-09-15 ENCOUNTER — Other Ambulatory Visit: Payer: Self-pay | Admitting: Internal Medicine

## 2022-09-17 DIAGNOSIS — J0111 Acute recurrent frontal sinusitis: Secondary | ICD-10-CM | POA: Diagnosis not present

## 2022-09-17 DIAGNOSIS — M159 Polyosteoarthritis, unspecified: Secondary | ICD-10-CM | POA: Diagnosis not present

## 2022-09-17 DIAGNOSIS — Z79899 Other long term (current) drug therapy: Secondary | ICD-10-CM | POA: Diagnosis not present

## 2022-09-17 DIAGNOSIS — M0609 Rheumatoid arthritis without rheumatoid factor, multiple sites: Secondary | ICD-10-CM | POA: Diagnosis not present

## 2022-11-17 DIAGNOSIS — R059 Cough, unspecified: Secondary | ICD-10-CM | POA: Diagnosis not present

## 2022-11-17 DIAGNOSIS — J329 Chronic sinusitis, unspecified: Secondary | ICD-10-CM | POA: Diagnosis not present

## 2022-12-04 ENCOUNTER — Other Ambulatory Visit: Payer: Self-pay | Admitting: Internal Medicine

## 2023-01-09 ENCOUNTER — Ambulatory Visit: Payer: Self-pay | Admitting: Dermatology

## 2023-01-11 ENCOUNTER — Other Ambulatory Visit: Payer: Self-pay | Admitting: Internal Medicine

## 2023-01-23 ENCOUNTER — Encounter: Payer: Self-pay | Admitting: Dermatology

## 2023-01-23 ENCOUNTER — Ambulatory Visit (INDEPENDENT_AMBULATORY_CARE_PROVIDER_SITE_OTHER): Payer: 59 | Admitting: Dermatology

## 2023-01-23 DIAGNOSIS — L821 Other seborrheic keratosis: Secondary | ICD-10-CM | POA: Diagnosis not present

## 2023-01-23 DIAGNOSIS — L578 Other skin changes due to chronic exposure to nonionizing radiation: Secondary | ICD-10-CM

## 2023-01-23 DIAGNOSIS — L988 Other specified disorders of the skin and subcutaneous tissue: Secondary | ICD-10-CM | POA: Diagnosis not present

## 2023-01-23 DIAGNOSIS — Z85820 Personal history of malignant melanoma of skin: Secondary | ICD-10-CM | POA: Diagnosis not present

## 2023-01-23 DIAGNOSIS — L814 Other melanin hyperpigmentation: Secondary | ICD-10-CM | POA: Diagnosis not present

## 2023-01-23 DIAGNOSIS — D229 Melanocytic nevi, unspecified: Secondary | ICD-10-CM

## 2023-01-23 DIAGNOSIS — Z1283 Encounter for screening for malignant neoplasm of skin: Secondary | ICD-10-CM

## 2023-01-23 NOTE — Patient Instructions (Addendum)
Can get Urea cream 40% to use on spots on face nightly, try on legs first to make sure not to irritating  Theodis Shove, MD Dermatologist in [place], [place]  Address: [address] [place], [place] [address] Phone: [phone]  Due to recent changes in healthcare laws, you may see results of your pathology and/or laboratory studies on MyChart before the doctors have had a chance to review them. We understand that in some cases there may be results that are confusing or concerning to you. Please understand that not all results are received at the same time and often the doctors may need to interpret multiple results in order to provide you with the best plan of care or course of treatment. Therefore, we ask that you please give [place] 2 business days to thoroughly review all your results before contacting the office for clarification. Should we see a critical lab result, you will be contacted sooner.   If You Need Anything After Your Visit  If you have any questions or concerns for your doctor, please call our main line at [phone] and press option 4 to reach your doctor's medical assistant. If no one answers, please leave a voicemail as directed and we will return your call as soon as possible. Messages left after 4 pm will be answered the following business day.   You may also send [place] a message via [place]. We typically respond to MyChart messages within 1-2 business days.  For prescription refills, please ask your pharmacy to contact our office. Our fax number is [phone].  If you have an urgent issue when the clinic is closed that cannot wait until the next business day, you can page your doctor at the number below.    Please note that while we do our best to be available for urgent issues outside of office hours, we are not available 24/7.   If you have an urgent issue and are unable to reach [place], you may choose to seek medical care at your doctor's office, retail  clinic, urgent care center, or emergency room.  If you have a medical emergency, please immediately call 911 or go to the emergency department.  Pager Numbers  - Dr. Nehemiah Massed: [phone]  - Dr. Laurence Ferrari: [phone]  - Dr. Nicole Kindred: [phone]  In the event of inclement weather, please call our main line at [phone] for an update on the status of any delays or closures.  Dermatology Medication Tips: Please keep the boxes that topical medications come in in order to help keep track of the instructions about where and how to use these. Pharmacies typically print the medication instructions only on the boxes and not directly on the medication tubes.   If your medication is too expensive, please contact our office at [phone] option 4 or send [place] a message through [place].   We are unable to tell what your co-pay for medications will be in advance as this is different depending on your insurance coverage. However, we may be able to find a substitute medication at lower cost or fill out paperwork to get insurance to cover a needed medication.   If a prior authorization is required to get your medication covered by your insurance company, please allow [place] 1-2 business days to complete this process.  Drug prices often vary depending on where the prescription is filled and some pharmacies may offer cheaper prices.  The website www.goodrx.com contains coupons for medications through different pharmacies. The prices here do  not account for what the cost may be with help from insurance (it may be cheaper with your insurance), but the website can give you the price if you did not use any insurance.  - You can print the associated coupon and take it with your prescription to the pharmacy.  - You may also stop by our office during regular business hours and pick up a GoodRx coupon card.  - If you need your prescription sent electronically to a different pharmacy, notify our office through [facility] or by phone at [phone] option 4.     Si Usted Necesita Algo Despus de Su Visita  Tambin puede enviarnos un mensaje a travs de Pharmacist, community. Por lo general respondemos a los mensajes de MyChart en el transcurso de 1 a 2 das hbiles.  Para renovar recetas, por favor pida a su farmacia que se ponga en contacto con nuestra oficina. Harland Dingwall de fax es [place] [phone].  Si tiene un asunto urgente cuando la clnica est cerrada y que no puede esperar hasta el siguiente da hbil, puede llamar/localizar a su doctor(a) al nmero que aparece a continuacin.   Por favor, tenga en cuenta que aunque hacemos todo lo posible para estar disponibles para asuntos urgentes fuera del horario de [place], no estamos disponibles las 24 horas del da, los 7 das de la [place].   Si tiene un problema urgente y no puede comunicarse con nosotros, puede optar por buscar atencin mdica  en el consultorio de su doctor(a), en una clnica privada, en un centro de atencin urgente o en una sala de emergencias.  Si tiene Engineering geologist, por favor llame inmediatamente al 911 o vaya a la sala de emergencias.  Nmeros de bper  - Dr. Nehemiah Massed: [phone]  - Dra. Moye: [phone]  - Dra. Nicole Kindred: [phone]  En caso de inclemencias del [place], por favor llame a Johnsie Kindred principal al [phone] para una actualizacin sobre el [place] de cualquier retraso o cierre.  Consejos para la medicacin en dermatologa: Por favor, guarde las cajas en las que vienen los medicamentos de uso tpico para ayudarle a seguir las instrucciones sobre dnde y cmo usarlos. Las farmacias generalmente imprimen las instrucciones del medicamento slo en las cajas y no directamente en los tubos del [place].   Si su medicamento es muy caro, por favor, pngase en contacto con Zigmund Daniel llamando al [phone] y presione la opcin 4 o envenos un mensaje a travs de Pharmacist, community.   No podemos decirle  cul ser su copago por los medicamentos por adelantado ya que esto es diferente dependiendo de la cobertura de su seguro. Sin embargo, es posible que podamos encontrar un medicamento sustituto a Electrical engineer un formulario para que el seguro cubra el medicamento que se considera necesario.   Si se requiere una autorizacin previa para que su compaa de seguros [place] su medicamento, por favor permtanos de 1 a 2 das hbiles para completar este proceso.  Los precios de los medicamentos varan con frecuencia dependiendo del Environmental consultant de dnde se surte la receta y alguna farmacias pueden ofrecer precios ms baratos.  El sitio web www.goodrx.com tiene cupones para medicamentos de Airline pilot. Los precios aqu no tienen en cuenta lo que podra costar con la ayuda del seguro (puede ser ms barato con su seguro), pero el sitio web puede darle el precio si no utiliz Research scientist (physical sciences).  - Puede imprimir el cupn correspondiente y llevarlo con su receta a la farmacia.  -  Tambin puede pasar por nuestra oficina durante el horario de atencin regular y Charity fundraiser una tarjeta de cupones de GoodRx.  - Si necesita que su receta se enve electrnicamente a una farmacia diferente, informe a nuestra oficina a travs de MyChart de [place] o por telfono llamando al [phone] y presione la opcin 4.

## 2023-01-23 NOTE — Progress Notes (Signed)
Follow-Up Visit   Subjective  Robin Kelly is a 57 y.o. female who presents for the following: Annual Exam (Hx MM - would like to discuss brown spots on the face and treatment options). The patient presents for Total-Body Skin Exam (TBSE) for skin cancer screening and mole check.  The patient has spots, moles and lesions to be evaluated, some may be new or changing and the patient has concerns that these could be cancer.  The following portions of the chart were reviewed this encounter and updated as appropriate:   Tobacco  Allergies  Meds  Problems  Med Hx  Surg Hx  Fam Hx     Review of Systems:  No other skin or systemic complaints except as noted in HPI or Assessment and Plan.  Objective  Well appearing patient in no apparent distress; mood and affect are within normal limits.  A full examination was performed including scalp, head, eyes, ears, nose, lips, neck, chest, axillae, abdomen, back, buttocks, bilateral upper extremities, bilateral lower extremities, hands, feet, fingers, toes, fingernails, and toenails. All findings within normal limits unless otherwise noted below.  face Stuck-on, waxy, tan-brown papules and plaques -- Discussed benign etiology and prognosis.   face Rhytides and volume loss.    Assessment & Plan  History of melanoma abdomen Txted in 2004 by Dr. Duffy Rhody Clear.  No lymphadenopathy.  Observe for recurrence. Call clinic for new or changing lesions.  Recommend regular skin exams, daily broad-spectrum spf 30+ sunscreen use, and photoprotection.    Seborrheic keratosis face Benign Discussed cosmetic procedure, noncovered.  $60 for 1st lesion and $15 for each additional lesion if done on the same day.  Maximum charge $350.  One touch-up treatment included no charge. Discussed risks of treatment including dyspigmentation, small scar, and/or recurrence. Recommend daily broad spectrum sunscreen SPF 30+/photoprotection to treated areas once healed.    Discussed Urea 40% qhs to aa face  Elastosis of skin face Discussed pt see Dr. Joslyn Devon Cox for consult/evaluation  Lentigines - Scattered tan macules - Due to sun exposure - Benign-appearing, observe - Recommend daily broad spectrum sunscreen SPF 30+ to sun-exposed areas, reapply every 2 hours as needed. - Call for any changes - arms, back  Seborrheic Keratoses - Stuck-on, waxy, tan-brown papules and/or plaques  - Benign-appearing - Discussed benign etiology and prognosis. - Observe - Call for any changes - trunk, arms, legs  Melanocytic Nevi - Tan-brown and/or pink-flesh-colored symmetric macules and papules - Benign appearing on exam today - Observation - Call clinic for new or changing moles - Recommend daily use of broad spectrum spf 30+ sunscreen to sun-exposed areas.   Hemangiomas - Red papules - Discussed benign nature - Observe - Call for any changes - trunk  Actinic Damage - Chronic condition, secondary to cumulative UV/sun exposure - diffuse scaly erythematous macules with underlying dyspigmentation - Recommend daily broad spectrum sunscreen SPF 30+ to sun-exposed areas, reapply every 2 hours as needed.  - Staying in the shade or wearing long sleeves, sun glasses (UVA+UVB protection) and wide brim hats (4-inch brim around the entire circumference of the hat) are also recommended for sun protection.  - Call for new or changing lesions.  Skin cancer screening performed today.  Return in about 1 year (around 01/23/2024) for TBSE.  Luther Redo, CMA, am acting as scribe for Sarina Ser, MD . I, Othelia Pulling, RMA, am acting as scribe for Sarina Ser, MD . Documentation: I have reviewed the above documentation  for accuracy and completeness, and I agree with the above.  Sarina Ser, MD

## 2023-01-31 ENCOUNTER — Encounter: Payer: Self-pay | Admitting: Dermatology

## 2023-02-04 DIAGNOSIS — Z79899 Other long term (current) drug therapy: Secondary | ICD-10-CM | POA: Diagnosis not present

## 2023-02-04 DIAGNOSIS — J0100 Acute maxillary sinusitis, unspecified: Secondary | ICD-10-CM | POA: Diagnosis not present

## 2023-02-04 DIAGNOSIS — M159 Polyosteoarthritis, unspecified: Secondary | ICD-10-CM | POA: Diagnosis not present

## 2023-02-04 DIAGNOSIS — M0609 Rheumatoid arthritis without rheumatoid factor, multiple sites: Secondary | ICD-10-CM | POA: Diagnosis not present

## 2023-02-17 ENCOUNTER — Encounter: Payer: Self-pay | Admitting: Oncology

## 2023-02-17 ENCOUNTER — Inpatient Hospital Stay: Payer: 59

## 2023-02-17 ENCOUNTER — Inpatient Hospital Stay: Payer: 59 | Attending: Oncology | Admitting: Oncology

## 2023-02-17 VITALS — BP 135/72 | HR 77 | Temp 97.3°F | Resp 18 | Wt 181.0 lb

## 2023-02-17 DIAGNOSIS — Z79899 Other long term (current) drug therapy: Secondary | ICD-10-CM | POA: Insufficient documentation

## 2023-02-17 DIAGNOSIS — D72820 Lymphocytosis (symptomatic): Secondary | ICD-10-CM

## 2023-02-17 LAB — CBC WITH DIFFERENTIAL/PLATELET
Abs Immature Granulocytes: 0.01 10*3/uL (ref 0.00–0.07)
Basophils Absolute: 0.1 10*3/uL (ref 0.0–0.1)
Basophils Relative: 1 %
Eosinophils Absolute: 0.2 10*3/uL (ref 0.0–0.5)
Eosinophils Relative: 2 %
HCT: 38.8 % (ref 36.0–46.0)
Hemoglobin: 13.2 g/dL (ref 12.0–15.0)
Immature Granulocytes: 0 %
Lymphocytes Relative: 54 %
Lymphs Abs: 4.5 10*3/uL — ABNORMAL HIGH (ref 0.7–4.0)
MCH: 32 pg (ref 26.0–34.0)
MCHC: 34 g/dL (ref 30.0–36.0)
MCV: 93.9 fL (ref 80.0–100.0)
Monocytes Absolute: 0.9 10*3/uL (ref 0.1–1.0)
Monocytes Relative: 11 %
Neutro Abs: 2.6 10*3/uL (ref 1.7–7.7)
Neutrophils Relative %: 32 %
Platelets: 397 10*3/uL (ref 150–400)
RBC: 4.13 MIL/uL (ref 3.87–5.11)
RDW: 14.3 % (ref 11.5–15.5)
WBC: 8.3 10*3/uL (ref 4.0–10.5)
nRBC: 0 % (ref 0.0–0.2)

## 2023-02-17 LAB — HEPATITIS PANEL, ACUTE
HCV Ab: NONREACTIVE
Hep A IgM: NONREACTIVE
Hep B C IgM: NONREACTIVE
Hepatitis B Surface Ag: NONREACTIVE

## 2023-02-17 LAB — LACTATE DEHYDROGENASE: LDH: 145 U/L (ref 98–192)

## 2023-02-17 LAB — TSH: TSH: 1.399 u[IU]/mL (ref 0.350–4.500)

## 2023-02-17 LAB — HIV ANTIBODY (ROUTINE TESTING W REFLEX): HIV Screen 4th Generation wRfx: NONREACTIVE

## 2023-02-17 NOTE — Assessment & Plan Note (Signed)
Labs reviewed and discussed with patient that Leukocytosis, predominantly lymphocytosis, can be secondary to infection, chronic inflammation, smoking, autoimmune disease, or underlying bone marrow disorders.  Most likely reactive secondary to her autoimmune disease. For the work up of patient's leukocytosis, I recommend checking CBC;CMP, LDH, smear review, peripheral flowcytometry, hepatitis, HIV, monoclonal gammopathy workup.

## 2023-02-17 NOTE — Progress Notes (Signed)
Hematology/Oncology Consult Note [phone] [phone]     REFERRING PROVIDER: Einar Pheasant, MD   CHIEF COMPLAINTS/REASON FOR VISIT:  Evaluation of leukocytosis  ASSESSMENT & PLAN:   Leukocytosis Labs reviewed and discussed with patient that Leukocytosis, predominantly lymphocytosis, can be secondary to infection, chronic inflammation, smoking, autoimmune disease, or underlying bone marrow disorders.  Most likely reactive secondary to her autoimmune disease. For the work up of patient's leukocytosis, I recommend checking CBC;CMP, LDH, smear review, peripheral flowcytometry, hepatitis, HIV, monoclonal gammopathy workup.    If above workup is negative, patient does not need routine follow-up with hematology.  If workup is positive, we will schedule another visit to review results and management plan.  She agrees with the plan. Orders Placed This Encounter  Procedures   CBC with Differential/Platelet    Standing Status:   Future    Number of Occurrences:   1    Standing Expiration Date:   02/17/2024   Multiple Myeloma Panel (SPEP&IFE w/QIG)    Standing Status:   Future    Number of Occurrences:   1    Standing Expiration Date:   02/17/2024   Kappa/lambda light chains    Standing Status:   Future    Number of Occurrences:   1    Standing Expiration Date:   02/17/2024   Flow cytometry panel-leukemia/lymphoma work-up    Standing Status:   Future    Number of Occurrences:   1    Standing Expiration Date:   02/17/2024   Lactate dehydrogenase    Standing Status:   Future    Number of Occurrences:   1    Standing Expiration Date:   02/17/2024   TSH    Standing Status:   Future    Number of Occurrences:   1    Standing Expiration Date:   02/17/2024   BCR-ABL1 FISH    Standing Status:   Future    Number of Occurrences:   1    Standing Expiration Date:   02/17/2024   Hepatitis panel, acute    Standing Status:   Future    Number of Occurrences:   1    Standing  Expiration Date:   02/17/2024   HIV Antibody (routine testing w rflx)    Standing Status:   Future    Number of Occurrences:   1    Standing Expiration Date:   02/17/2024    Cc Quintin Alto, MD All questions were answered. The patient knows to call the clinic with any problems, questions or concerns.  Earlie Server, MD, PhD [facility] Hematology Oncology 02/17/2023    HISTORY OF PRESENTING ILLNESS:  Robin Kelly is a  57 y.o.  female with PMH listed below who was referred to me for evaluation of leukocytosis Reviewed patient' recent labs obtained by PCP.   CBC showed elevated white count of 8.3, mild increase of absolute lymphocytes 4.39, lymphocyte percentage increased 52.7. Previous lab records reviewed. Leukocytosis onset of chronic, Patient was previously seen by Dr. Mike Gip in 2019 for thrombocytosis was felt to be reactive. 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   Denies smoking history. Denies history of recent oral steroid use or steroid injection:  Denies recent infection, denies sinus congestion, cough, urinary frequency/urgency or dysuria, diarrhea, joint swelling/pain or abnormal skin rash, prosthetics. Patient reports history of rheumatoid arthritis, on Humira and methotrexate.  MEDICAL HISTORY:  Past Medical History:  Diagnosis Date   Allergy    Anemia    Anxiety    due to heavy bleeding and SOB   Arthritis    Cancer ([place]) 2004   melanoma   Heavy menstrual bleeding    Melanoma ([place]) 2004   abdomen, txted by Dr. Duffy Rhody   Rheumatoid arthritis ([place])    Seasonal allergies    Shortness of breath dyspnea    "due to anemia"    SURGICAL HISTORY: Past Surgical History:  Procedure Laterality Date   ABDOMINAL HYSTERECTOMY     APPENDECTOMY  2001   right tube and ovary removed also.   BREAST BIOPSY Right 07/28/2019   [place] bx, - BENIGN MAMMARY  PARENCHYMA WITH FIBROCYSTIC    CESAREAN SECTION     x2   CHOLECYSTECTOMY     CYSTOSCOPY N/A 03/12/2016   Procedure: CYSTOSCOPY;  Surgeon: Will Bonnet, MD;  Location: [facility] ORS;  Service: Gynecology;  Laterality: N/A;   DILATATION & CURETTAGE/HYSTEROSCOPY WITH MYOSURE N/A 02/06/2016   Procedure: DILATATION & CURETTAGE HYSTEROSCOPY  polypectomy;  Surgeon: Will Bonnet, MD;  Location: [facility] ORS;  Service: Gynecology;  Laterality: N/A;   POLYPECTOMY N/A 02/06/2016   Procedure: POLYPECTOMY;  Surgeon: Will Bonnet, MD;  Location: [facility] ORS;  Service: Gynecology;  Laterality: N/A;   rheumatoid arthritis     ROBOTIC ASSISTED TOTAL HYSTERECTOMY WITH SALPINGECTOMY Bilateral 03/12/2016   Converted to total abdominal hysterectom   SALPINGOOPHORECTOMY Right    during appendectomy.    SOCIAL HISTORY: Social History   Socioeconomic History   Marital status: Widowed    Spouse name: Not on file   Number of children: Not on file   Years of education: Not on file   Highest education level: Not on file  Occupational History   Not on file  Tobacco Use   Smoking status: Never   Smokeless tobacco: Never  Vaping Use   Vaping Use: Never used  Substance and Sexual Activity   Alcohol use: No   Drug use: No   Sexual activity: Not on file  Other Topics Concern   Not on file  Social History Narrative   Not on file   Social Determinants of Health   Financial Resource Strain: Not on file  Food Insecurity: Not on file  Transportation Needs: Not on file  Physical Activity: Not on file  Stress: Not on file  Social Connections: Not on file  Intimate Partner Violence: Not on file    FAMILY HISTORY: Family History  Problem Relation Age of Onset   Hypertension Mother    Hypertension Father    Heart failure Father    Cancer Father    Diabetes Paternal Grandmother    Breast cancer Neg Hx     ALLERGIES:  is allergic to penicillins and oxycodone.  MEDICATIONS:  Current Outpatient  Medications  Medication Sig Dispense Refill   Adalimumab 40 MG/0.8ML PNKT Inject 40 mg into the skin every 14 (fourteen) days.     aspirin 325 MG tablet Take 325 mg by mouth daily.     citalopram (CELEXA) 40 MG tablet TAKE 1 TABLET BY MOUTH EVERY DAY 90 tablet 1   folic acid (FOLVITE) 1 MG tablet Take 1 mg by mouth daily.     ipratropium (ATROVENT) 0.06 % nasal spray Place 2 sprays into both nostrils 4 (four) times daily. 15 mL 12   methotrexate (RHEUMATREX) 2.5 MG tablet Take by mouth.  benzonatate (TESSALON) 100 MG capsule Take 2 capsules (200 mg total) by mouth every 8 (eight) hours. (Patient not taking: Reported on 02/17/2023) 21 capsule 0   fluconazole (DIFLUCAN) 150 MG tablet Take 1 tablet x 1 day.  If persistent symptoms after three days then may repeat x 1 (Patient not taking: Reported on 01/23/2023) 2 tablet 0   levofloxacin (LEVAQUIN) 500 MG tablet Take 1 tablet (500 mg total) by mouth daily. (Patient not taking: Reported on 01/23/2023) 7 tablet 0   metFORMIN (GLUCOPHAGE-XR) 500 MG 24 hr tablet TAKE 1 TABLET BY MOUTH EVERY DAY WITH BREAKFAST (Patient not taking: Reported on 02/17/2023) 90 tablet 0   promethazine-dextromethorphan (PROMETHAZINE-DM) 6.25-15 MG/5ML syrup Take 5 mLs by mouth 4 (four) times daily as needed. (Patient not taking: Reported on 01/23/2023) 118 mL 0   No current facility-administered medications for this visit.    Review of Systems  Constitutional:  Negative for appetite change, chills, fatigue and fever.  HENT:   Negative for hearing loss and voice change.   Eyes:  Negative for eye problems.  Respiratory:  Negative for chest tightness and cough.   Cardiovascular:  Negative for chest pain.  Gastrointestinal:  Negative for abdominal distention, abdominal pain and blood in stool.  Endocrine: Negative for hot flashes.  Genitourinary:  Negative for difficulty urinating and frequency.   Musculoskeletal:  Positive for arthralgias.  Skin:  Negative for itching and  rash.  Neurological:  Negative for extremity weakness.  Hematological:  Negative for adenopathy.  Psychiatric/Behavioral:  Negative for confusion.     PHYSICAL EXAMINATION: ECOG PERFORMANCE STATUS: 1 - Symptomatic but completely ambulatory Vitals:   02/17/23 1521  BP: 135/72  Pulse: 77  Resp: 18  Temp: (!) 97.3 F (36.3 C)  SpO2: 95%   Filed Weights   02/17/23 1521  Weight: 181 lb (82.1 kg)    Physical Exam Constitutional:      General: She is not in acute distress. HENT:     Head: Normocephalic and atraumatic.  Eyes:     General: No scleral icterus. Cardiovascular:     Rate and Rhythm: Normal rate and regular rhythm.     Heart sounds: Normal heart sounds.  Pulmonary:     Effort: Pulmonary effort is normal. No respiratory distress.     Breath sounds: No wheezing.  Abdominal:     General: Bowel sounds are normal. There is no distension.     Palpations: Abdomen is soft.  Musculoskeletal:        General: No deformity. Normal range of motion.     Cervical back: Normal range of motion and neck supple.  Skin:    General: Skin is warm and dry.     Findings: No erythema or rash.  Neurological:     Mental Status: She is alert and oriented to person, place, and time. Mental status is at baseline.     Cranial Nerves: No cranial nerve deficit.     Coordination: Coordination normal.  Psychiatric:        Mood and Affect: Mood normal.        RADIOGRAPHIC STUDIES: I have personally reviewed the radiological images as listed and agreed with the findings in the report. No results found.  LABORATORY DATA:  I have reviewed the data as listed    Latest Ref Rng & Units 02/17/2023    4:12 PM 09/14/2019   11:40 AM 04/05/2019    1:49 PM  CBC  WBC 4.0 - 10.5 K/uL 8.3  6.6  7.6   Hemoglobin 12.0 - 15.0 g/dL 13.2  14.6  15.4   Hematocrit 36.0 - 46.0 % 38.8  42.8  45.0   Platelets 150 - 400 K/uL 397  382.0  413.0       Latest Ref Rng & Units 01/14/2022    1:04 PM 09/14/2019    11:40 AM 04/05/2019    1:49 PM  CMP  Glucose 70 - 99 mg/dL 88  134  69   BUN 6 - 23 mg/dL 10  12  10    Creatinine 0.40 - 1.20 mg/dL 0.69  0.75  0.77   Sodium 135 - 145 mEq/L 138  136  137   Potassium 3.5 - 5.1 mEq/L 4.3  4.2  4.0   Chloride 96 - 112 mEq/L 105  101  102   CO2 19 - 32 mEq/L 30  25  28    Calcium 8.4 - 10.5 mg/dL 9.3  9.4  9.3   Total Protein 6.0 - 8.3 g/dL  6.9  7.3   Total Bilirubin 0.2 - 1.2 mg/dL  0.4  0.3   Alkaline Phos 39 - 117 U/L  100  105   AST 0 - 37 U/L  17  21   ALT 0 - 35 U/L  15  19

## 2023-02-18 LAB — KAPPA/LAMBDA LIGHT CHAINS
Kappa free light chain: 27.4 mg/L — ABNORMAL HIGH (ref 3.3–19.4)
Kappa, lambda light chain ratio: 1.06 (ref 0.26–1.65)
Lambda free light chains: 25.9 mg/L (ref 5.7–26.3)

## 2023-02-19 LAB — COMP PANEL: LEUKEMIA/LYMPHOMA

## 2023-02-20 LAB — BCR-ABL1 FISH
Cells Analyzed: 200
Cells Counted: 200

## 2023-02-21 LAB — MULTIPLE MYELOMA PANEL, SERUM
Albumin SerPl Elph-Mcnc: 4 g/dL (ref 2.9–4.4)
Albumin/Glob SerPl: 1.3 (ref 0.7–1.7)
Alpha 1: 0.2 g/dL (ref 0.0–0.4)
Alpha2 Glob SerPl Elph-Mcnc: 0.5 g/dL (ref 0.4–1.0)
B-Globulin SerPl Elph-Mcnc: 0.9 g/dL (ref 0.7–1.3)
Gamma Glob SerPl Elph-Mcnc: 1.5 g/dL (ref 0.4–1.8)
Globulin, Total: 3.1 g/dL (ref 2.2–3.9)
IgA: 209 mg/dL (ref 87–352)
IgG (Immunoglobin G), Serum: 1522 mg/dL (ref 586–1602)
IgM (Immunoglobulin M), Srm: 133 mg/dL (ref 26–217)
Total Protein ELP: 7.1 g/dL (ref 6.0–8.5)

## 2023-02-22 ENCOUNTER — Other Ambulatory Visit: Payer: Self-pay | Admitting: Oncology

## 2023-02-22 DIAGNOSIS — D72820 Lymphocytosis (symptomatic): Secondary | ICD-10-CM

## 2023-02-24 ENCOUNTER — Telehealth: Payer: Self-pay

## 2023-02-24 NOTE — Telephone Encounter (Signed)
Robin Kelly can you schedule and notify patient of appointment date and time.

## 2023-02-24 NOTE — Telephone Encounter (Signed)
-----   Message from Earlie Server, MD sent at 02/22/2023  6:27 PM EDT ----- My chart message sent. Please arrange her 6 months follow up  6 months, labs a few days prior to MD  Labs are ordered.

## 2023-03-27 ENCOUNTER — Ambulatory Visit (INDEPENDENT_AMBULATORY_CARE_PROVIDER_SITE_OTHER): Payer: 59 | Admitting: Dermatology

## 2023-03-27 VITALS — BP 133/81 | HR 78

## 2023-03-27 DIAGNOSIS — Z79620 Long term (current) use of immunosuppressive biologic: Secondary | ICD-10-CM | POA: Diagnosis not present

## 2023-03-27 DIAGNOSIS — L2089 Other atopic dermatitis: Secondary | ICD-10-CM

## 2023-03-27 DIAGNOSIS — L209 Atopic dermatitis, unspecified: Secondary | ICD-10-CM

## 2023-03-27 DIAGNOSIS — Z79899 Other long term (current) drug therapy: Secondary | ICD-10-CM

## 2023-03-27 DIAGNOSIS — R21 Rash and other nonspecific skin eruption: Secondary | ICD-10-CM

## 2023-03-27 MED ORDER — MOMETASONE FUROATE 0.1 % EX CREA: TOPICAL_CREAM | CUTANEOUS | 0 refills | Status: DC

## 2023-03-27 NOTE — Patient Instructions (Signed)
Due to recent changes in healthcare laws, you may see results of your pathology and/or laboratory studies on MyChart before the doctors have had a chance to review them. We understand that in some cases there may be results that are confusing or concerning to you. Please understand that not all results are received at the same time and often the doctors may need to interpret multiple results in order to provide you with the best plan of care or course of treatment. Therefore, we ask that you please give us 2 business days to thoroughly review all your results before contacting the office for clarification. Should we see a critical lab result, you will be contacted sooner.   If You Need Anything After Your Visit  If you have any questions or concerns for your doctor, please call our main line at [phone] and press option 4 to reach your doctor's medical assistant. If no one answers, please leave a voicemail as directed and we will return your call as soon as possible. Messages left after 4 pm will be answered the following business day.   You may also send us a message via MyChart. We typically respond to MyChart messages within 1-2 business days.  For prescription refills, please ask your pharmacy to contact our office. Our fax number is [phone].  If you have an urgent issue when the clinic is closed that cannot wait until the next business day, you can page your doctor at the number below.    Please note that while we do our best to be available for urgent issues outside of office hours, we are not available 24/7.   If you have an urgent issue and are unable to reach us, you may choose to seek medical care at your doctor's office, retail clinic, urgent care center, or emergency room.  If you have a medical emergency, please immediately call 911 or go to the emergency department.  Pager Numbers  - Dr. Kowalski: [phone]  - Dr. Moye: [phone]  - Dr. Stewart:  [phone]  In the event of inclement weather, please call our main line at [phone] for an update on the status of any delays or closures.  Dermatology Medication Tips: Please keep the boxes that topical medications come in in order to help keep track of the instructions about where and how to use these. Pharmacies typically print the medication instructions only on the boxes and not directly on the medication tubes.   If your medication is too expensive, please contact our office at [phone] option 4 or send us a message through MyChart.   We are unable to tell what your co-pay for medications will be in advance as this is different depending on your insurance coverage. However, we may be able to find a substitute medication at lower cost or fill out paperwork to get insurance to cover a needed medication.   If a prior authorization is required to get your medication covered by your insurance company, please allow us 1-2 business days to complete this process.  Drug prices often vary depending on where the prescription is filled and some pharmacies may offer cheaper prices.  The website www.goodrx.com contains coupons for medications through different pharmacies. The prices here do not account for what the cost may be with help from insurance (it may be cheaper with your insurance), but the website can give you the price if you did not use any insurance.  - You can print the associated coupon and take it with   your prescription to the pharmacy.  - You may also stop by our office during regular business hours and pick up a GoodRx coupon card.  - If you need your prescription sent electronically to a different pharmacy, notify our office through [facility] MyChart or by phone at [phone] option 4.     Si Usted Necesita Algo Despus de Su Visita  Tambin puede enviarnos un mensaje a travs de MyChart. Por lo general respondemos a los mensajes de MyChart en el transcurso de 1 a 2  das hbiles.  Para renovar recetas, por favor pida a su farmacia que se ponga en contacto con nuestra oficina. Nuestro nmero de fax es el [phone].  Si tiene un asunto urgente cuando la clnica est cerrada y que no puede esperar hasta el siguiente da hbil, puede llamar/localizar a su doctor(a) al nmero que aparece a continuacin.   Por favor, tenga en cuenta que aunque hacemos todo lo posible para estar disponibles para asuntos urgentes fuera del horario de oficina, no estamos disponibles las 24 horas del da, los 7 das de la semana.   Si tiene un problema urgente y no puede comunicarse con nosotros, puede optar por buscar atencin mdica  en el consultorio de su doctor(a), en una clnica privada, en un centro de atencin urgente o en una sala de emergencias.  Si tiene una emergencia mdica, por favor llame inmediatamente al 911 o vaya a la sala de emergencias.  Nmeros de bper  - Dr. Kowalski: [phone]  - Dra. Moye: [phone]  - Dra. Stewart: [phone]  En caso de inclemencias del tiempo, por favor llame a nuestra lnea principal al [phone] para una actualizacin sobre el estado de cualquier retraso o cierre.  Consejos para la medicacin en dermatologa: Por favor, guarde las cajas en las que vienen los medicamentos de uso tpico para ayudarle a seguir las instrucciones sobre dnde y cmo usarlos. Las farmacias generalmente imprimen las instrucciones del medicamento slo en las cajas y no directamente en los tubos del medicamento.   Si su medicamento es muy caro, por favor, pngase en contacto con nuestra oficina llamando al [phone] y presione la opcin 4 o envenos un mensaje a travs de MyChart.   No podemos decirle cul ser su copago por los medicamentos por adelantado ya que esto es diferente dependiendo de la cobertura de su seguro. Sin embargo, es posible que podamos encontrar un medicamento sustituto a menor costo o llenar un formulario para que el  seguro cubra el medicamento que se considera necesario.   Si se requiere una autorizacin previa para que su compaa de seguros cubra su medicamento, por favor permtanos de 1 a 2 das hbiles para completar este proceso.  Los precios de los medicamentos varan con frecuencia dependiendo del lugar de dnde se surte la receta y alguna farmacias pueden ofrecer precios ms baratos.  El sitio web www.goodrx.com tiene cupones para medicamentos de diferentes farmacias. Los precios aqu no tienen en cuenta lo que podra costar con la ayuda del seguro (puede ser ms barato con su seguro), pero el sitio web puede darle el precio si no utiliz ningn seguro.  - Puede imprimir el cupn correspondiente y llevarlo con su receta a la farmacia.  - Tambin puede pasar por nuestra oficina durante el horario de atencin regular y recoger una tarjeta de cupones de GoodRx.  - Si necesita que su receta se enve electrnicamente a una farmacia diferente, informe a nuestra oficina a travs de MyChart de [place]   o por telfono llamando al [phone] y presione la opcin 4.  

## 2023-03-27 NOTE — Progress Notes (Signed)
   Follow Up Visit   Subjective  Robin Kelly is a 57 y.o. female who presents for the following: Rash - L antecutibal x 6 weeks, intermittently itches, OTC Benadryl cream hasn't helped, and oral Benadryl makes her very sleepy. Pt doesn't have outdoor pets, and no one else in the home has a rash. Pt was concerned it was a drug related rash to Humira even though she's been on Humira and MTX for 7 years.   The following portions of the chart were reviewed this encounter and updated as appropriate: medications, allergies, medical history  Review of Systems:  No other skin or systemic complaints except as noted in HPI or Assessment and Plan.  Objective  Well appearing patient in no apparent distress; mood and affect are within normal limits.  A focused examination was performed of the following areas: The face and arms  Relevant exam findings are noted in the Assessment and Plan.   Assessment & Plan    RASH - atopic dermatitis, not consistent with skin infection Exam: Scaly erythematous papules and patches +/- dyspigmentation, lichenification, excoriations.  Chronic and persistent condition with duration or expected duration over one year. Condition is bothersome/symptomatic for patient. Currently flared.  Atopic dermatitis (eczema) is a chronic, relapsing, pruritic condition that can significantly affect quality of life. It is often associated with allergic rhinitis and/or asthma and can require treatment with topical medications, phototherapy, or in severe cases biologic injectable medication (Dupixent; Adbry) or Oral JAK inhibitors.  Treatment Plan: Start Mometasone 0.1% cream to aa's BID up to 2 wks. Topical steroids (such as triamcinolone, fluocinolone, fluocinonide, mometasone, clobetasol, halobetasol, betamethasone, hydrocortisone) can cause thinning and lightening of the skin if they are used for too long in the same area. Your physician has selected the right strength medicine for  your problem and area affected on the body. Please use your medication only as directed by your physician to prevent side effects.   Return for appointment as scheduled.  Maylene Roes, CMA, am acting as scribe for Armida Sans, MD .  Documentation: I have reviewed the above documentation for accuracy and completeness, and I agree with the above.  Armida Sans, MD

## 2023-04-03 ENCOUNTER — Encounter: Payer: Self-pay | Admitting: Dermatology

## 2023-04-22 DIAGNOSIS — H5213 Myopia, bilateral: Secondary | ICD-10-CM | POA: Diagnosis not present

## 2023-05-20 ENCOUNTER — Telehealth: Payer: 59 | Admitting: Physician Assistant

## 2023-05-20 ENCOUNTER — Other Ambulatory Visit: Payer: Self-pay | Admitting: Internal Medicine

## 2023-05-20 DIAGNOSIS — B9689 Other specified bacterial agents as the cause of diseases classified elsewhere: Secondary | ICD-10-CM

## 2023-05-20 DIAGNOSIS — J019 Acute sinusitis, unspecified: Secondary | ICD-10-CM

## 2023-05-20 DIAGNOSIS — H33322 Round hole, left eye: Secondary | ICD-10-CM | POA: Diagnosis not present

## 2023-05-20 MED ORDER — FLUTICASONE PROPIONATE 50 MCG/ACT NA SUSP: 2.0000 | Freq: Every day | NASAL | 0 refills | Status: DC

## 2023-05-20 MED ORDER — DOXYCYCLINE HYCLATE 100 MG PO TABS: 100.0000 mg | ORAL_TABLET | Freq: Two times a day (BID) | ORAL | 0 refills | Status: DC

## 2023-05-20 NOTE — Progress Notes (Signed)
Virtual Visit Consent   [name], you are scheduled for a virtual visit with a [facility] provider today. Just as with appointments in the office, your consent must be obtained to participate. Your consent will be active for this visit and any virtual visit you may have with one of our providers in the next 365 days. If you have a MyChart account, a copy of this consent can be sent to you electronically.  As this is a virtual visit, video technology does not allow for your provider to perform a traditional examination. This may limit your provider's ability to fully assess your condition. If your provider identifies any concerns that need to be evaluated in person or the need to arrange testing (such as labs, EKG, etc.), we will make arrangements to do so. Although advances in technology are sophisticated, we cannot ensure that it will always work on either your end or our end. If the connection with a video visit is poor, the visit may have to be switched to a telephone visit. With either a video or telephone visit, we are not always able to ensure that we have a secure connection.  By engaging in this virtual visit, you consent to the provision of healthcare and authorize for your insurance to be billed (if applicable) for the services provided during this visit. Depending on your insurance coverage, you may receive a charge related to this service.  I need to obtain your verbal consent now. Are you willing to proceed with your visit today? Robin Kelly has provided verbal consent on 05/20/2023 for a virtual visit (video or telephone). Piedad Climes, [place]  Date: 05/20/2023 6:16 PM  Virtual Visit via Video Note   I, Piedad Climes, connected with  Robin Kelly  ([id number], 07/25/1966) on 05/20/23 at  6:15 PM EDT by a video-enabled telemedicine application and verified that I am speaking with the correct person using two identifiers.  Location: Patient: Virtual Visit Location  Patient: Home Provider: Virtual Visit Location Provider: Home Office   I discussed the limitations of evaluation and management by telemedicine and the availability of in person appointments. The patient expressed understanding and agreed to proceed.    History of Present Illness: Robin Kelly is a 57 y.o. who identifies as a female who was assigned female at birth, and is being seen today for a possible sinus infection. 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 Patient notes sinus and nasal congestion with sinus pressure and now thick colored nasal discharge and facial pain. Denies fever, chills. Some cough but no substantial chest congestion.  OTC -- None.   HPI: HPI  Problems:  Patient Active Problem List   Diagnosis Date Noted   Vaginitis 05/26/2022   Encounter for completion of form with patient 01/14/2022   Breast cancer screening 08/05/2021   Rash 08/21/2019   Colon cancer screening 08/21/2019   Cervical cancer screening 08/21/2019   Stress 04/05/2019   Leukocytosis 12/13/2018   Rheumatoid arthritis (HCC) 12/13/2018   Elevated blood pressure reading 12/13/2018   Thrombocytosis 10/13/2017   Seronegative arthritis 05/14/2017   Health care maintenance 04/20/2017   Left knee pain 03/09/2017   Pain of right heel 09/15/2016   Anemia 03/14/2016   Status post abdominal hysterectomy 03/12/2016   Status post hysterectomy 03/12/2016   Menorrhagia with irregular cycle 02/06/2016   Endometrial polyp 02/06/2016  Fibroids 02/06/2016    Allergies:  Allergies  Allergen Reactions   Penicillins Swelling   Oxycodone Palpitations   Medications:  Current Outpatient Medications:    doxycycline (VIBRA-TABS) 100 MG tablet, Take 1 tablet (100 mg total) by mouth 2 (two) times daily., Disp: 20 tablet, Rfl: 0   fluticasone (FLONASE) 50 MCG/ACT nasal spray, Place 2 sprays into both nostrils  daily., Disp: 16 g, Rfl: 0   Adalimumab 40 MG/0.8ML PNKT, Inject 40 mg into the skin every 14 (fourteen) days., Disp: , Rfl:    aspirin 325 MG tablet, Take 325 mg by mouth daily., Disp: , Rfl:    citalopram (CELEXA) 40 MG tablet, TAKE 1 TABLET BY MOUTH EVERY DAY, Disp: 90 tablet, Rfl: 1   folic acid (FOLVITE) 1 MG tablet, Take 1 mg by mouth daily., Disp: , Rfl:    ipratropium (ATROVENT) 0.06 % nasal spray, Place 2 sprays into both nostrils 4 (four) times daily., Disp: 15 mL, Rfl: 12   metFORMIN (GLUCOPHAGE-XR) 500 MG 24 hr tablet, TAKE 1 TABLET BY MOUTH EVERY DAY WITH BREAKFAST (Patient not taking: Reported on 02/17/2023), Disp: 90 tablet, Rfl: 0   methotrexate (RHEUMATREX) 2.5 MG tablet, Take by mouth., Disp: , Rfl:    mometasone (ELOCON) 0.1 % cream, Apply to rash on arm BID up to two weeks., Disp: 45 g, Rfl: 0  Observations/Objective: Patient is well-developed, well-nourished in no acute distress.  Resting comfortably  at home.  Head is normocephalic, atraumatic.  No labored breathing.  Speech is clear and coherent with logical content.  Patient is alert and oriented at baseline.   Assessment and Plan: 1. Acute bacterial sinusitis - fluticasone (FLONASE) 50 MCG/ACT nasal spray; Place 2 sprays into both nostrils daily.  Dispense: 16 g; Refill: 0 - doxycycline (VIBRA-TABS) 100 MG tablet; Take 1 tablet (100 mg total) by mouth 2 (two) times daily.  Dispense: 20 tablet; Refill: 0  Rx Doxycycline.  Increase fluids.  Rest.  Saline nasal spray.  Probiotic.  Mucinex as directed.  Humidifier in bedroom. Flonase per orders.  Call or return to clinic if symptoms are not improving.   Follow Up Instructions: I discussed the assessment and treatment plan with the patient. The patient was provided an opportunity to ask questions and all were answered. The patient agreed with the plan and demonstrated an understanding of the instructions.  A copy of instructions were sent to the patient via MyChart  unless otherwise noted below.   The patient was advised to call back or seek an in-person evaluation if the symptoms worsen or if the condition fails to improve as anticipated.  Time:  I spent 10 minutes with the patient via telehealth technology discussing the above problems/concerns.    Piedad Climes, PA-C

## 2023-05-20 NOTE — Patient Instructions (Signed)
[name], thank you for joining Piedad Climes, PA-C for today's virtual visit.  While this provider is not your primary care provider (PCP), if your PCP is located in our provider database this encounter information will be shared with them immediately following your visit.   A [facility] MyChart account gives you access to today's visit and all your visits, tests, and labs performed at [facility] " click here if you don't have a [facility] MyChart account or go to [email or web address]  Consent: (Patient) Robin Kelly provided verbal consent for this virtual visit at the beginning of the encounter.  Current Medications:  Current Outpatient Medications:    Adalimumab 40 MG/0.8ML PNKT, Inject 40 mg into the skin every 14 (fourteen) days., Disp: , Rfl:    aspirin 325 MG tablet, Take 325 mg by mouth daily., Disp: , Rfl:    benzonatate (TESSALON) 100 MG capsule, Take 2 capsules (200 mg total) by mouth every 8 (eight) hours. (Patient not taking: Reported on 02/17/2023), Disp: 21 capsule, Rfl: 0   citalopram (CELEXA) 40 MG tablet, TAKE 1 TABLET BY MOUTH EVERY DAY, Disp: 90 tablet, Rfl: 1   fluconazole (DIFLUCAN) 150 MG tablet, Take 1 tablet x 1 day.  If persistent symptoms after three days then may repeat x 1 (Patient not taking: Reported on 01/23/2023), Disp: 2 tablet, Rfl: 0   folic acid (FOLVITE) 1 MG tablet, Take 1 mg by mouth daily., Disp: , Rfl:    ipratropium (ATROVENT) 0.06 % nasal spray, Place 2 sprays into both nostrils 4 (four) times daily., Disp: 15 mL, Rfl: 12   levofloxacin (LEVAQUIN) 500 MG tablet, Take 1 tablet (500 mg total) by mouth daily. (Patient not taking: Reported on 01/23/2023), Disp: 7 tablet, Rfl: 0   metFORMIN (GLUCOPHAGE-XR) 500 MG 24 hr tablet, TAKE 1 TABLET BY MOUTH EVERY DAY WITH BREAKFAST (Patient not taking: Reported on 02/17/2023), Disp: 90 tablet, Rfl: 0   methotrexate (RHEUMATREX) 2.5 MG tablet, Take by mouth., Disp: , Rfl:    mometasone  (ELOCON) 0.1 % cream, Apply to rash on arm BID up to two weeks., Disp: 45 g, Rfl: 0   promethazine-dextromethorphan (PROMETHAZINE-DM) 6.25-15 MG/5ML syrup, Take 5 mLs by mouth 4 (four) times daily as needed. (Patient not taking: Reported on 01/23/2023), Disp: 118 mL, Rfl: 0   Medications ordered in this encounter:  No orders of the defined types were placed in this encounter.    *If you need refills on other medications prior to your next appointment, please contact your pharmacy*  Follow-Up: Call back or seek an in-person evaluation if the symptoms worsen or if the condition fails to improve as anticipated.  [facility] [phone]  Other Instructions Please take antibiotic as directed.  Increase fluid intake.  Use Saline nasal spray.  Take a daily multivitamin. Start Flonase as directed.  Place a humidifier in the bedroom.  Please call or return clinic if symptoms are not improving.  Sinusitis Sinusitis is redness, soreness, and swelling (inflammation) of the paranasal sinuses. Paranasal sinuses are air pockets within the bones of your face (beneath the eyes, the middle of the forehead, or above the eyes). In healthy paranasal sinuses, mucus is able to drain out, and air is able to circulate through them by way of your nose. However, when your paranasal sinuses are inflamed, mucus and air can become trapped. This can allow bacteria and other germs to grow and cause infection. Sinusitis can develop quickly and last only  a short time (acute) or continue over a long period (chronic). Sinusitis that lasts for more than 12 weeks is considered chronic.  CAUSES  Causes of sinusitis include: Allergies. Structural abnormalities, such as displacement of the cartilage that separates your nostrils (deviated septum), which can decrease the air flow through your nose and sinuses and affect sinus drainage. Functional abnormalities, such as when the small hairs (cilia) that line your sinuses  and help remove mucus do not work properly or are not present. SYMPTOMS  Symptoms of acute and chronic sinusitis are the same. The primary symptoms are pain and pressure around the affected sinuses. Other symptoms include: Upper toothache. Earache. Headache. Bad breath. Decreased sense of smell and taste. A cough, which worsens when you are lying flat. Fatigue. Fever. Thick drainage from your nose, which often is green and may contain pus (purulent). Swelling and warmth over the affected sinuses. DIAGNOSIS  Your caregiver will perform a physical exam. During the exam, your caregiver may: Look in your nose for signs of abnormal growths in your nostrils (nasal polyps). Tap over the affected sinus to check for signs of infection. View the inside of your sinuses (endoscopy) with a special imaging device with a light attached (endoscope), which is inserted into your sinuses. If your caregiver suspects that you have chronic sinusitis, one or more of the following tests may be recommended: Allergy tests. Nasal culture A sample of mucus is taken from your nose and sent to a lab and screened for bacteria. Nasal cytology A sample of mucus is taken from your nose and examined by your caregiver to determine if your sinusitis is related to an allergy. TREATMENT  Most cases of acute sinusitis are related to a viral infection and will resolve on their own within 10 days. Sometimes medicines are prescribed to help relieve symptoms (pain medicine, decongestants, nasal steroid sprays, or saline sprays).  However, for sinusitis related to a bacterial infection, your caregiver will prescribe antibiotic medicines. These are medicines that will help kill the bacteria causing the infection.  Rarely, sinusitis is caused by a fungal infection. In theses cases, your caregiver will prescribe antifungal medicine. For some cases of chronic sinusitis, surgery is needed. Generally, these are cases in which sinusitis  recurs more than 3 times per year, despite other treatments. HOME CARE INSTRUCTIONS  Drink plenty of water. Water helps thin the mucus so your sinuses can drain more easily. Use a humidifier. Inhale steam 3 to 4 times a day (for example, sit in the bathroom with the shower running). Apply a warm, moist washcloth to your face 3 to 4 times a day, or as directed by your caregiver. Use saline nasal sprays to help moisten and clean your sinuses. Take over-the-counter or prescription medicines for pain, discomfort, or fever only as directed by your caregiver. SEEK IMMEDIATE MEDICAL CARE IF: You have increasing pain or severe headaches. You have nausea, vomiting, or drowsiness. You have swelling around your face. You have vision problems. You have a stiff neck. You have difficulty breathing. MAKE SURE YOU:  Understand these instructions. Will watch your condition. Will get help right away if you are not doing well or get worse. Document Released: 11/11/2005 Document Revised: 02/03/2012 Document Reviewed: 11/26/2011 [facility] Patient Information 2014 [place], [place].    If you have been instructed to have an in-person evaluation today at a local Urgent Care facility, please use the link below. It will take you to a list of all of our available [facility], including address, phone number and hours of operation. Please do not delay care.  [facility]  If you or a family member do not have a primary care provider, use the link below to schedule a visit and establish care. When you choose a [facility] primary care physician or advanced practice provider, you gain a long-term partner in health. Find a Primary Care Provider  Learn more about [facility]'s in-office and virtual care options: [facility]

## 2023-05-22 ENCOUNTER — Encounter: Payer: Self-pay | Admitting: *Deleted

## 2023-05-22 NOTE — Telephone Encounter (Signed)
Pt returned [employer]. Note below was read to her. Pt its book to see provider 07/03/23.

## 2023-05-22 NOTE — Telephone Encounter (Signed)
Called patient left message needs appt for refills. Sent my chart as well.

## 2023-06-23 ENCOUNTER — Other Ambulatory Visit: Payer: Self-pay | Admitting: Internal Medicine

## 2023-07-03 ENCOUNTER — Encounter: Payer: Self-pay | Admitting: Internal Medicine

## 2023-07-03 ENCOUNTER — Telehealth (INDEPENDENT_AMBULATORY_CARE_PROVIDER_SITE_OTHER): Payer: 59 | Admitting: Internal Medicine

## 2023-07-03 DIAGNOSIS — F439 Reaction to severe stress, unspecified: Secondary | ICD-10-CM

## 2023-07-03 DIAGNOSIS — M069 Rheumatoid arthritis, unspecified: Secondary | ICD-10-CM | POA: Diagnosis not present

## 2023-07-03 DIAGNOSIS — D72829 Elevated white blood cell count, unspecified: Secondary | ICD-10-CM

## 2023-07-03 DIAGNOSIS — Z1211 Encounter for screening for malignant neoplasm of colon: Secondary | ICD-10-CM

## 2023-07-03 DIAGNOSIS — J329 Chronic sinusitis, unspecified: Secondary | ICD-10-CM | POA: Diagnosis not present

## 2023-07-03 DIAGNOSIS — Z1231 Encounter for screening mammogram for malignant neoplasm of breast: Secondary | ICD-10-CM | POA: Diagnosis not present

## 2023-07-03 MED ORDER — BENZONATATE 100 MG PO CAPS: 100.0000 mg | ORAL_CAPSULE | Freq: Three times a day (TID) | ORAL | 0 refills | Status: DC | PRN

## 2023-07-03 MED ORDER — DOXYCYCLINE HYCLATE 100 MG PO TABS: 100.0000 mg | ORAL_TABLET | Freq: Two times a day (BID) | ORAL | 0 refills | Status: DC

## 2023-07-03 NOTE — Progress Notes (Signed)
Patient ID: Robin Kelly, female   DOB: 07-Aug-1966, 57 y.o.   MRN: [id number]   Virtual Visit via video Note  I connected with Robin Kelly by a video enabled telemedicine application and verified that I am speaking with the correct person using two identifiers. Location patient: home Location provider: work Persons participating in the virtual visit: patient, provider  The limitations, risks, security and privacy concerns of performing an evaluation and management service by video and the availability of in person appointments have been discussed. It has also been discussed with the patient that there may be a patient responsible charge related to this service. The patient expressed understanding and agreed to proceed.   Reason for visit: follow up appt  HPI: Follow up appt. F/u increased stress and arthritis. Sees Dr Allena Katz for f/u RA.  Taking humira and metotrexate. Stable.  Recently evaluated by Dr Cathie Hoops for lymphocytosis.  W/up unrevealing.  Has f/u in 08/2023.  Being followed by ophthalmology for retinal tear.  Does report that for the last few weeks she has had issues with sinus pressure.  Recently symptoms worsened.  Increased sinus pressure, nasal congestion - productive of green mucus.  Increased drainage.  No fever.  Cough - productive of green mucus.  No chest pain or sob.  No chest congestion.  No vomiting or diarrhea.     ROS: See pertinent positives and negatives per HPI.  Past Medical History:  Diagnosis Date   Allergy    Anemia    Anxiety    due to heavy bleeding and SOB   Arthritis    Cancer (HCC) 2004   melanoma   Heavy menstrual bleeding    Melanoma (HCC) 2004   abdomen, txted by Dr. Darvin Neighbours   Rheumatoid arthritis (HCC)    Seasonal allergies    Shortness of breath dyspnea    "due to anemia"    Past Surgical History:  Procedure Laterality Date   ABDOMINAL HYSTERECTOMY     APPENDECTOMY  2001   right tube and ovary removed also.   BREAST BIOPSY Right  07/28/2019   [place] bx, - BENIGN MAMMARY PARENCHYMA WITH FIBROCYSTIC    CESAREAN SECTION     x2   CHOLECYSTECTOMY     CYSTOSCOPY N/A 03/12/2016   Procedure: CYSTOSCOPY;  Surgeon: Conard Novak, MD;  Location: [facility];  Service: Gynecology;  Laterality: N/A;   DILATATION & CURETTAGE/HYSTEROSCOPY WITH MYOSURE N/A 02/06/2016   Procedure: DILATATION & CURETTAGE HYSTEROSCOPY  polypectomy;  Surgeon: Conard Novak, MD;  Location: [facility];  Service: Gynecology;  Laterality: N/A;   POLYPECTOMY N/A 02/06/2016   Procedure: POLYPECTOMY;  Surgeon: Conard Novak, MD;  Location: [facility];  Service: Gynecology;  Laterality: N/A;   rheumatoid arthritis     ROBOTIC ASSISTED TOTAL HYSTERECTOMY WITH SALPINGECTOMY Bilateral 03/12/2016   Converted to total abdominal hysterectom   SALPINGOOPHORECTOMY Right    during appendectomy.    Family History  Problem Relation Age of Onset   Hypertension Mother    Hypertension Father    Heart failure Father    Cancer Father    Diabetes Paternal Grandmother    Breast cancer Neg Hx     SOCIAL HX: reviewed.    Current Outpatient Medications:    benzonatate (TESSALON PERLES) 100 MG capsule, Take 1 capsule (100 mg total) by mouth 3 (three) times daily as needed for cough., Disp: 21 capsule, Rfl: 0   doxycycline (VIBRA-TABS) 100 MG tablet, Take 1 tablet (100 mg total)  by mouth 2 (two) times daily., Disp: 20 tablet, Rfl: 0   Adalimumab 40 MG/0.8ML PNKT, Inject 40 mg into the skin every 14 (fourteen) days., Disp: , Rfl:    aspirin 325 MG tablet, Take 325 mg by mouth daily., Disp: , Rfl:    citalopram (CELEXA) 40 MG tablet, TAKE 1 TABLET BY MOUTH EVERY DAY, Disp: 90 tablet, Rfl: 1   fluticasone (FLONASE) 50 MCG/ACT nasal spray, Place 2 sprays into both nostrils daily., Disp: 16 g, Rfl: 0   folic acid (FOLVITE) 1 MG tablet, Take 1 mg by mouth daily., Disp: , Rfl:    ipratropium (ATROVENT) 0.06 % nasal spray, Place 2 sprays into both nostrils 4 (four) times daily.,  Disp: 15 mL, Rfl: 12   methotrexate (RHEUMATREX) 2.5 MG tablet, Take by mouth., Disp: , Rfl:   EXAM:  GENERAL: alert, oriented, appears well and in no acute distress  HEENT: atraumatic, conjunttiva clear, no obvious abnormalities on inspection of external nose and ears  NECK: normal movements of the head and neck  LUNGS: on inspection no signs of respiratory distress, breathing rate appears normal, no obvious gross SOB, gasping or wheezing  CV: no obvious cyanosis  PSYCH/NEURO: pleasant and cooperative, no obvious depression or anxiety, speech and thought processing grossly intact  ASSESSMENT AND PLAN:  Discussed the following assessment and plan:  Problem List Items Addressed This Visit     Stress    Overall appears to be handling things relatively well.  Doing well on citalopram.  Follow.       Sinusitis    Increased sinus pressure and congestion as outlined.  Was around people last week who later tested positive for covid. They had no symptoms when around her.  Her symptoms have been present for three weeks.  Worsened recently.  Covid test negative x 2.  Treat with doxycycline as directed.  Tessalon perles.  Saline nasal spray and flonase as directed.  Robitussin DM.  Follow.  Call with update.       Relevant Medications   doxycycline (VIBRA-TABS) 100 MG tablet   benzonatate (TESSALON PERLES) 100 MG capsule   Rheumatoid arthritis (HCC)    Continue humira and MTX.  Doing well.  Overall relatively stable. Follow. Has f/u next week.       Leukocytosis (Chronic)    Being followed by hematology.       Colon cancer screening    cologuard 10/2020 - negative.       Breast cancer screening    Mammogram ordered. She will call and schedule.       Other Visit Diagnoses     Visit for screening mammogram    -  Primary   Relevant Orders   MM 3D SCREENING MAMMOGRAM BILATERAL BREAST       Return in about 8 weeks (around 08/28/2023) for physical - schedule fasting labs in  1-2 weeks. .   I discussed the assessment and treatment plan with the patient. The patient was provided an opportunity to ask questions and all were answered. The patient agreed with the plan and demonstrated an understanding of the instructions.   The patient was advised to call back or seek an in-person evaluation if the symptoms worsen or if the condition fails to improve as anticipated.   Dale [place], MD

## 2023-07-03 NOTE — Assessment & Plan Note (Signed)
Continue humira and MTX.  Doing well.  Overall relatively stable. Follow. Has f/u next week.

## 2023-07-03 NOTE — Assessment & Plan Note (Signed)
Mammogram ordered. She will call and schedule.

## 2023-07-03 NOTE — Assessment & Plan Note (Signed)
Overall appears to be handling things relatively well.  Doing well on citalopram.  Follow.

## 2023-07-03 NOTE — Assessment & Plan Note (Signed)
Being followed by hematology.   

## 2023-07-03 NOTE — Assessment & Plan Note (Signed)
cologuard 10/2020 - negative.

## 2023-07-03 NOTE — Assessment & Plan Note (Signed)
Increased sinus pressure and congestion as outlined.  Was around people last week who later tested positive for covid. They had no symptoms when around her.  Her symptoms have been present for three weeks.  Worsened recently.  Covid test negative x 2.  Treat with doxycycline as directed.  Tessalon perles.  Saline nasal spray and flonase as directed.  Robitussin DM.  Follow.  Call with update.

## 2023-07-23 DIAGNOSIS — H33322 Round hole, left eye: Secondary | ICD-10-CM | POA: Diagnosis not present

## 2023-07-30 ENCOUNTER — Telehealth: Payer: Self-pay

## 2023-07-30 NOTE — Telephone Encounter (Signed)
Left voicemail for patient asking her to please call us back to schedule an 8-week follow-up with Dr. Dale [place] and a fasting lab appointment a couple of days before the visit (per Dr. Lorin Picket).

## 2023-08-26 ENCOUNTER — Other Ambulatory Visit: Payer: 59

## 2023-08-26 ENCOUNTER — Inpatient Hospital Stay: Payer: 59 | Attending: Oncology

## 2023-08-26 DIAGNOSIS — D72821 Monocytosis (symptomatic): Secondary | ICD-10-CM | POA: Diagnosis not present

## 2023-08-26 DIAGNOSIS — D72820 Lymphocytosis (symptomatic): Secondary | ICD-10-CM | POA: Diagnosis not present

## 2023-08-26 LAB — CBC WITH DIFFERENTIAL/PLATELET
Abs Immature Granulocytes: 0.02 10*3/uL (ref 0.00–0.07)
Basophils Absolute: 0.1 10*3/uL (ref 0.0–0.1)
Basophils Relative: 1 %
Eosinophils Absolute: 0.2 10*3/uL (ref 0.0–0.5)
Eosinophils Relative: 2 %
HCT: 39.4 % (ref 36.0–46.0)
Hemoglobin: 13.5 g/dL (ref 12.0–15.0)
Immature Granulocytes: 0 %
Lymphocytes Relative: 48 %
Lymphs Abs: 4.3 10*3/uL — ABNORMAL HIGH (ref 0.7–4.0)
MCH: 32.8 pg (ref 26.0–34.0)
MCHC: 34.3 g/dL (ref 30.0–36.0)
MCV: 95.6 fL (ref 80.0–100.0)
Monocytes Absolute: 1.1 10*3/uL — ABNORMAL HIGH (ref 0.1–1.0)
Monocytes Relative: 12 %
Neutro Abs: 3.3 10*3/uL (ref 1.7–7.7)
Neutrophils Relative %: 37 %
Platelets: 409 10*3/uL — ABNORMAL HIGH (ref 150–400)
RBC: 4.12 MIL/uL (ref 3.87–5.11)
RDW: 14.1 % (ref 11.5–15.5)
WBC: 8.9 10*3/uL (ref 4.0–10.5)
nRBC: 0 % (ref 0.0–0.2)

## 2023-08-28 ENCOUNTER — Ambulatory Visit
Admission: RE | Admit: 2023-08-28 | Discharge: 2023-08-28 | Disposition: A | Discharge status: Home / Self Care | Payer: 59 | Source: Ambulatory Visit | Attending: Internal Medicine | Admitting: Internal Medicine

## 2023-08-28 DIAGNOSIS — Z1231 Encounter for screening mammogram for malignant neoplasm of breast: Secondary | ICD-10-CM | POA: Diagnosis not present

## 2023-08-29 LAB — COMP PANEL: LEUKEMIA/LYMPHOMA

## 2023-09-01 ENCOUNTER — Encounter: Payer: Self-pay | Admitting: Oncology

## 2023-09-01 ENCOUNTER — Ambulatory Visit: Payer: 59 | Admitting: Oncology

## 2023-09-01 ENCOUNTER — Inpatient Hospital Stay (HOSPITAL_BASED_OUTPATIENT_CLINIC_OR_DEPARTMENT_OTHER): Payer: 59 | Admitting: Oncology

## 2023-09-01 VITALS — BP 130/75 | HR 76 | Temp 97.6°F | Resp 18 | Wt 186.7 lb

## 2023-09-01 DIAGNOSIS — D72829 Elevated white blood cell count, unspecified: Secondary | ICD-10-CM

## 2023-09-01 DIAGNOSIS — D72821 Monocytosis (symptomatic): Secondary | ICD-10-CM | POA: Diagnosis not present

## 2023-09-01 DIAGNOSIS — D72820 Lymphocytosis (symptomatic): Secondary | ICD-10-CM | POA: Diagnosis not present

## 2023-09-01 NOTE — Assessment & Plan Note (Addendum)
Labs reviewed and discussed with patient that Leukocytosis, predominantly lymphocytosis, can be secondary to infection, chronic inflammation, smoking, autoimmune disease, or underlying bone marrow disorders.   Most likely reactive secondary to her autoimmune disease/RF Her work up showed monocytosis and lymphocytosis. No M protein on SPEP Flowcytometry showed No significant immunophenotypic abnormality detected, lymphocyte is due to increase in CD8+ T cells, NK cells  and B cells, likely due to chronic inflammation Persistent monocytosis, likely due to chronic inflammation, less likely due to CMML. She has no constitutional symptoms.

## 2023-09-01 NOTE — Progress Notes (Signed)
Hematology/Oncology Consult Note [phone] [phone]     REFERRING PROVIDER: Dale [place], MD   CHIEF COMPLAINTS/REASON FOR VISIT:  Evaluation of leukocytosis  ASSESSMENT & PLAN:   Leukocytosis Labs reviewed and discussed with patient that Leukocytosis, predominantly lymphocytosis, can be secondary to infection, chronic inflammation, smoking, autoimmune disease, or underlying bone marrow disorders.   Most likely reactive secondary to her autoimmune disease/RF Her work up showed monocytosis and lymphocytosis. No M protein on SPEP Flowcytometry showed No significant immunophenotypic abnormality detected, lymphocyte is due to increase in CD8+ T cells, NK cells  and B cells, likely due to chronic inflammation Persistent monocytosis, likely due to chronic inflammation, less likely due to CMML. She has no constitutional symptoms.   No need for routine follow up. Patient is discharged from my clinic. I recommend patient to continue follow up with primary care physician and rheumatology. Patient may re-establish care in the future if clinically indicated.    Cc Dale [place], MD All questions were answered. The patient knows to call the clinic with any problems, questions or concerns.  Rickard Patience, MD, PhD [facility] Oncology 09/01/2023    HISTORY OF PRESENTING ILLNESS:  Robin Kelly is a  57 y.o.  female with PMH listed below who was referred to me for evaluation of leukocytosis Reviewed patient' recent labs obtained by PCP.   CBC showed elevated white count of 8.3, mild increase of absolute lymphocytes 4.39, lymphocyte percentage increased 52.7. Previous lab records reviewed. Leukocytosis onset of chronic, Patient was previously seen by Dr. Merlene Pulling in 2019 for thrombocytosis was felt to be reactive. 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   Denies smoking history. Denies history of recent oral steroid use or steroid injection:  Denies recent infection, denies sinus congestion, cough, urinary frequency/urgency or dysuria, diarrhea, joint swelling/pain or abnormal skin rash, prosthetics. Patient reports history of rheumatoid arthritis, on Humira and methotrexate.  INTERVAL HISTORY Robin Kelly is a 57 y.o. female who has above history reviewed by me today presents for follow up visit for leukocytosis.  She has no new complaints. Denies weight loss, fever, chills, fatigue, night sweats.     MEDICAL HISTORY:  Past Medical History:  Diagnosis Date   Allergy    Anemia    Anxiety    due to heavy bleeding and SOB   Arthritis    Cancer (HCC) 2004   melanoma   Heavy menstrual bleeding    Melanoma (HCC) 2004   abdomen, txted by Dr. Darvin Neighbours   Rheumatoid arthritis (HCC)    Seasonal allergies    Shortness of breath dyspnea    "due to anemia"    SURGICAL HISTORY: Past Surgical History:  Procedure Laterality Date   ABDOMINAL HYSTERECTOMY     APPENDECTOMY  2001   right tube and ovary removed also.   BREAST BIOPSY Right 07/28/2019   [place] bx, - BENIGN MAMMARY PARENCHYMA WITH FIBROCYSTIC    CESAREAN SECTION     x2   CHOLECYSTECTOMY     CYSTOSCOPY N/A 03/12/2016   Procedure: CYSTOSCOPY;  Surgeon: Conard Novak, MD;  Location: [facility];  Service: Gynecology;  Laterality: N/A;   DILATATION & CURETTAGE/HYSTEROSCOPY WITH MYOSURE N/A 02/06/2016   Procedure: DILATATION & CURETTAGE HYSTEROSCOPY  polypectomy;  Surgeon: Conard Novak, MD;  Location: [facility];  Service: Gynecology;  Laterality: N/A;   POLYPECTOMY N/A 02/06/2016  Procedure: POLYPECTOMY;  Surgeon: Conard Novak, MD;  Location: [facility];  Service: Gynecology;  Laterality: N/A;   rheumatoid arthritis     ROBOTIC ASSISTED TOTAL HYSTERECTOMY WITH SALPINGECTOMY Bilateral 03/12/2016   Converted to total abdominal hysterectom   SALPINGOOPHORECTOMY  Right    during appendectomy.    SOCIAL HISTORY: Social History   Socioeconomic History   Marital status: Widowed    Spouse name: Not on file   Number of children: Not on file   Years of education: Not on file   Highest education level: Some college, no degree  Occupational History   Not on file  Tobacco Use   Smoking status: Never   Smokeless tobacco: Never  Vaping Use   Vaping status: Never Used  Substance and Sexual Activity   Alcohol use: No   Drug use: No   Sexual activity: Not on file  Other Topics Concern   Not on file  Social History Narrative   Not on file   Social Determinants of Health   Financial Resource Strain: Low Risk  (06/30/2023)   Overall Financial Resource Strain (CARDIA)    Difficulty of Paying Living Expenses: Not very hard  Food Insecurity: No Food Insecurity (06/30/2023)   Hunger Vital Sign    Worried About Running Out of Food in the Last Year: Never true    Ran Out of Food in the Last Year: Never true  Transportation Needs: No Transportation Needs (06/30/2023)   PRAPARE - Administrator, Civil Service (Medical): No    Lack of Transportation (Non-Medical): No  Physical Activity: Insufficiently Active (06/30/2023)   Exercise Vital Sign    Days of Exercise per Week: 3 days    Minutes of Exercise per Session: 30 min  Stress: No Stress Concern Present (06/30/2023)   [employer] of Occupational Health - Occupational Stress Questionnaire    Feeling of Stress : Only a little  Social Connections: Moderately Integrated (06/30/2023)   Social Connection and Isolation Panel [NHANES]    Frequency of Communication with Friends and Family: More than three times a week    Frequency of Social Gatherings with Friends and Family: Three times a week    Attends Religious Services: More than 4 times per year    Active Member of Clubs or Organizations: Yes    Attends Banker Meetings: 1 to 4 times per year    Marital Status: Widowed   Catering manager Violence: Not on file    FAMILY HISTORY: Family History  Problem Relation Age of Onset   Hypertension Mother    Hypertension Father    Heart failure Father    Cancer Father    Diabetes Paternal Grandmother    Breast cancer Neg Hx     ALLERGIES:  is allergic to penicillins and oxycodone.  MEDICATIONS:  Current Outpatient Medications  Medication Sig Dispense Refill   Adalimumab 40 MG/0.8ML PNKT Inject 40 mg into the skin every 14 (fourteen) days. Humira     aspirin 325 MG tablet Take 325 mg by mouth daily.     citalopram (CELEXA) 40 MG tablet TAKE 1 TABLET BY MOUTH EVERY DAY 90 tablet 1   fluticasone (FLONASE) 50 MCG/ACT nasal spray Place 2 sprays into both nostrils daily. 16 g 0   folic acid (FOLVITE) 1 MG tablet Take 1 mg by mouth daily.     ipratropium (ATROVENT) 0.06 % nasal spray Place 2 sprays into both nostrils 4 (four) times daily. 15 mL 12  methotrexate (RHEUMATREX) 2.5 MG tablet Take by mouth.     No current facility-administered medications for this visit.    Review of Systems  Constitutional:  Negative for appetite change, chills, fatigue and fever.  HENT:   Negative for hearing loss and voice change.   Eyes:  Negative for eye problems.  Respiratory:  Negative for chest tightness and cough.   Cardiovascular:  Negative for chest pain.  Gastrointestinal:  Negative for abdominal distention, abdominal pain and blood in stool.  Endocrine: Negative for hot flashes.  Genitourinary:  Negative for difficulty urinating and frequency.   Musculoskeletal:  Positive for arthralgias.  Skin:  Negative for itching and rash.  Neurological:  Negative for extremity weakness.  Hematological:  Negative for adenopathy.  Psychiatric/Behavioral:  Negative for confusion.     PHYSICAL EXAMINATION: ECOG PERFORMANCE STATUS: 1 - Symptomatic but completely ambulatory Vitals:   09/01/23 1504  BP: 130/75  Pulse: 76  Resp: 18  Temp: 97.6 F (36.4 C)   Filed Weights    09/01/23 1504  Weight: 186 lb 11.2 oz (84.7 kg)    Physical Exam Constitutional:      General: She is not in acute distress. HENT:     Head: Normocephalic and atraumatic.  Eyes:     General: No scleral icterus. Cardiovascular:     Rate and Rhythm: Normal rate.  Pulmonary:     Effort: Pulmonary effort is normal. No respiratory distress.  Abdominal:     General: There is no distension.  Musculoskeletal:        General: Normal range of motion.     Cervical back: Normal range of motion and neck supple.  Skin:    General: Skin is warm and dry.     Findings: No rash.  Neurological:     Mental Status: She is alert and oriented to person, place, and time. Mental status is at baseline.  Psychiatric:        Mood and Affect: Mood normal.        RADIOGRAPHIC STUDIES: I have personally reviewed the radiological images as listed and agreed with the findings in the report. MM 3D SCREENING MAMMOGRAM BILATERAL BREAST  Result Date: 09/01/2023 CLINICAL DATA:  Screening. EXAM: DIGITAL SCREENING BILATERAL MAMMOGRAM WITH TOMOSYNTHESIS AND CAD TECHNIQUE: Bilateral screening digital craniocaudal and mediolateral oblique mammograms were obtained. Bilateral screening digital breast tomosynthesis was performed. The images were evaluated with computer-aided detection. COMPARISON:  Previous exam(s). ACR Breast Density Category b: There are scattered areas of fibroglandular density. FINDINGS: There are no findings suspicious for malignancy. IMPRESSION: No mammographic evidence of malignancy. A result letter of this screening mammogram will be mailed directly to the patient. RECOMMENDATION: Screening mammogram in one year. (Code:SM-B-01Y) BI-RADS CATEGORY  1: Negative. Electronically Signed   By: Baird Lyons M.D.   On: 09/01/2023 16:10    LABORATORY DATA:  I have reviewed the data as listed    Latest Ref Rng & Units 08/26/2023    2:45 PM 02/17/2023    4:12 PM 09/14/2019   11:40 AM  CBC  WBC 4.0 -  10.5 K/uL 8.9  8.3  6.6   Hemoglobin 12.0 - 15.0 g/dL 82.9  56.2  13.0   Hematocrit 36.0 - 46.0 % 39.4  38.8  42.8   Platelets 150 - 400 K/uL 409  397  382.0       Latest Ref Rng & Units 01/14/2022    1:04 PM 09/14/2019   11:40 AM 04/05/2019    1:49 PM  CMP  Glucose 70 - 99 mg/dL 88  161  69   BUN 6 - 23 mg/dL 10  12  10    Creatinine 0.40 - 1.20 mg/dL 0.96  0.45  4.09   Sodium 135 - 145 mEq/L 138  136  137   Potassium 3.5 - 5.1 mEq/L 4.3  4.2  4.0   Chloride 96 - 112 mEq/L 105  101  102   CO2 19 - 32 mEq/L 30  25  28    Calcium 8.4 - 10.5 mg/dL 9.3  9.4  9.3   Total Protein 6.0 - 8.3 g/dL  6.9  7.3   Total Bilirubin 0.2 - 1.2 mg/dL  0.4  0.3   Alkaline Phos 39 - 117 U/L  100  105   AST 0 - 37 U/L  17  21   ALT 0 - 35 U/L  15  19

## 2023-09-04 ENCOUNTER — Encounter: Payer: Self-pay | Admitting: Internal Medicine

## 2023-09-04 ENCOUNTER — Ambulatory Visit (INDEPENDENT_AMBULATORY_CARE_PROVIDER_SITE_OTHER): Payer: 59 | Admitting: Internal Medicine

## 2023-09-04 VITALS — BP 120/70 | HR 86 | Temp 98.0°F | Resp 16 | Ht 61.0 in | Wt 185.0 lb

## 2023-09-04 DIAGNOSIS — M069 Rheumatoid arthritis, unspecified: Secondary | ICD-10-CM

## 2023-09-04 DIAGNOSIS — Z713 Dietary counseling and surveillance: Secondary | ICD-10-CM | POA: Diagnosis not present

## 2023-09-04 DIAGNOSIS — F439 Reaction to severe stress, unspecified: Secondary | ICD-10-CM

## 2023-09-04 DIAGNOSIS — Z1211 Encounter for screening for malignant neoplasm of colon: Secondary | ICD-10-CM

## 2023-09-04 DIAGNOSIS — D72829 Elevated white blood cell count, unspecified: Secondary | ICD-10-CM | POA: Diagnosis not present

## 2023-09-04 DIAGNOSIS — E7800 Pure hypercholesterolemia, unspecified: Secondary | ICD-10-CM | POA: Diagnosis not present

## 2023-09-04 LAB — LIPID PANEL
Cholesterol: 168 mg/dL (ref 0–200)
HDL: 42.8 mg/dL (ref >39.00)
LDL Cholesterol: 103 mg/dL — ABNORMAL HIGH (ref 0–99)
NonHDL: 125.13
Total CHOL/HDL Ratio: 4
Triglycerides: 113 mg/dL (ref 0.0–149.0)
VLDL: 22.6 mg/dL (ref 0.0–40.0)

## 2023-09-04 LAB — BASIC METABOLIC PANEL
BUN: 14 mg/dL (ref 6–23)
CO2: 30 meq/L (ref 19–32)
Calcium: 9.3 mg/dL (ref 8.4–10.5)
Chloride: 106 meq/L (ref 96–112)
Creatinine, Ser: 0.65 mg/dL (ref 0.40–1.20)
GFR: 97.35 mL/min (ref >60.00)
Glucose, Bld: 88 mg/dL (ref 70–99)
Potassium: 4.1 meq/L (ref 3.5–5.1)
Sodium: 139 meq/L (ref 135–145)

## 2023-09-04 LAB — HEPATIC FUNCTION PANEL
ALT: 17 U/L (ref 0–35)
AST: 17 U/L (ref 0–37)
Albumin: 4.1 g/dL (ref 3.5–5.2)
Alkaline Phosphatase: 95 U/L (ref 39–117)
Bilirubin, Direct: 0 mg/dL (ref 0.0–0.3)
Total Bilirubin: 0.4 mg/dL (ref 0.2–1.2)
Total Protein: 6.9 g/dL (ref 6.0–8.3)

## 2023-09-04 MED ORDER — BUSPIRONE HCL 5 MG PO TABS: 5.0000 mg | ORAL_TABLET | Freq: Every day | ORAL | 2 refills | Status: DC

## 2023-09-04 MED ORDER — WEGOVY 0.25 MG/0.5ML ~~LOC~~ SOAJ: 0.2500 mg | SUBCUTANEOUS | 2 refills | Status: DC

## 2023-09-04 NOTE — Assessment & Plan Note (Signed)
cologuard 10/2020 - negative.

## 2023-09-04 NOTE — Assessment & Plan Note (Signed)
Discussed diet and exercise.  Discussed medication.  Discussed GLP 1 agonist and possible side effects.  No history of gallbladder issues, pancreatitis.  No family history of medullary thyroid cancer, etc. She desires to start GLP 1 agonist.  ZOXWRU .25mg .  Follow.

## 2023-09-04 NOTE — Assessment & Plan Note (Signed)
Overall appears to be handling things relatively well.  Doing well on citalopram.  Discussed increased stress.  Will have her decrease her citalopram back down to 40mg  q day.  Start buspar 5mg  q day.  Follow.

## 2023-09-04 NOTE — Assessment & Plan Note (Signed)
Recently evaluated by Dr Cathie Hoops for lymphocytosis. Felt her leucocytosis (predominantly lymphocytosis) -most likely reactive secondary to her autoimmune disease/RF.  No M protein on SPEP. Discharged from hematology.

## 2023-09-04 NOTE — Assessment & Plan Note (Signed)
Continue humira and MTX.  Doing well.  Overall relatively stable. Continue f/u with Dr Allena Katz

## 2023-09-04 NOTE — Assessment & Plan Note (Signed)
Low cholesterol diet and exercise.  Follow lipid panel.   

## 2023-09-04 NOTE — Progress Notes (Signed)
Subjective:    Patient ID: Robin Kelly, female    DOB: 1965-12-25, 57 y.o.   MRN: [id number]  Patient here for  Chief Complaint  Patient presents with   Medical Management of Chronic Issues    HPI Here for a scheduled follow up - f/u regarding increased stress and RA.  Sees Dr Allena Katz for her RA.  Taking humira and methotrexate. Stable. Recently evaluated by Dr Cathie Hoops for lymphocytosis. Felt her leucocytosis (predominantly lymphocytosis) -most likely reactive secondary to her autoimmune disease/RF.  No M protein on SPEP. Discharged from hematology.  Reports she is doing well.  Feels good.  Discussed diet and exercise.  Discussed weight loss medication. She is interested in trial of GLP 1 agonist. Discussed medication and side effects along with contraindication.  No chest pain or sob.  No abdominal pain or bowel change.  Increased stress.  She has increased her citalopram to 1 1/2 tablet.  Discussed.     Past Medical History:  Diagnosis Date   Allergy    Anemia    Anxiety    due to heavy bleeding and SOB   Arthritis    Cancer (HCC) 2004   melanoma   Heavy menstrual bleeding    Melanoma (HCC) 2004   abdomen, txted by Dr. Darvin Neighbours   Rheumatoid arthritis (HCC)    Seasonal allergies    Shortness of breath dyspnea    "due to anemia"   Past Surgical History:  Procedure Laterality Date   ABDOMINAL HYSTERECTOMY     APPENDECTOMY  2001   right tube and ovary removed also.   BREAST BIOPSY Right 07/28/2019   [place] bx, - BENIGN MAMMARY PARENCHYMA WITH FIBROCYSTIC    CESAREAN SECTION     x2   CHOLECYSTECTOMY     CYSTOSCOPY N/A 03/12/2016   Procedure: CYSTOSCOPY;  Surgeon: Conard Novak, MD;  Location: [facility];  Service: Gynecology;  Laterality: N/A;   DILATATION & CURETTAGE/HYSTEROSCOPY WITH MYOSURE N/A 02/06/2016   Procedure: DILATATION & CURETTAGE HYSTEROSCOPY  polypectomy;  Surgeon: Conard Novak, MD;  Location: [facility];  Service: Gynecology;  Laterality: N/A;    POLYPECTOMY N/A 02/06/2016   Procedure: POLYPECTOMY;  Surgeon: Conard Novak, MD;  Location: [facility];  Service: Gynecology;  Laterality: N/A;   rheumatoid arthritis     ROBOTIC ASSISTED TOTAL HYSTERECTOMY WITH SALPINGECTOMY Bilateral 03/12/2016   Converted to total abdominal hysterectom   SALPINGOOPHORECTOMY Right    during appendectomy.   Family History  Problem Relation Age of Onset   Hypertension Mother    Hypertension Father    Heart failure Father    Cancer Father    Diabetes Paternal Grandmother    Breast cancer Neg Hx    Social History   Socioeconomic History   Marital status: Widowed    Spouse name: Not on file   Number of children: Not on file   Years of education: Not on file   Highest education level: Some college, no degree  Occupational History   Not on file  Tobacco Use   Smoking status: Never   Smokeless tobacco: Never  Vaping Use   Vaping status: Never Used  Substance and Sexual Activity   Alcohol use: No   Drug use: No   Sexual activity: Not on file  Other Topics Concern   Not on file  Social History Narrative   Not on file   Social Determinants of Health   Financial Resource Strain: Low Risk  (06/30/2023)  Overall Financial Resource Strain (CARDIA)    Difficulty of Paying Living Expenses: Not very hard  Food Insecurity: No Food Insecurity (06/30/2023)   Hunger Vital Sign    Worried About Running Out of Food in the Last Year: Never true    Ran Out of Food in the Last Year: Never true  Transportation Needs: No Transportation Needs (06/30/2023)   PRAPARE - Administrator, Civil Service (Medical): No    Lack of Transportation (Non-Medical): No  Physical Activity: Insufficiently Active (06/30/2023)   Exercise Vital Sign    Days of Exercise per Week: 3 days    Minutes of Exercise per Session: 30 min  Stress: No Stress Concern Present (06/30/2023)   [employer] of Occupational Health - Occupational Stress Questionnaire    Feeling of  Stress : Only a little  Social Connections: Moderately Integrated (06/30/2023)   Social Connection and Isolation Panel [NHANES]    Frequency of Communication with Friends and Family: More than three times a week    Frequency of Social Gatherings with Friends and Family: Three times a week    Attends Religious Services: More than 4 times per year    Active Member of Clubs or Organizations: Yes    Attends Banker Meetings: 1 to 4 times per year    Marital Status: Widowed     Review of Systems  Constitutional:  Negative for appetite change and unexpected weight change.  HENT:  Negative for congestion and sinus pressure.   Respiratory:  Negative for cough, chest tightness and shortness of breath.   Cardiovascular:  Negative for chest pain, palpitations and leg swelling.  Gastrointestinal:  Negative for abdominal pain, diarrhea, nausea and vomiting.  Genitourinary:  Negative for difficulty urinating and dysuria.  Musculoskeletal:  Negative for joint swelling and myalgias.  Skin:  Negative for color change and rash.  Neurological:  Negative for dizziness and headaches.  Psychiatric/Behavioral:  Negative for agitation and dysphoric mood.        Increased stress as outlined.        Objective:     BP 120/70   Pulse 86   Temp 98 F (36.7 C)   Resp 16   Ht 5\' 1"  (1.549 m)   Wt 185 lb (83.9 kg)   LMP 10/23/2015 Comment: bleeding since 10/23/15  SpO2 98%   BMI 34.96 kg/m  Wt Readings from Last 3 Encounters:  09/04/23 185 lb (83.9 kg)  09/01/23 186 lb 11.2 oz (84.7 kg)  02/17/23 181 lb (82.1 kg)    Physical Exam Vitals reviewed.  Constitutional:      General: She is not in acute distress.    Appearance: Normal appearance.  HENT:     Head: Normocephalic and atraumatic.     Right Ear: External ear normal.     Left Ear: External ear normal.  Eyes:     General: No scleral icterus.       Right eye: No discharge.        Left eye: No discharge.      Conjunctiva/sclera: Conjunctivae normal.  Neck:     Thyroid: No thyromegaly.  Cardiovascular:     Rate and Rhythm: Normal rate and regular rhythm.  Pulmonary:     Effort: No respiratory distress.     Breath sounds: Normal breath sounds. No wheezing.  Abdominal:     General: Bowel sounds are normal.     Palpations: Abdomen is soft.     Tenderness: There is no  abdominal tenderness.  Musculoskeletal:        General: No swelling or tenderness.     Cervical back: Neck supple. No tenderness.  Lymphadenopathy:     Cervical: No cervical adenopathy.  Skin:    Findings: No erythema or rash.  Neurological:     Mental Status: She is alert.  Psychiatric:        Mood and Affect: Mood normal.        Behavior: Behavior normal.      Outpatient Encounter Medications as of 09/04/2023  Medication Sig   Adalimumab 40 MG/0.8ML PNKT Inject 40 mg into the skin every 14 (fourteen) days. Humira   aspirin 325 MG tablet Take 325 mg by mouth daily.   busPIRone (BUSPAR) 5 MG tablet Take 1 tablet (5 mg total) by mouth daily.   citalopram (CELEXA) 40 MG tablet TAKE 1 TABLET BY MOUTH EVERY DAY   fluticasone (FLONASE) 50 MCG/ACT nasal spray Place 2 sprays into both nostrils daily.   folic acid (FOLVITE) 1 MG tablet Take 1 mg by mouth daily.   ipratropium (ATROVENT) 0.06 % nasal spray Place 2 sprays into both nostrils 4 (four) times daily.   methotrexate (RHEUMATREX) 2.5 MG tablet Take by mouth.   Semaglutide-Weight Management (WEGOVY) 0.25 MG/0.5ML SOAJ Inject 0.25 mg into the skin once a week.   No facility-administered encounter medications on file as of 09/04/2023.     Lab Results  Component Value Date   WBC 8.9 08/26/2023   HGB 13.5 08/26/2023   HCT 39.4 08/26/2023   PLT 409 (H) 08/26/2023   GLUCOSE 88 01/14/2022   CHOL 197 01/14/2022   TRIG 115.0 01/14/2022   HDL 53.50 01/14/2022   LDLCALC 121 (H) 01/14/2022   ALT 15 09/14/2019   AST 17 09/14/2019   NA 138 01/14/2022   K 4.3 01/14/2022    CL 105 01/14/2022   CREATININE 0.69 01/14/2022   BUN 10 01/14/2022   CO2 30 01/14/2022   TSH 1.399 02/17/2023    MM 3D SCREENING MAMMOGRAM BILATERAL BREAST  Result Date: 09/01/2023 CLINICAL DATA:  Screening. EXAM: DIGITAL SCREENING BILATERAL MAMMOGRAM WITH TOMOSYNTHESIS AND CAD TECHNIQUE: Bilateral screening digital craniocaudal and mediolateral oblique mammograms were obtained. Bilateral screening digital breast tomosynthesis was performed. The images were evaluated with computer-aided detection. COMPARISON:  Previous exam(s). ACR Breast Density Category b: There are scattered areas of fibroglandular density. FINDINGS: There are no findings suspicious for malignancy. IMPRESSION: No mammographic evidence of malignancy. A result letter of this screening mammogram will be mailed directly to the patient. RECOMMENDATION: Screening mammogram in one year. (Code:SM-B-01Y) BI-RADS CATEGORY  1: Negative. Electronically Signed   By: Baird Lyons M.D.   On: 09/01/2023 16:10       Assessment & Plan:  Rheumatoid arthritis, involving unspecified site, unspecified whether rheumatoid factor present [facility]) Assessment & Plan: Continue humira and MTX.  Doing well.  Overall relatively stable. Continue f/u with Dr Allena Katz   Orders: -     Basic metabolic panel -     Hepatic function panel  Hypercholesteremia Assessment & Plan: Low cholesterol diet and exercise.  Follow lipid panel.   Orders: -     Lipid panel  Colon cancer screening Assessment & Plan: cologuard 10/2020 - negative.    Leukocytosis, unspecified type Assessment & Plan: Recently evaluated by Dr Cathie Hoops for lymphocytosis. Felt her leucocytosis (predominantly lymphocytosis) -most likely reactive secondary to her autoimmune disease/RF.  No M protein on SPEP. Discharged from hematology.     Stress  Assessment & Plan: Overall appears to be handling things relatively well.  Doing well on citalopram.  Discussed increased stress.  Will have her  decrease her citalopram back down to 40mg  q day.  Start buspar 5mg  q day.  Follow.    Weight loss counseling, encounter for Assessment & Plan: Discussed diet and exercise.  Discussed medication.  Discussed GLP 1 agonist and possible side effects.  No history of gallbladder issues, pancreatitis.  No family history of medullary thyroid cancer, etc. She desires to start GLP 1 agonist.  ZOXWRU .25mg .  Follow.     Other orders -     busPIRone HCl; Take 1 tablet (5 mg total) by mouth daily.  Dispense: 30 tablet; Refill: 2 -     Wegovy; Inject 0.25 mg into the skin once a week.  Dispense: 2 mL; Refill: 2     Dale Whiteside, MD

## 2023-09-08 ENCOUNTER — Other Ambulatory Visit (HOSPITAL_COMMUNITY): Payer: Self-pay

## 2023-09-08 NOTE — Telephone Encounter (Signed)
Patient needs PA for [facility]

## 2023-09-10 ENCOUNTER — Telehealth: Payer: Self-pay

## 2023-09-10 ENCOUNTER — Other Ambulatory Visit (HOSPITAL_COMMUNITY): Payer: Self-pay

## 2023-09-10 NOTE — Telephone Encounter (Signed)
Pharmacy Patient Advocate Encounter   Received notification from Patient Advice Request messages that prior authorization for [facility] is required/requested.   Insurance verification completed.   The patient is insured through [facility] .   Per test claim: PA required; PA started via CoverMyMeds. KEY [id number] . Waiting for clinical questions to populate.   PT ALSO HAS MEDICAID BUT PA MUST BE SUBMITTED TO PRIMARY INSURANCE FIRST

## 2023-09-10 NOTE — Telephone Encounter (Signed)
PA has been submitted and documented in separate encounter, please sign off on rx in this encounter as PA team is unable to resolve PT advice requests. Thank you

## 2023-09-12 NOTE — Telephone Encounter (Signed)
Clinical questions answered and submitted

## 2023-09-16 NOTE — Telephone Encounter (Signed)
Pharmacy Patient Advocate Encounter  Received notification from [facility] that Prior Authorization for [facility] has been DENIED.  Full denial letter will be uploaded to the media tab. See denial reason below.    PA #/Case ID/Reference #: [id number]

## 2023-09-27 ENCOUNTER — Other Ambulatory Visit: Payer: Self-pay | Admitting: Internal Medicine

## 2023-09-28 ENCOUNTER — Other Ambulatory Visit: Payer: Self-pay | Admitting: Internal Medicine

## 2023-09-29 ENCOUNTER — Telehealth: Payer: Self-pay

## 2023-09-29 NOTE — Telephone Encounter (Signed)
This is not a medication that we continue on a regular basis.  Need to clarify if having persistent symptoms.

## 2023-09-29 NOTE — Telephone Encounter (Signed)
Patient states she wasn't able to message Rita Ohara, LPN.  Patient states she needs a refill on her busPIRone (BUSPAR) 5 MG tablet.  Patient states she would like for Bethann Berkshire to please call her from 11am-12pm, during her lunch, to discuss Wegovy.  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  Patient states her insurance won't pay for the [facility], so she has some questions.  Prescription Request  09/29/2023  LOV: Visit date not found  What is the name of the medication or equipment? busPIRone (BUSPAR) 5 MG tablet  Have you contacted your pharmacy to request a refill? Yes   Which pharmacy would you like this sent to?  [id number] [place] - [address] [place] [address] Phone: [phone] Fax: [phone]    Patient notified that their request is being sent to the clinical staff for review and that they should receive a response within 2 business days.   Please advise at Mobile [phone] (mobile)

## 2023-09-30 NOTE — Telephone Encounter (Signed)
Buspar has been refilled

## 2023-10-08 DIAGNOSIS — M150 Primary generalized (osteo)arthritis: Secondary | ICD-10-CM | POA: Diagnosis not present

## 2023-10-08 DIAGNOSIS — M0609 Rheumatoid arthritis without rheumatoid factor, multiple sites: Secondary | ICD-10-CM | POA: Diagnosis not present

## 2023-10-08 DIAGNOSIS — Z79899 Other long term (current) drug therapy: Secondary | ICD-10-CM | POA: Diagnosis not present

## 2023-10-14 ENCOUNTER — Telehealth: Payer: Self-pay

## 2023-10-14 NOTE — Telephone Encounter (Signed)
Patient states she would like for Rita Ohara, LPN, to know that her [employer] has said that they will accept her [facility].  Patient states they are accepting it as of 06/29/2023.  Patient states [employer] [facility] would not accept her Wegovy.  Patient states Bethann Berkshire may call her if she has any questions.

## 2023-10-15 NOTE — Telephone Encounter (Signed)
Patient needs PA on Wegovy submitted through [facility] instead of [address]

## 2023-10-20 ENCOUNTER — Telehealth: Payer: Self-pay

## 2023-10-20 ENCOUNTER — Other Ambulatory Visit (HOSPITAL_COMMUNITY): Payer: Self-pay

## 2023-10-20 NOTE — Telephone Encounter (Signed)
Pharmacy Patient Advocate Encounter   Received notification from Pt Calls Messages that prior authorization for Wegovy 0.25MG /0.5ML auto-injectors is required/requested.   Insurance verification completed.   The patient is insured through [facility] .  Per test claim: PA required; PA submitted to above mentioned insurance via Fax Key/confirmation #/EOC --- Status is pending    Submitted PA via CMM but it was cancelled: Psychologist, counselling To Scientist, research (medical). Resubmitted through fax.

## 2023-10-20 NOTE — Telephone Encounter (Signed)
PA request has been Submitted. New Encounter created for follow up. For additional info see Pharmacy Prior Auth telephone encounter from 10/20/23.

## 2023-10-24 ENCOUNTER — Telehealth: Payer: 59 | Admitting: Family Medicine

## 2023-10-24 DIAGNOSIS — B9689 Other specified bacterial agents as the cause of diseases classified elsewhere: Secondary | ICD-10-CM

## 2023-10-24 DIAGNOSIS — J019 Acute sinusitis, unspecified: Secondary | ICD-10-CM | POA: Diagnosis not present

## 2023-10-24 MED ORDER — DOXYCYCLINE HYCLATE 100 MG PO TABS: 100.0000 mg | ORAL_TABLET | Freq: Two times a day (BID) | ORAL | 0 refills | Status: AC

## 2023-10-24 MED ORDER — PREDNISONE 10 MG (21) PO TBPK: ORAL_TABLET | ORAL | 0 refills | Status: DC

## 2023-10-24 NOTE — Progress Notes (Signed)
Virtual Visit Consent   Robin Kelly, you are scheduled for a virtual visit with a [facility] provider today. Just as with appointments in the office, your consent must be obtained to participate. Your consent will be active for this visit and any virtual visit you may have with one of our providers in the next 365 days. If you have a MyChart account, a copy of this consent can be sent to you electronically.  As this is a virtual visit, video technology does not allow for your provider to perform a traditional examination. This may limit your provider's ability to fully assess your condition. If your provider identifies any concerns that need to be evaluated in person or the need to arrange testing (such as labs, EKG, etc.), we will make arrangements to do so. Although advances in technology are sophisticated, we cannot ensure that it will always work on either your end or our end. If the connection with a video visit is poor, the visit may have to be switched to a telephone visit. With either a video or telephone visit, we are not always able to ensure that we have a secure connection.  By engaging in this virtual visit, you consent to the provision of healthcare and authorize for your insurance to be billed (if applicable) for the services provided during this visit. Depending on your insurance coverage, you may receive a charge related to this service.  I need to obtain your verbal consent now. Are you willing to proceed with your visit today? Robin Kelly has provided verbal consent on 10/24/2023 for a virtual visit (video or telephone). Reed Pandy, [place]  Date: 10/24/2023 7:33 PM  Virtual Visit via Video Note   IReed Pandy, connected with  Robin Kelly  ([id number], 1966/10/25) on 10/24/23 at  7:30 PM EST by a video-enabled telemedicine application and verified that I am speaking with the correct person using two identifiers.  Location: Patient: Virtual Visit Location Patient:  Home Provider: Virtual Visit Location Provider: Home Office   I discussed the limitations of evaluation and management by telemedicine and the availability of in person appointments. The patient expressed understanding and agreed to proceed.    History of Present Illness: Robin Kelly is a 57 y.o. who identifies as a female who was assigned female at birth, and is being seen today for c/o having a head cold for almost three weeks. Pt states has been using nasal spray for three weeks, but has only helped with being able to breath better.  Pt states this week she started to have green mucus from her nose, sinus pressure and also has a headache.  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   HPI: HPI  Problems:  Patient Active Problem List   Diagnosis Date Noted   Hypercholesteremia 09/04/2023   Weight loss counseling, encounter for 09/04/2023   Sinusitis 07/03/2023   Vaginitis 05/26/2022   Encounter for completion of form with patient 01/14/2022   Breast cancer screening 08/05/2021   Rash 08/21/2019   Colon cancer screening 08/21/2019   Cervical cancer screening 08/21/2019   Stress 04/05/2019   Leukocytosis 12/13/2018   Rheumatoid arthritis (HCC) 12/13/2018   Elevated blood pressure reading 12/13/2018   Thrombocytosis 10/13/2017   Seronegative arthritis 05/14/2017   Health care maintenance 04/20/2017   Left knee pain 03/09/2017   Pain of right heel 09/15/2016   Anemia 03/14/2016   Status post abdominal hysterectomy 03/12/2016   Status post hysterectomy 03/12/2016   Menorrhagia  with irregular cycle 02/06/2016   Endometrial polyp 02/06/2016   Fibroids 02/06/2016    Allergies:  Allergies  Allergen Reactions   Penicillins Swelling   Oxycodone Palpitations   Medications:  Current Outpatient Medications:    doxycycline (VIBRA-TABS) 100 MG tablet, Take 1 tablet (100 mg total) by mouth 2 (two) times daily for 7 days., Disp: 14 tablet, Rfl: 0   predniSONE (STERAPRED UNI-PAK 21 TAB) 10 MG  (21) TBPK tablet, Take following package directions., Disp: 21 tablet, Rfl: 0   Adalimumab 40 MG/0.8ML PNKT, Inject 40 mg into the skin every 14 (fourteen) days. Humira, Disp: , Rfl:    aspirin 325 MG tablet, Take 325 mg by mouth daily., Disp: , Rfl:    busPIRone (BUSPAR) 5 MG tablet, TAKE 1 TABLET (5 MG TOTAL) BY MOUTH DAILY., Disp: 90 tablet, Rfl: 1   citalopram (CELEXA) 40 MG tablet, TAKE 1 TABLET BY MOUTH EVERY DAY, Disp: 90 tablet, Rfl: 1   fluticasone (FLONASE) 50 MCG/ACT nasal spray, Place 2 sprays into both nostrils daily., Disp: 16 g, Rfl: 0   folic acid (FOLVITE) 1 MG tablet, Take 1 mg by mouth daily., Disp: , Rfl:    ipratropium (ATROVENT) 0.06 % nasal spray, Place 2 sprays into both nostrils 4 (four) times daily., Disp: 15 mL, Rfl: 12   methotrexate (RHEUMATREX) 2.5 MG tablet, Take by mouth., Disp: , Rfl:    Semaglutide-Weight Management (WEGOVY) 0.25 MG/0.5ML SOAJ, Inject 0.25 mg into the skin once a week., Disp: 2 mL, Rfl: 2  Observations/Objective: Patient is well-developed, well-nourished in no acute distress.  Resting comfortably at home.  Head is normocephalic, atraumatic.  No labored breathing.  Speech is clear and coherent with logical content.  Patient is alert and oriented at baseline.    Assessment and Plan: 1. Acute bacterial sinusitis - doxycycline (VIBRA-TABS) 100 MG tablet; Take 1 tablet (100 mg total) by mouth 2 (two) times daily for 7 days.  Dispense: 14 tablet; Refill: 0 - predniSONE (STERAPRED UNI-PAK 21 TAB) 10 MG (21) TBPK tablet; Take following package directions.  Dispense: 21 tablet; Refill: 0  -Pt to follow up with PCP or urgent care for worsening symptoms.   Follow Up Instructions: I discussed the assessment and treatment plan with the patient. The patient was provided an opportunity to ask questions and all were answered. The patient agreed with the plan and demonstrated an understanding of the instructions.  A copy of instructions were sent to the  patient via MyChart unless otherwise noted below.     The patient was advised to call back or seek an in-person evaluation if the symptoms worsen or if the condition fails to improve as anticipated.    Reed Pandy, PA-C

## 2023-10-24 NOTE — Patient Instructions (Signed)
[name], thank you for joining Reed Pandy, PA-C for today's virtual visit.  While this provider is not your primary care provider (PCP), if your PCP is located in our provider database this encounter information will be shared with them immediately following your visit.   A [facility] MyChart account gives you access to today's visit and all your visits, tests, and labs performed at [facility] " click here if you don't have a [facility] MyChart account or go to [email or web address]  Consent: (Patient) Robin Kelly provided verbal consent for this virtual visit at the beginning of the encounter.  Current Medications:  Current Outpatient Medications:    doxycycline (VIBRA-TABS) 100 MG tablet, Take 1 tablet (100 mg total) by mouth 2 (two) times daily for 7 days., Disp: 14 tablet, Rfl: 0   predniSONE (STERAPRED UNI-PAK 21 TAB) 10 MG (21) TBPK tablet, Take following package directions., Disp: 21 tablet, Rfl: 0   Adalimumab 40 MG/0.8ML PNKT, Inject 40 mg into the skin every 14 (fourteen) days. Humira, Disp: , Rfl:    aspirin 325 MG tablet, Take 325 mg by mouth daily., Disp: , Rfl:    busPIRone (BUSPAR) 5 MG tablet, TAKE 1 TABLET (5 MG TOTAL) BY MOUTH DAILY., Disp: 90 tablet, Rfl: 1   citalopram (CELEXA) 40 MG tablet, TAKE 1 TABLET BY MOUTH EVERY DAY, Disp: 90 tablet, Rfl: 1   fluticasone (FLONASE) 50 MCG/ACT nasal spray, Place 2 sprays into both nostrils daily., Disp: 16 g, Rfl: 0   folic acid (FOLVITE) 1 MG tablet, Take 1 mg by mouth daily., Disp: , Rfl:    ipratropium (ATROVENT) 0.06 % nasal spray, Place 2 sprays into both nostrils 4 (four) times daily., Disp: 15 mL, Rfl: 12   methotrexate (RHEUMATREX) 2.5 MG tablet, Take by mouth., Disp: , Rfl:    Semaglutide-Weight Management (WEGOVY) 0.25 MG/0.5ML SOAJ, Inject 0.25 mg into the skin once a week., Disp: 2 mL, Rfl: 2   Medications ordered in this encounter:  Meds ordered this encounter  Medications   doxycycline  (VIBRA-TABS) 100 MG tablet    Sig: Take 1 tablet (100 mg total) by mouth 2 (two) times daily for 7 days.    Dispense:  14 tablet    Refill:  0   predniSONE (STERAPRED UNI-PAK 21 TAB) 10 MG (21) TBPK tablet    Sig: Take following package directions.    Dispense:  21 tablet    Refill:  0    Please dispense one standard blister pack taper.     *If you need refills on other medications prior to your next appointment, please contact your pharmacy*  Follow-Up: Call back or seek an in-person evaluation if the symptoms worsen or if the condition fails to improve as anticipated.  [facility] [phone]  Other Instructions Sinus Infection, Adult A sinus infection, also called sinusitis, is inflammation of your sinuses. Sinuses are hollow spaces in the bones around your face. Your sinuses are located: Around your eyes. In the middle of your forehead. Behind your nose. In your cheekbones. Mucus normally drains out of your sinuses. When your nasal tissues become inflamed or swollen, mucus can become trapped or blocked. This allows bacteria, viruses, and fungi to grow, which leads to infection. Most infections of the sinuses are caused by a virus. A sinus infection can develop quickly. It can last for up to 4 weeks (acute) or for more than 12 weeks (chronic). A sinus infection often develops after a cold.  What are the causes? This condition is caused by anything that creates swelling in the sinuses or stops mucus from draining. This includes: Allergies. Asthma. Infection from bacteria or viruses. Deformities or blockages in your nose or sinuses. Abnormal growths in the nose (nasal polyps). Pollutants, such as chemicals or irritants in the air. Infection from fungi. This is rare. What increases the risk? You are more likely to develop this condition if you: Have a weak body defense system (immune system). Do a lot of swimming or diving. Overuse nasal sprays. Smoke. What  are the signs or symptoms? The main symptoms of this condition are pain and a feeling of pressure around the affected sinuses. Other symptoms include: Stuffy nose or congestion that makes it difficult to breathe through your nose. Thick yellow or greenish drainage from your nose. Tenderness, swelling, and warmth over the affected sinuses. A cough that may get worse at night. Decreased sense of smell and taste. Extra mucus that collects in the throat or the back of the nose (postnasal drip) causing a sore throat or bad breath. Tiredness (fatigue). Fever. How is this diagnosed? This condition is diagnosed based on: Your symptoms. Your medical history. A physical exam. Tests to find out if your condition is acute or chronic. This may include: Checking your nose for nasal polyps. Viewing your sinuses using a device that has a light (endoscope). Testing for allergies or bacteria. Imaging tests, such as an MRI or CT scan. In rare cases, a bone biopsy may be done to rule out more serious types of fungal sinus disease. How is this treated? Treatment for a sinus infection depends on the cause and whether your condition is chronic or acute. If caused by a virus, your symptoms should go away on their own within 10 days. You may be given medicines to relieve symptoms. They include: Medicines that shrink swollen nasal passages (decongestants). A spray that eases inflammation of the nostrils (topical intranasal corticosteroids). Rinses that help get rid of thick mucus in your nose (nasal saline washes). Medicines that treat allergies (antihistamines). Over-the-counter pain relievers. If caused by bacteria, your health care provider may recommend waiting to see if your symptoms improve. Most bacterial infections will get better without antibiotic medicine. You may be given antibiotics if you have: A severe infection. A weak immune system. If caused by narrow nasal passages or nasal polyps, surgery  may be needed. Follow these instructions at home: Medicines Take, use, or apply over-the-counter and prescription medicines only as told by your health care provider. These may include nasal sprays. If you were prescribed an antibiotic medicine, take it as told by your health care provider. Do not stop taking the antibiotic even if you start to feel better. Hydrate and humidify  Drink enough fluid to keep your urine pale yellow. Staying hydrated will help to thin your mucus. Use a cool mist humidifier to keep the humidity level in your home above 50%. Inhale steam for 10-15 minutes, 3-4 times a day, or as told by your health care provider. You can do this in the bathroom while a hot shower is running. Limit your exposure to cool or dry air. Rest Rest as much as possible. Sleep with your head raised (elevated). Make sure you get enough sleep each night. General instructions  Apply a warm, moist washcloth to your face 3-4 times a day or as told by your health care provider. This will help with discomfort. Use nasal saline washes as often as told by your  health care provider. Wash your hands often with soap and water to reduce your exposure to germs. If soap and water are not available, use hand sanitizer. Do not smoke. Avoid being around people who are smoking (secondhand smoke). Keep all follow-up visits. This is important. Contact a health care provider if: You have a fever. Your symptoms get worse. Your symptoms do not improve within 10 days. Get help right away if: You have a severe headache. You have persistent vomiting. You have severe pain or swelling around your face or eyes. You have vision problems. You develop confusion. Your neck is stiff. You have trouble breathing. These symptoms may be an emergency. Get help right away. Call 911. Do not wait to see if the symptoms will go away. Do not drive yourself to the hospital. Summary A sinus infection is soreness and  inflammation of your sinuses. Sinuses are hollow spaces in the bones around your face. This condition is caused by nasal tissues that become inflamed or swollen. The swelling traps or blocks the flow of mucus. This allows bacteria, viruses, and fungi to grow, which leads to infection. If you were prescribed an antibiotic medicine, take it as told by your health care provider. Do not stop taking the antibiotic even if you start to feel better. Keep all follow-up visits. This is important. This information is not intended to replace advice given to you by your health care provider. Make sure you discuss any questions you have with your health care provider. Document Revised: 10/16/2021 Document Reviewed: 10/16/2021 Elsevier Patient Education  2024 Elsevier Inc.    If you have been instructed to have an in-person evaluation today at a local Urgent Care facility, please use the link below. It will take you to a list of all of our available [facility], including address, phone number and hours of operation. Please do not delay care.  [facility]  If you or a family member do not have a primary care provider, use the link below to schedule a visit and establish care. When you choose a [facility] primary care physician or advanced practice provider, you gain a long-term partner in health. Find a Primary Care Provider  Learn more about [facility]'s in-office and virtual care options: [facility] - Get Care Now

## 2023-10-25 ENCOUNTER — Other Ambulatory Visit: Payer: Self-pay | Admitting: Internal Medicine

## 2023-10-29 ENCOUNTER — Other Ambulatory Visit (HOSPITAL_COMMUNITY): Payer: Self-pay

## 2023-10-29 NOTE — Telephone Encounter (Signed)
Pharmacy Patient Advocate Encounter  Received notification from [facility] that Prior Authorization for Reginal Lutes has been DENIED.  See denial reason below. No denial letter attached in CMM. Will attach denial letter to Media tab once received.   Placed a call to [facility], per the representative, Reginal Lutes has been denied. It was denied because it is not covered by the primary plan (plan exclusion).  Phone# [phone]

## 2023-12-10 ENCOUNTER — Ambulatory Visit: Payer: Medicaid Other | Admitting: Internal Medicine

## 2023-12-10 VITALS — BP 108/70 | HR 87 | Temp 98.0°F | Resp 16 | Ht 61.0 in | Wt 192.2 lb

## 2023-12-10 DIAGNOSIS — M069 Rheumatoid arthritis, unspecified: Secondary | ICD-10-CM

## 2023-12-10 DIAGNOSIS — F439 Reaction to severe stress, unspecified: Secondary | ICD-10-CM

## 2023-12-10 DIAGNOSIS — E7800 Pure hypercholesterolemia, unspecified: Secondary | ICD-10-CM

## 2023-12-10 DIAGNOSIS — Z1231 Encounter for screening mammogram for malignant neoplasm of breast: Secondary | ICD-10-CM

## 2023-12-10 DIAGNOSIS — D72829 Elevated white blood cell count, unspecified: Secondary | ICD-10-CM

## 2023-12-10 DIAGNOSIS — Z713 Dietary counseling and surveillance: Secondary | ICD-10-CM | POA: Diagnosis not present

## 2023-12-10 NOTE — Progress Notes (Signed)
Subjective:    Patient ID: Robin Kelly, female    DOB: 1965-12-03, 58 y.o.   MRN: [id number]  Patient here for  Chief Complaint  Patient presents with   Medical Management of Chronic Issues    HPI Here for a scheduled follow up - f/u regarding increased stress and RA.  Sees Dr Allena Katz for her RA.  Taking humira and methotrexate. Stable. Recently evaluated by Dr Cathie Hoops for lymphocytosis. Felt her leucocytosis (predominantly lymphocytosis) -most likely reactive secondary to her autoimmune disease/RF.  No M protein on SPEP. Discharged from hematology. Remains on citalopram. Started buspar last visit. Feels doing well on this regimen. Does not feel needs any further intervention. Also prescribed wegovy last visit. Trying to watch her diet. Discussed diet and exercise.    Past Medical History:  Diagnosis Date   Allergy    Anemia    Anxiety    due to heavy bleeding and SOB   Arthritis    Cancer (HCC) 2004   melanoma   Heavy menstrual bleeding    Melanoma (HCC) 2004   abdomen, txted by Dr. Darvin Neighbours   Rheumatoid arthritis (HCC)    Seasonal allergies    Shortness of breath dyspnea    "due to anemia"   Past Surgical History:  Procedure Laterality Date   ABDOMINAL HYSTERECTOMY     APPENDECTOMY  2001   right tube and ovary removed also.   BREAST BIOPSY Right 07/28/2019   [place] bx, - BENIGN MAMMARY PARENCHYMA WITH FIBROCYSTIC    CESAREAN SECTION     x2   CHOLECYSTECTOMY     CYSTOSCOPY N/A 03/12/2016   Procedure: CYSTOSCOPY;  Surgeon: Conard Novak, MD;  Location: [facility] ORS;  Service: Gynecology;  Laterality: N/A;   DILATATION & CURETTAGE/HYSTEROSCOPY WITH MYOSURE N/A 02/06/2016   Procedure: DILATATION & CURETTAGE HYSTEROSCOPY  polypectomy;  Surgeon: Conard Novak, MD;  Location: [facility] OR[facility];  Service: Gynecology;  Laterality: N/A;   POLYPECTOMY N/A 02/06/2016   Procedure: POLYPECTOMY;  Surgeon: Conard Novak, MD;  Location: [facility] OR[facility];  Service: Gynecology;  Laterality: N/A;    rheumatoid arthritis     ROBOTIC ASSISTED TOTAL HYSTERECTOMY WITH SALPINGECTOMY Bilateral 03/12/2016   Converted to total abdominal hysterectom   SALPINGOOPHORECTOMY Right    during appendectomy.   Family History  Problem Relation Age of Onset   Hypertension Mother    Hypertension Father    Heart failure Father    Cancer Father    Diabetes Paternal Grandmother    Breast cancer Neg Hx    Social History   Socioeconomic History   Marital status: Widowed    Spouse name: Not on file   Number of children: Not on file   Years of education: Not on file   Highest education level: Some college, no degree  Occupational History   Not on file  Tobacco Use   Smoking status: Never   Smokeless tobacco: Never  Vaping Use   Vaping status: Never Used  Substance and Sexual Activity   Alcohol use: No   Drug use: No   Sexual activity: Not on file  Other Topics Concern   Not on file  Social History Narrative   Not on file   Social Drivers of Health   Financial Resource Strain: Low Risk  (06/30/2023)   Overall Financial Resource Strain (CARDIA)    Difficulty of Paying Living Expenses: Not very hard  Food Insecurity: No Food Insecurity (06/30/2023)   Hunger Vital Sign  Worried About Programme researcher, broadcasting/film/video in the Last Year: Never true    Ran Out of Food in the Last Year: Never true  Transportation Needs: No Transportation Needs (06/30/2023)   PRAPARE - Administrator, Civil Service (Medical): No    Lack of Transportation (Non-Medical): No  Physical Activity: Insufficiently Active (06/30/2023)   Exercise Vital Sign    Days of Exercise per Week: 3 days    Minutes of Exercise per Session: 30 min  Stress: No Stress Concern Present (06/30/2023)   [employer] of Occupational Health - Occupational Stress Questionnaire    Feeling of Stress : Only a little  Social Connections: Moderately Integrated (06/30/2023)   Social Connection and Isolation Panel [NHANES]    Frequency of  Communication with Friends and Family: More than three times a week    Frequency of Social Gatherings with Friends and Family: Three times a week    Attends Religious Services: More than 4 times per year    Active Member of Clubs or Organizations: Yes    Attends Banker Meetings: 1 to 4 times per year    Marital Status: Widowed     Review of Systems  Constitutional:  Negative for appetite change and unexpected weight change.  HENT:  Negative for congestion and sinus pressure.   Respiratory:  Negative for cough, chest tightness and shortness of breath.   Cardiovascular:  Negative for chest pain and palpitations.  Gastrointestinal:  Negative for abdominal pain, diarrhea, nausea and vomiting.  Genitourinary:  Negative for difficulty urinating and dysuria.  Musculoskeletal:  Negative for joint swelling and myalgias.  Skin:  Negative for color change and rash.  Neurological:  Negative for dizziness and headaches.  Psychiatric/Behavioral:  Negative for agitation and dysphoric mood.        Objective:     BP 108/70   Pulse 87   Temp 98 F (36.7 C)   Resp 16   Ht 5\' 1"  (1.549 m)   Wt 192 lb 3.2 oz (87.2 kg)   LMP 10/23/2015 Comment: bleeding since 10/23/15  SpO2 98%   BMI 36.32 kg/m  Wt Readings from Last 3 Encounters:  12/10/23 192 lb 3.2 oz (87.2 kg)  09/04/23 185 lb (83.9 kg)  09/01/23 186 lb 11.2 oz (84.7 kg)    Physical Exam Vitals reviewed.  Constitutional:      General: She is not in acute distress.    Appearance: Normal appearance.  HENT:     Head: Normocephalic and atraumatic.     Right Ear: External ear normal.     Left Ear: External ear normal.     Mouth/Throat:     Pharynx: No oropharyngeal exudate or posterior oropharyngeal erythema.  Eyes:     General: No scleral icterus.       Right eye: No discharge.        Left eye: No discharge.     Conjunctiva/sclera: Conjunctivae normal.  Neck:     Thyroid: No thyromegaly.  Cardiovascular:     Rate  and Rhythm: Normal rate and regular rhythm.  Pulmonary:     Effort: No respiratory distress.     Breath sounds: Normal breath sounds. No wheezing.  Abdominal:     General: Bowel sounds are normal.     Palpations: Abdomen is soft.     Tenderness: There is no abdominal tenderness.  Musculoskeletal:        General: No swelling or tenderness.     Cervical back: Neck  supple. No tenderness.  Lymphadenopathy:     Cervical: No cervical adenopathy.  Skin:    Findings: No erythema or rash.  Neurological:     Mental Status: She is alert.  Psychiatric:        Mood and Affect: Mood normal.        Behavior: Behavior normal.         Outpatient Encounter Medications as of 12/10/2023  Medication Sig   Adalimumab 40 MG/0.8ML PNKT Inject 40 mg into the skin every 14 (fourteen) days. Humira   aspirin 325 MG tablet Take 325 mg by mouth daily.   busPIRone (BUSPAR) 5 MG tablet TAKE 1 TABLET (5 MG TOTAL) BY MOUTH DAILY.   citalopram (CELEXA) 40 MG tablet TAKE 1 TABLET BY MOUTH EVERY DAY   fluticasone (FLONASE) 50 MCG/ACT nasal spray Place 2 sprays into both nostrils daily.   folic acid (FOLVITE) 1 MG tablet Take 1 mg by mouth daily.   ipratropium (ATROVENT) 0.06 % nasal spray Place 2 sprays into both nostrils 4 (four) times daily.   methotrexate (RHEUMATREX) 2.5 MG tablet Take by mouth.   Semaglutide-Weight Management (WEGOVY) 0.25 MG/0.5ML SOAJ Inject 0.25 mg into the skin once a week.   [DISCONTINUED] predniSONE (STERAPRED UNI-PAK 21 TAB) 10 MG (21) TBPK tablet Take following package directions.   No facility-administered encounter medications on file as of 12/10/2023.     Lab Results  Component Value Date   WBC 8.9 08/26/2023   HGB 13.5 08/26/2023   HCT 39.4 08/26/2023   PLT 409 (H) 08/26/2023   GLUCOSE 88 09/04/2023   CHOL 168 09/04/2023   TRIG 113.0 09/04/2023   HDL 42.80 09/04/2023   LDLCALC 103 (H) 09/04/2023   ALT 17 09/04/2023   AST 17 09/04/2023   NA 139 09/04/2023   K 4.1  09/04/2023   CL 106 09/04/2023   CREATININE 0.65 09/04/2023   BUN 14 09/04/2023   CO2 30 09/04/2023   TSH 1.399 02/17/2023    MM 3D SCREENING MAMMOGRAM BILATERAL BREAST Result Date: 09/01/2023 CLINICAL DATA:  Screening. EXAM: DIGITAL SCREENING BILATERAL MAMMOGRAM WITH TOMOSYNTHESIS AND CAD TECHNIQUE: Bilateral screening digital craniocaudal and mediolateral oblique mammograms were obtained. Bilateral screening digital breast tomosynthesis was performed. The images were evaluated with computer-aided detection. COMPARISON:  Previous exam(s). ACR Breast Density Category b: There are scattered areas of fibroglandular density. FINDINGS: There are no findings suspicious for malignancy. IMPRESSION: No mammographic evidence of malignancy. A result letter of this screening mammogram will be mailed directly to the patient. RECOMMENDATION: Screening mammogram in one year. (Code:SM-B-01Y) BI-RADS CATEGORY  1: Negative. Electronically Signed   By: Baird Lyons M.D.   On: 09/01/2023 16:10       Assessment & Plan:  Hypercholesteremia -     Basic metabolic panel; Future -     Hepatic function panel; Future -     Lipid panel; Future  Leukocytosis, unspecified type -     CBC with Differential/Platelet; Future     Dale Yreka, MD

## 2023-12-14 ENCOUNTER — Encounter: Payer: Self-pay | Admitting: Internal Medicine

## 2023-12-14 NOTE — Assessment & Plan Note (Signed)
Continue humira and MTX.  Doing well.  Overall relatively stable. Continue f/u with Dr Allena Katz

## 2023-12-14 NOTE — Assessment & Plan Note (Signed)
Low cholesterol diet and exercise.  Follow lipid panel.   

## 2023-12-14 NOTE — Assessment & Plan Note (Signed)
cologuard 10/2020 - negative.

## 2023-12-14 NOTE — Assessment & Plan Note (Signed)
Mammogram 08/28/23 - Birads I.

## 2023-12-14 NOTE — Assessment & Plan Note (Signed)
Overall appears to be handling things relatively well.  Doing well on citalopram and buspar.  Follow.

## 2023-12-14 NOTE — Assessment & Plan Note (Signed)
Discussed diet and exercise. Discussed wegovy last visit. Continue low carb diet and exercise.  Will follow.

## 2023-12-14 NOTE — Assessment & Plan Note (Signed)
Evaluated by Dr Cathie Hoops for lymphocytosis. Felt her leucocytosis (predominantly lymphocytosis) -most likely reactive secondary to her autoimmune disease/RF.  No M protein on SPEP. Discharged from hematology.  Follow cbc.

## 2023-12-17 ENCOUNTER — Telehealth: Payer: Self-pay

## 2023-12-17 NOTE — Telephone Encounter (Signed)
Copied from CRM [id number]. Topic: General - Other >> Dec 17, 2023 11:10 AM [name] wrote: Reason for CRM: Patient is requesting for Robin Kelly to call her back.

## 2023-12-21 ENCOUNTER — Telehealth: Payer: Medicaid Other | Admitting: Family Medicine

## 2023-12-21 DIAGNOSIS — W57XXXA Bitten or stung by nonvenomous insect and other nonvenomous arthropods, initial encounter: Secondary | ICD-10-CM

## 2023-12-21 DIAGNOSIS — L089 Local infection of the skin and subcutaneous tissue, unspecified: Secondary | ICD-10-CM | POA: Diagnosis not present

## 2023-12-21 MED ORDER — CEPHALEXIN 500 MG PO CAPS: 500.0000 mg | ORAL_CAPSULE | Freq: Three times a day (TID) | ORAL | 0 refills | Status: AC

## 2023-12-21 NOTE — Patient Instructions (Signed)
Insect Bite, Adult  An insect bite can make your skin red, itchy, and swollen. An insect bite is different from an insect sting, which happens when an insect injects poison (venom) into the skin.  Some insects can spread disease to people through a bite. However, most insect bites do not lead to disease and are not serious.  What are the causes?  Insects may bite for a variety of reasons, including:  Hunger.  To defend themselves.  Insects that bite include:  Spiders.  Mosquitoes and flies.  Ticks and fleas.  Ants.  Kissing bugs.  Chiggers.  What are the signs or symptoms?  In many cases, symptoms last for 2-4 days. However, itching can last up to 10 days. Symptoms include:  Itching or pain in the bite area.  Redness and swelling in the bite area.  An open wound (skin ulcer).  In rare cases, a person may have a severe allergic reaction (anaphylactic reaction) to a bite. Symptoms of an anaphylactic reaction may include:  Feeling warm in the face (flushed). This may include redness.  Itchy, red, swollen areas of skin (hives).  Swelling of the eyes, lips, face, mouth, tongue, or throat.  Wheezing or difficulty breathing, speaking, or swallowing.  Dizziness, light-headedness, or fainting.  Abdominal symptoms like cramping, nausea, vomiting, or diarrhea.  How is this diagnosed?  This condition is usually diagnosed based on symptoms and a physical exam. During the exam, your health care provider will look at the bite and ask you what kind of insect bit you.  How is this treated?  Most insect bites are not serious. Symptoms often go away on their own and treatment is not usually needed. When treatment is recommended, it may include:  Applying ice to the affected area.  Applying steroid or other anti-itch creams, like calamine lotion, to the bite area.  Medicines called antihistamines to reduce itching.  You may also need:  A tetanus shot if you are not up to date.  Antibiotic cream or an oral antibiotic if the bite becomes  infected (this is uncommon).  Follow these instructions at home:  Bite area care    Do not scratch the bite area. It may help to cover the bite area with a bandage or close-fitting clothing.  Keep the bite area clean and dry. Wash it every day with soap and water as told by your health care provider.  Check the bite area every day for signs of infection. Check for:  More redness, swelling, or pain.  Fluid or blood.  Warmth.  Pus or a bad smell.  Managing pain, itching, and swelling    You may apply cortisone cream, calamine lotion, or a paste made of baking soda and water to the bite area as told by your health care provider.  If directed, put ice on the bite area. To do this:  Put ice in a plastic bag.  Place a towel between your skin and the bag.  Leave the ice on for 20 minutes, 2-3 times a day.  If your skin turns bright red, remove the ice right away to prevent skin damage. The risk of skin damage is higher if you cannot feel pain, heat, or cold.  General instructions  Apply or take over-the-counter and prescription medicine only as told by your health care provider.  If you were prescribed antibiotics, take or apply them as told by your health care provider. Do not stop using the antibiotic even if you start to feel  better.  How is this prevented?  To help reduce your risk of insect bites:  When you are outdoors, wear clothing that covers your arms and legs. This is especially important in the early morning and evening.  Use insect repellent. The best insect repellents contain DEET, picaridin, oil of lemon eucalyptus (OLE), or IR3535.  Consider spraying your clothing with a pesticide called permethrin. Permethrin helps prevent insect bites. It works for several weeks and for up to 5-6 clothing washes. Do not apply permethrin directly to the skin.  If your home windows do not have screens, consider installing them.  If you will be sleeping in an area where there are mosquitoes, consider covering your sleeping  area with a mosquito net.  Contact a health care provider if:  Your bite area has signs of infection, such as:  More redness, swelling, or pain.  Fluid or blood.  Warmth.  Pus or a bad smell.  You have a fever.  Get help right away if:  You have a rash.  You have muscle or joint pain.  You feel unusually tired or weak.  You have neck pain or a headache.  You develop symptoms of an anaphylactic reaction. These may include:  Swelling of the eyes, lips, face, mouth, tongue, or throat.  Flushed skin or hives.  Wheezing.  Difficulty breathing, speaking, or swallowing.  Dizziness, light-headedness, or fainting.  Abdominal pain, cramping, vomiting, or diarrhea.  These symptoms may be an emergency. Get help right away. Call 911.  Do not wait to see if the symptoms will go away.  Do not drive yourself to the hospital.  Summary  An insect bite can make your skin red, itchy, and swollen.  Treatment is usually not needed. Symptoms often go away on their own. When treatment is recommended, it may involve taking medicine, applying medicine to the area, or applying ice.  Apply or take over-the-counter and prescription medicines only as told by your health care provider.  Use insect repellent to help prevent insect bites.  Contact a health care provider if your bite area has signs of infection.  This information is not intended to replace advice given to you by your health care provider. Make sure you discuss any questions you have with your health care provider.  Document Revised: 02/20/2022 Document Reviewed: 02/05/2022  Elsevier Patient Education  2024 [employer].

## 2023-12-21 NOTE — Progress Notes (Signed)
Virtual Visit Consent   Robin Kelly, you are scheduled for a virtual visit with a [facility] provider today. Just as with appointments in the office, your consent must be obtained to participate. Your consent will be active for this visit and any virtual visit you may have with one of our providers in the next 365 days. If you have a MyChart account, a copy of this consent can be sent to you electronically.  As this is a virtual visit, video technology does not allow for your provider to perform a traditional examination. This may limit your provider's ability to fully assess your condition. If your provider identifies any concerns that need to be evaluated in person or the need to arrange testing (such as labs, EKG, etc.), we will make arrangements to do so. Although advances in technology are sophisticated, we cannot ensure that it will always work on either your end or our end. If the connection with a video visit is poor, the visit may have to be switched to a telephone visit. With either a video or telephone visit, we are not always able to ensure that we have a secure connection.  By engaging in this virtual visit, you consent to the provision of healthcare and authorize for your insurance to be billed (if applicable) for the services provided during this visit. Depending on your insurance coverage, you may receive a charge related to this service.  I need to obtain your verbal consent now. Are you willing to proceed with your visit today? AAIRA OESTREICHER has provided verbal consent on 12/21/2023 for a virtual visit (video or telephone). Georgana Curio, FNP  Date: 12/21/2023 1:13 PM  Virtual Visit via Video Note   I, Georgana Curio, connected with  Robin Kelly  ([id number], 13-Feb-1966) on 12/21/23 at  1:00 PM EST by a video-enabled telemedicine application and verified that I am speaking with the correct person using two identifiers.  Location: Patient: Virtual Visit Location Patient:  Home Provider: Virtual Visit Location Provider: Home Office   I discussed the limitations of evaluation and management by telemedicine and the availability of in person appointments. The patient expressed understanding and agreed to proceed.    History of Present Illness: Robin Kelly is a 58 y.o. who identifies as a female who was assigned female at birth, and is being seen today for an insect bite to index finger with central opening and increase in surrounding erythema. No fever. No drainage. Marland Kitchen  HPI: HPI  Problems:  Patient Active Problem List   Diagnosis Date Noted   Hypercholesteremia 09/04/2023   Weight loss counseling, encounter for 09/04/2023   Sinusitis 07/03/2023   Vaginitis 05/26/2022   Encounter for completion of form with patient 01/14/2022   Breast cancer screening 08/05/2021   Rash 08/21/2019   Colon cancer screening 08/21/2019   Cervical cancer screening 08/21/2019   Stress 04/05/2019   Leukocytosis 12/13/2018   Rheumatoid arthritis (HCC) 12/13/2018   Elevated blood pressure reading 12/13/2018   Thrombocytosis 10/13/2017   Seronegative arthritis 05/14/2017   Health care maintenance 04/20/2017   Left knee pain 03/09/2017   Pain of right heel 09/15/2016   Anemia 03/14/2016   Status post abdominal hysterectomy 03/12/2016   Status post hysterectomy 03/12/2016   Menorrhagia with irregular cycle 02/06/2016   Endometrial polyp 02/06/2016   Fibroids 02/06/2016    Allergies:  Allergies  Allergen Reactions   Penicillins Swelling   Oxycodone Palpitations   Medications:  Current Outpatient Medications:  cephALEXin (KEFLEX) 500 MG capsule, Take 1 capsule (500 mg total) by mouth 3 (three) times daily for 7 days., Disp: 21 capsule, Rfl: 0   Adalimumab 40 MG/0.8ML PNKT, Inject 40 mg into the skin every 14 (fourteen) days. Humira, Disp: , Rfl:    aspirin 325 MG tablet, Take 325 mg by mouth daily., Disp: , Rfl:    busPIRone (BUSPAR) 5 MG tablet, TAKE 1 TABLET (5 MG  TOTAL) BY MOUTH DAILY., Disp: 90 tablet, Rfl: 1   citalopram (CELEXA) 40 MG tablet, TAKE 1 TABLET BY MOUTH EVERY DAY, Disp: 90 tablet, Rfl: 2   fluticasone (FLONASE) 50 MCG/ACT nasal spray, Place 2 sprays into both nostrils daily., Disp: 16 g, Rfl: 0   folic acid (FOLVITE) 1 MG tablet, Take 1 mg by mouth daily., Disp: , Rfl:    ipratropium (ATROVENT) 0.06 % nasal spray, Place 2 sprays into both nostrils 4 (four) times daily., Disp: 15 mL, Rfl: 12   methotrexate (RHEUMATREX) 2.5 MG tablet, Take by mouth., Disp: , Rfl:    Semaglutide-Weight Management (WEGOVY) 0.25 MG/0.5ML SOAJ, Inject 0.25 mg into the skin once a week., Disp: 2 mL, Rfl: 2  Observations/Objective: Patient is well-developed, well-nourished in no acute distress.  Resting comfortably  at home.  Head is normocephalic, atraumatic.  No labored breathing.  Speech is clear and coherent with logical content.  Patient is alert and oriented at baseline.  Assessment and Plan: 1. Infection of finger (Primary)  2. Insect bite, unspecified site, initial encounter  Keep clean and dry, UC if sx worsen.   Follow Up Instructions: I discussed the assessment and treatment plan with the patient. The patient was provided an opportunity to ask questions and all were answered. The patient agreed with the plan and demonstrated an understanding of the instructions.  A copy of instructions were sent to the patient via MyChart unless otherwise noted below.     The patient was advised to call back or seek an in-person evaluation if the symptoms worsen or if the condition fails to improve as anticipated.    Georgana Curio, FNP

## 2023-12-24 ENCOUNTER — Other Ambulatory Visit: Payer: Self-pay | Admitting: Internal Medicine

## 2023-12-24 NOTE — Telephone Encounter (Signed)
Patient was calling to update on daughters pregnancy and let me know that she is interested in zepbound once her estate money comes in

## 2023-12-31 ENCOUNTER — Telehealth: Payer: Medicaid Other | Admitting: Physician Assistant

## 2023-12-31 DIAGNOSIS — J329 Chronic sinusitis, unspecified: Secondary | ICD-10-CM

## 2023-12-31 MED ORDER — DOXYCYCLINE HYCLATE 100 MG PO TABS: 100.0000 mg | ORAL_TABLET | Freq: Two times a day (BID) | ORAL | 0 refills | Status: DC

## 2023-12-31 MED ORDER — PSEUDOEPHEDRINE HCL ER 120 MG PO TB12: 120.0000 mg | ORAL_TABLET | Freq: Two times a day (BID) | ORAL | 0 refills | Status: DC

## 2023-12-31 NOTE — Patient Instructions (Signed)
 Robin Kelly, thank you for joining [name], PA-C for today's virtual visit.  While this provider is not your primary care provider (PCP), if your PCP is located in our provider database this encounter information will be shared with them immediately following your visit.   A [facility] MyChart account gives you access to today's visit and all your visits, tests, and labs performed at [facility]  click here if you don't have a [facility] MyChart account or go to [email or web address]  Consent: (Patient) Robin Kelly provided verbal consent for this virtual visit at the beginning of the encounter.  Current Medications:  Current Outpatient Medications:    doxycycline  (VIBRA -TABS) 100 MG tablet, Take 1 tablet (100 mg total) by mouth 2 (two) times daily., Disp: 14 tablet, Rfl: 0   pseudoephedrine  (SUDAFED) 120 MG 12 hr tablet, Take 1 tablet (120 mg total) by mouth 2 (two) times daily., Disp: 20 tablet, Rfl: 0   Adalimumab 40 MG/0.8ML PNKT, Inject 40 mg into the skin every 14 (fourteen) days. Humira, Disp: , Rfl:    aspirin 325 MG tablet, Take 325 mg by mouth daily., Disp: , Rfl:    busPIRone  (BUSPAR ) 5 MG tablet, TAKE 1 TABLET (5 MG TOTAL) BY MOUTH DAILY., Disp: 90 tablet, Rfl: 0   citalopram  (CELEXA ) 40 MG tablet, TAKE 1 TABLET BY MOUTH EVERY DAY, Disp: 90 tablet, Rfl: 0   fluticasone  (FLONASE ) 50 MCG/ACT nasal spray, Place 2 sprays into both nostrils daily., Disp: 16 g, Rfl: 0   folic acid (FOLVITE) 1 MG tablet, Take 1 mg by mouth daily., Disp: , Rfl:    ipratropium (ATROVENT ) 0.06 % nasal spray, Place 2 sprays into both nostrils 4 (four) times daily., Disp: 15 mL, Rfl: 12   methotrexate (RHEUMATREX) 2.5 MG tablet, Take by mouth., Disp: , Rfl:    Semaglutide -Weight Management (WEGOVY ) 0.25 MG/0.5ML SOAJ, Inject 0.25 mg into the skin once a week., Disp: 2 mL, Rfl: 2   Medications ordered in this encounter:  Meds ordered this encounter  Medications   doxycycline   (VIBRA -TABS) 100 MG tablet    Sig: Take 1 tablet (100 mg total) by mouth 2 (two) times daily.    Dispense:  14 tablet    Refill:  0    Supervising Provider:   BLAISE ALEENE KIDD [[id number]]   pseudoephedrine  (SUDAFED) 120 MG 12 hr tablet    Sig: Take 1 tablet (120 mg total) by mouth 2 (two) times daily.    Dispense:  20 tablet    Refill:  0    Supervising Provider:   LAMPTEY, PHILIP O [[id number]]     *If you need refills on other medications prior to your next appointment, please contact your pharmacy*  Follow-Up: Call back or seek an in-person evaluation if the symptoms worsen or if the condition fails to improve as anticipated.  [facility] [phone]  Other Instructions Increase fluids. Start with oral decongestant as directed on the box. If no improvement in 1-3 days then start oral antibiotic as prescribed. Continue to watch for worsening symptoms.   If you have been instructed to have an in-person evaluation today at a local Urgent Care facility, please use the link below. It will take you to a list of all of our available [facility], including address, phone number and hours of operation. Please do not delay care.  [facility]  If you or a family member do not have a primary care  provider, use the link below to schedule a visit and establish care. When you choose a [facility] primary care physician or advanced practice provider, you gain a long-term partner in health. Find a Primary Care Provider  Learn more about [facility]'s in-office and virtual care options: [facility] - Get Care Now

## 2023-12-31 NOTE — Progress Notes (Signed)
 Virtual Visit Consent   Robin Kelly, you are scheduled for a virtual visit with a [facility] provider today. Just as with appointments in the office, your consent must be obtained to participate. Your consent will be active for this visit and any virtual visit you may have with one of our providers in the next 365 days. If you have a MyChart account, a copy of this consent can be sent to you electronically.  As this is a virtual visit, video technology does not allow for your provider to perform a traditional examination. This may limit your provider's ability to fully assess your condition. If your provider identifies any concerns that need to be evaluated in person or the need to arrange testing (such as labs, EKG, etc.), we will make arrangements to do so. Although advances in technology are sophisticated, we cannot ensure that it will always work on either your end or our end. If the connection with a video visit is poor, the visit may have to be switched to a telephone visit. With either a video or telephone visit, we are not always able to ensure that we have a secure connection.  By engaging in this virtual visit, you consent to the provision of healthcare and authorize for your insurance to be billed (if applicable) for the services provided during this visit. Depending on your insurance coverage, you may receive a charge related to this service.  I need to obtain your verbal consent now. Are you willing to proceed with your visit today? Robin Kelly has provided verbal consent on 12/31/2023 for a virtual visit (video or telephone). Robin Kelly, [place]  Date: 12/31/2023 5:35 PM  Virtual Visit via Video Note   I, [name], connected with  Robin Kelly  ([id number], [birth year]) on 12/31/23 at  5:30 PM EST by a video-enabled telemedicine application and verified that I am speaking with the correct person using two identifiers.  Location: Patient: Virtual Visit Location Patient:  Home Provider: Virtual Visit Location Provider: Home Office   I discussed the limitations of evaluation and management by telemedicine and the availability of in person appointments. The patient expressed understanding and agreed to proceed.    History of Present Illness: Robin Kelly is a 58 y.o. who identifies as a female who was assigned female at birth, and is being seen today for sinus infection.  HPI: 58 y/o F with h/o RA presents via telehealth for c/o sinus pressure, pain, nasal congestion ,thick green phlegm, cough x 7-10 days and getting worse. Initially with flu like symptoms. Has been taking otc cold and flu medicines along with cough suppressant.   Sinusitis    Problems:  Patient Active Problem List   Diagnosis Date Noted   Hypercholesteremia 09/04/2023   Weight loss counseling, encounter for 09/04/2023   Sinusitis 07/03/2023   Vaginitis 05/26/2022   Encounter for completion of form with patient 01/14/2022   Breast cancer screening 08/05/2021   Rash 08/21/2019   Colon cancer screening 08/21/2019   Cervical cancer screening 08/21/2019   Stress 04/05/2019   Leukocytosis 12/13/2018   Rheumatoid arthritis (HCC) 12/13/2018   Elevated blood pressure reading 12/13/2018   Thrombocytosis 10/13/2017   Seronegative arthritis 05/14/2017   Health care maintenance 04/20/2017   Left knee pain 03/09/2017   Pain of right heel 09/15/2016   Anemia 03/14/2016   Status post abdominal hysterectomy 03/12/2016   Status post hysterectomy 03/12/2016   Menorrhagia with irregular cycle 02/06/2016   Endometrial polyp 02/06/2016  Fibroids 02/06/2016    Allergies:  Allergies  Allergen Reactions   Penicillins Swelling   Oxycodone Palpitations   Medications:  Current Outpatient Medications:    doxycycline  (VIBRA -TABS) 100 MG tablet, Take 1 tablet (100 mg total) by mouth 2 (two) times daily., Disp: 14 tablet, Rfl: 0   pseudoephedrine  (SUDAFED) 120 MG 12 hr tablet, Take 1 tablet (120 mg  total) by mouth 2 (two) times daily., Disp: 20 tablet, Rfl: 0   Adalimumab 40 MG/0.8ML PNKT, Inject 40 mg into the skin every 14 (fourteen) days. Humira, Disp: , Rfl:    aspirin 325 MG tablet, Take 325 mg by mouth daily., Disp: , Rfl:    busPIRone  (BUSPAR ) 5 MG tablet, TAKE 1 TABLET (5 MG TOTAL) BY MOUTH DAILY., Disp: 90 tablet, Rfl: 0   citalopram  (CELEXA ) 40 MG tablet, TAKE 1 TABLET BY MOUTH EVERY DAY, Disp: 90 tablet, Rfl: 0   fluticasone  (FLONASE ) 50 MCG/ACT nasal spray, Place 2 sprays into both nostrils daily., Disp: 16 g, Rfl: 0   folic acid (FOLVITE) 1 MG tablet, Take 1 mg by mouth daily., Disp: , Rfl:    ipratropium (ATROVENT ) 0.06 % nasal spray, Place 2 sprays into both nostrils 4 (four) times daily., Disp: 15 mL, Rfl: 12   methotrexate (RHEUMATREX) 2.5 MG tablet, Take by mouth., Disp: , Rfl:    Semaglutide -Weight Management (WEGOVY ) 0.25 MG/0.5ML SOAJ, Inject 0.25 mg into the skin once a week., Disp: 2 mL, Rfl: 2  Observations/Objective: Patient is well-developed, well-nourished in no acute distress.  Resting comfortably  at home.  Head is normocephalic, atraumatic.  No labored breathing.  Speech is clear and coherent with logical content.  Patient is alert and oriented at baseline.    Assessment and Plan: 1. Rhinosinusitis (Primary) - doxycycline  (VIBRA -TABS) 100 MG tablet; Take 1 tablet (100 mg total) by mouth 2 (two) times daily.  Dispense: 14 tablet; Refill: 0 - pseudoephedrine  (SUDAFED) 120 MG 12 hr tablet; Take 1 tablet (120 mg total) by mouth 2 (two) times daily.  Dispense: 20 tablet; Refill: 0  Increase fluids. Start with oral decongestant as directed on the box. If no improvement in 1-3 days then start oral antibiotic as prescribed. Continue to watch for worsening symptoms. Pt verbalized understanding and in agreement.    Follow Up Instructions: I discussed the assessment and treatment plan with the patient. The patient was provided an opportunity to ask questions  and all were answered. The patient agreed with the plan and demonstrated an understanding of the instructions.  A copy of instructions were sent to the patient via MyChart unless otherwise noted below.   Patient has requested to receive PHI (AVS, Work Notes, etc) pertaining to this video visit through e-mail as they are currently without active MyChart. They have voiced understand that email is not considered secure and their health information could be viewed by someone other than the patient.   The patient was advised to call back or seek an in-person evaluation if the symptoms worsen or if the condition fails to improve as anticipated.    Tongela Encinas, PA-C

## 2024-01-29 ENCOUNTER — Ambulatory Visit: Payer: 59 | Admitting: Dermatology

## 2024-02-05 DIAGNOSIS — M0609 Rheumatoid arthritis without rheumatoid factor, multiple sites: Secondary | ICD-10-CM | POA: Diagnosis not present

## 2024-02-05 DIAGNOSIS — Z79899 Other long term (current) drug therapy: Secondary | ICD-10-CM | POA: Diagnosis not present

## 2024-02-05 DIAGNOSIS — M150 Primary generalized (osteo)arthritis: Secondary | ICD-10-CM | POA: Diagnosis not present

## 2024-02-05 DIAGNOSIS — J0100 Acute maxillary sinusitis, unspecified: Secondary | ICD-10-CM | POA: Diagnosis not present

## 2024-03-11 ENCOUNTER — Other Ambulatory Visit: Payer: Medicaid Other

## 2024-03-16 ENCOUNTER — Encounter: Payer: Medicaid Other | Admitting: Internal Medicine

## 2024-03-26 ENCOUNTER — Other Ambulatory Visit: Payer: Self-pay | Admitting: Internal Medicine

## 2024-03-29 ENCOUNTER — Other Ambulatory Visit: Payer: Self-pay | Admitting: Internal Medicine

## 2024-03-30 ENCOUNTER — Ambulatory Visit: Payer: 59 | Admitting: Dermatology

## 2024-05-06 ENCOUNTER — Telehealth: Admitting: Nurse Practitioner

## 2024-05-06 DIAGNOSIS — J329 Chronic sinusitis, unspecified: Secondary | ICD-10-CM

## 2024-05-06 MED ORDER — DOXYCYCLINE HYCLATE 100 MG PO TABS
100.0000 mg | ORAL_TABLET | Freq: Two times a day (BID) | ORAL | 0 refills | Status: DC
Start: 2024-05-06 — End: 2024-10-18

## 2024-05-06 NOTE — Progress Notes (Signed)
 Virtual Visit Consent   Robin Kelly, you are scheduled for a virtual visit with a [facility] provider today. Just as with appointments in the office, your consent must be obtained to participate. Your consent will be active for this visit and any virtual visit you may have with one of our providers in the next 365 days. If you have a MyChart account, a copy of this consent can be sent to you electronically.  As this is a virtual visit, video technology does not allow for your provider to perform a traditional examination. This may limit your provider's ability to fully assess your condition. If your provider identifies any concerns that need to be evaluated in person or the need to arrange testing (such as labs, EKG, etc.), we will make arrangements to do so. Although advances in technology are sophisticated, we cannot ensure that it will always work on either your end or our end. If the connection with a video visit is poor, the visit may have to be switched to a telephone visit. With either a video or telephone visit, we are not always able to ensure that we have a secure connection.  By engaging in this virtual visit, you consent to the provision of healthcare and authorize for your insurance to be billed (if applicable) for the services provided during this visit. Depending on your insurance coverage, you may receive a charge related to this service.  I need to obtain your verbal consent now. Are you willing to proceed with your visit today? [name] has provided verbal consent on 05/06/2024 for a virtual visit (video or telephone). [name], NP  Date: 05/06/2024 5:49 PM   Virtual Visit via Video Note   I, [name], connected with  Robin Kelly  ([id number], 16-Jan-1966) on 05/06/24 at  5:30 PM EDT by a video-enabled telemedicine application and verified that I am speaking with the correct person using two identifiers.  Location: Patient: Virtual Visit Location Patient:  Home Provider: Virtual Visit Location Provider: Home Office   I discussed the limitations of evaluation and management by telemedicine and the availability of in person appointments. The patient expressed understanding and agreed to proceed.    History of Present Illness: Robin Kelly is a 58 y.o. who identifies as a female who was assigned female at birth, and is being seen today for sinusitis.  Ms Garin states she has a history of bacterial sinusitis. She has been taking zyrtec for the past 6 weeks for allergy symptoms. Over the past several days her symptoms have worsened and currently include: frontal headache, sinus pressure, coughing, sore throat   Problems:  Patient Active Problem List   Diagnosis Date Noted   Hypercholesteremia 09/04/2023   Weight loss counseling, encounter for 09/04/2023   Sinusitis 07/03/2023   Vaginitis 05/26/2022   Encounter for completion of form with patient 01/14/2022   Breast cancer screening 08/05/2021   Rash 08/21/2019   Colon cancer screening 08/21/2019   Cervical cancer screening 08/21/2019   Stress 04/05/2019   Leukocytosis 12/13/2018   Rheumatoid arthritis (HCC) 12/13/2018   Elevated blood pressure reading 12/13/2018   Thrombocytosis 10/13/2017   Seronegative arthritis 05/14/2017   Health care maintenance 04/20/2017   Left knee pain 03/09/2017   Pain of right heel 09/15/2016   Anemia 03/14/2016   Status post abdominal hysterectomy 03/12/2016   Status post hysterectomy 03/12/2016   Menorrhagia with irregular cycle 02/06/2016   Endometrial polyp 02/06/2016   Fibroids 02/06/2016  Allergies:  Allergies  Allergen Reactions   Penicillins Swelling   Oxycodone Palpitations   Medications:  Current Outpatient Medications:    Adalimumab 40 MG/0.8ML PNKT, Inject 40 mg into the skin every 14 (fourteen) days. Humira, Disp: , Rfl:    aspirin 325 MG tablet, Take 325 mg by mouth daily., Disp: , Rfl:    busPIRone  (BUSPAR ) 5 MG tablet, TAKE 1  TABLET (5 MG TOTAL) BY MOUTH DAILY., Disp: 90 tablet, Rfl: 0   citalopram  (CELEXA ) 40 MG tablet, TAKE 1 TABLET BY MOUTH EVERY DAY, Disp: 90 tablet, Rfl: 1   doxycycline  (VIBRA -TABS) 100 MG tablet, Take 1 tablet (100 mg total) by mouth 2 (two) times daily., Disp: 14 tablet, Rfl: 0   fluticasone  (FLONASE ) 50 MCG/ACT nasal spray, Place 2 sprays into both nostrils daily., Disp: 16 g, Rfl: 0   folic acid (FOLVITE) 1 MG tablet, Take 1 mg by mouth daily., Disp: , Rfl:    ipratropium (ATROVENT ) 0.06 % nasal spray, Place 2 sprays into both nostrils 4 (four) times daily., Disp: 15 mL, Rfl: 12   methotrexate (RHEUMATREX) 2.5 MG tablet, Take by mouth., Disp: , Rfl:    pseudoephedrine  (SUDAFED) 120 MG 12 hr tablet, Take 1 tablet (120 mg total) by mouth 2 (two) times daily., Disp: 20 tablet, Rfl: 0   Semaglutide -Weight Management (WEGOVY ) 0.25 MG/0.5ML SOAJ, Inject 0.25 mg into the skin once a week., Disp: 2 mL, Rfl: 2  Observations/Objective: Patient is well-developed, well-nourished in no acute distress.  Resting comfortably  at home.  Head is normocephalic, atraumatic.  No labored breathing.  Speech is clear and coherent with logical content.  Patient is alert and oriented at baseline.    Assessment and Plan: 1. Rhinosinusitis - doxycycline  (VIBRA -TABS) 100 MG tablet; Take 1 tablet (100 mg total) by mouth 2 (two) times daily.  Dispense: 14 tablet; Refill: 0 INSTRUCTIONS: use a humidifier for nasal congestion Drink plenty of fluids, rest and wash hands frequently to avoid the spread of infection Alternate tylenol  and Motrin  for relief of fever   Follow Up Instructions: I discussed the assessment and treatment plan with the patient. The patient was provided an opportunity to ask questions and all were answered. The patient agreed with the plan and demonstrated an understanding of the instructions.  A copy of instructions were sent to the patient via MyChart unless otherwise noted below.    The  patient was advised to call back or seek an in-person evaluation if the symptoms worsen or if the condition fails to improve as anticipated.    Ketsia Linebaugh W Ciera Beckum, NP

## 2024-05-06 NOTE — Patient Instructions (Signed)
 Robin Kelly, thank you for joining [name], NP for today's virtual visit.  While this provider is not your primary care provider (PCP), if your PCP is located in our provider database this encounter information will be shared with them immediately following your visit.   A [facility] MyChart account gives you access to today's visit and all your visits, tests, and labs performed at [facility]  click here if you don't have a [facility] MyChart account or go to [email or web address]  Consent: (Patient) Robin Kelly provided verbal consent for this virtual visit at the beginning of the encounter.  Current Medications:  Current Outpatient Medications:    Adalimumab 40 MG/0.8ML PNKT, Inject 40 mg into the skin every 14 (fourteen) days. Humira, Disp: , Rfl:    aspirin 325 MG tablet, Take 325 mg by mouth daily., Disp: , Rfl:    busPIRone  (BUSPAR ) 5 MG tablet, TAKE 1 TABLET (5 MG TOTAL) BY MOUTH DAILY., Disp: 90 tablet, Rfl: 0   citalopram  (CELEXA ) 40 MG tablet, TAKE 1 TABLET BY MOUTH EVERY DAY, Disp: 90 tablet, Rfl: 1   doxycycline  (VIBRA -TABS) 100 MG tablet, Take 1 tablet (100 mg total) by mouth 2 (two) times daily., Disp: 14 tablet, Rfl: 0   fluticasone  (FLONASE ) 50 MCG/ACT nasal spray, Place 2 sprays into both nostrils daily., Disp: 16 g, Rfl: 0   folic acid (FOLVITE) 1 MG tablet, Take 1 mg by mouth daily., Disp: , Rfl:    ipratropium (ATROVENT ) 0.06 % nasal spray, Place 2 sprays into both nostrils 4 (four) times daily., Disp: 15 mL, Rfl: 12   methotrexate (RHEUMATREX) 2.5 MG tablet, Take by mouth., Disp: , Rfl:    pseudoephedrine  (SUDAFED) 120 MG 12 hr tablet, Take 1 tablet (120 mg total) by mouth 2 (two) times daily., Disp: 20 tablet, Rfl: 0   Semaglutide -Weight Management (WEGOVY ) 0.25 MG/0.5ML SOAJ, Inject 0.25 mg into the skin once a week., Disp: 2 mL, Rfl: 2   Medications ordered in this encounter:  Meds ordered this encounter  Medications   doxycycline   (VIBRA -TABS) 100 MG tablet    Sig: Take 1 tablet (100 mg total) by mouth 2 (two) times daily.    Dispense:  14 tablet    Refill:  0    Supervising Provider:   Corine Dice [[id number]]     *If you need refills on other medications prior to your next appointment, please contact your pharmacy*  Follow-Up: Call back or seek an in-person evaluation if the symptoms worsen or if the condition fails to improve as anticipated.  [facility] [phone]  Other Instructions INSTRUCTIONS: use a humidifier for nasal congestion Drink plenty of fluids, rest and wash hands frequently to avoid the spread of infection Alternate tylenol  and Motrin  for relief of fever    If you have been instructed to have an in-person evaluation today at a local Urgent Care facility, please use the link below. It will take you to a list of all of our available [facility], including address, phone number and hours of operation. Please do not delay care.  [facility]  If you or a family member do not have a primary care provider, use the link below to schedule a visit and establish care. When you choose a [facility] primary care physician or advanced practice provider, you gain a long-term partner in health. Find a Primary Care Provider  Learn more about [facility]'s in-office and virtual  care options: [facility] - Get Care Now

## 2024-05-20 ENCOUNTER — Other Ambulatory Visit: Payer: Self-pay | Admitting: Internal Medicine

## 2024-05-27 ENCOUNTER — Encounter: Payer: Self-pay | Admitting: Internal Medicine

## 2024-05-27 ENCOUNTER — Telehealth: Payer: Self-pay | Admitting: Internal Medicine

## 2024-05-27 NOTE — Telephone Encounter (Signed)
 Left message rescheduling office visit from 7/11 to 7/9 @ 1:30

## 2024-05-31 ENCOUNTER — Other Ambulatory Visit

## 2024-06-01 ENCOUNTER — Other Ambulatory Visit

## 2024-06-02 ENCOUNTER — Encounter: Admitting: Internal Medicine

## 2024-06-04 ENCOUNTER — Encounter: Admitting: Internal Medicine

## 2024-06-22 ENCOUNTER — Other Ambulatory Visit (INDEPENDENT_AMBULATORY_CARE_PROVIDER_SITE_OTHER)

## 2024-06-22 DIAGNOSIS — E7800 Pure hypercholesterolemia, unspecified: Secondary | ICD-10-CM | POA: Diagnosis not present

## 2024-06-22 DIAGNOSIS — D72829 Elevated white blood cell count, unspecified: Secondary | ICD-10-CM | POA: Diagnosis not present

## 2024-06-22 LAB — CBC WITH DIFFERENTIAL/PLATELET
Basophils Absolute: 0 K/uL (ref 0.0–0.1)
Basophils Relative: 0.6 % (ref 0.0–3.0)
Eosinophils Absolute: 0.2 K/uL (ref 0.0–0.7)
Eosinophils Relative: 3.1 % (ref 0.0–5.0)
HCT: 39.6 % (ref 36.0–46.0)
Hemoglobin: 13.1 g/dL (ref 12.0–15.0)
Lymphocytes Relative: 42.3 % (ref 12.0–46.0)
Lymphs Abs: 2.6 K/uL (ref 0.7–4.0)
MCHC: 33 g/dL (ref 30.0–36.0)
MCV: 95.7 fl (ref 78.0–100.0)
Monocytes Absolute: 0.6 K/uL (ref 0.1–1.0)
Monocytes Relative: 9.4 % (ref 3.0–12.0)
Neutro Abs: 2.8 K/uL (ref 1.4–7.7)
Neutrophils Relative %: 44.6 % (ref 43.0–77.0)
Platelets: 393 K/uL (ref 150.0–400.0)
RBC: 4.14 Mil/uL (ref 3.87–5.11)
RDW: 15 % (ref 11.5–15.5)
WBC: 6.2 K/uL (ref 4.0–10.5)

## 2024-06-22 LAB — HEPATIC FUNCTION PANEL
ALT: 18 U/L (ref 0–35)
AST: 18 U/L (ref 0–37)
Albumin: 4.1 g/dL (ref 3.5–5.2)
Alkaline Phosphatase: 91 U/L (ref 39–117)
Bilirubin, Direct: 0.1 mg/dL (ref 0.0–0.3)
Total Bilirubin: 0.5 mg/dL (ref 0.2–1.2)
Total Protein: 7 g/dL (ref 6.0–8.3)

## 2024-06-22 LAB — BASIC METABOLIC PANEL WITH GFR
BUN: 13 mg/dL (ref 6–23)
CO2: 25 meq/L (ref 19–32)
Calcium: 9.1 mg/dL (ref 8.4–10.5)
Chloride: 105 meq/L (ref 96–112)
Creatinine, Ser: 0.66 mg/dL (ref 0.40–1.20)
GFR: 96.45 mL/min (ref >60.00)
Glucose, Bld: 99 mg/dL (ref 70–99)
Potassium: 4.1 meq/L (ref 3.5–5.1)
Sodium: 139 meq/L (ref 135–145)

## 2024-06-22 LAB — LIPID PANEL
Cholesterol: 168 mg/dL (ref 0–200)
HDL: 48.7 mg/dL (ref >39.00)
LDL Cholesterol: 94 mg/dL (ref 0–99)
NonHDL: 119.68
Total CHOL/HDL Ratio: 3
Triglycerides: 130 mg/dL (ref 0.0–149.0)
VLDL: 26 mg/dL (ref 0.0–40.0)

## 2024-06-23 ENCOUNTER — Ambulatory Visit: Payer: Self-pay | Admitting: Internal Medicine

## 2024-06-28 ENCOUNTER — Ambulatory Visit: Admitting: Dermatology

## 2024-07-12 DIAGNOSIS — M150 Primary generalized (osteo)arthritis: Secondary | ICD-10-CM | POA: Diagnosis not present

## 2024-07-12 DIAGNOSIS — M0609 Rheumatoid arthritis without rheumatoid factor, multiple sites: Secondary | ICD-10-CM | POA: Diagnosis not present

## 2024-07-12 DIAGNOSIS — Z111 Encounter for screening for respiratory tuberculosis: Secondary | ICD-10-CM | POA: Diagnosis not present

## 2024-07-12 DIAGNOSIS — Z79899 Other long term (current) drug therapy: Secondary | ICD-10-CM | POA: Diagnosis not present

## 2024-08-10 ENCOUNTER — Telehealth: Payer: Self-pay | Admitting: Internal Medicine

## 2024-08-10 NOTE — Telephone Encounter (Signed)
 Lm for patient to call and reschedule 08/24/2024 appointment with Dr Glendia. MyChart message sent.  E2C2 please reschedule to next available slot with provider.

## 2024-08-11 ENCOUNTER — Telehealth: Payer: Self-pay

## 2024-08-11 ENCOUNTER — Ambulatory Visit: Admitting: Dermatology

## 2024-08-11 NOTE — Telephone Encounter (Signed)
 Copied from CRM [id number]. Topic: Clinical - Medication Question >> Aug 11, 2024 11:45 AM Robin Kelly wrote: Reason for CRM: Patient has a message for [name] to call her. She had to reschedule her physical to November due to provider being out of office. Patient would like to know if Dr. Glendia can call in one of these medications to have the refills set before her physcial in November. She said [name] would know which ones would need to be called in. She just listed out the three.  folic acid (FOLVITE) 1 MG tablet , busPIRone  (BUSPAR ) 5 MG tablet, citalopram  (CELEXA ) 40 MG tablet.   [phone] (M)

## 2024-08-24 ENCOUNTER — Encounter: Admitting: Internal Medicine

## 2024-08-27 ENCOUNTER — Ambulatory Visit
Admission: RE | Admit: 2024-08-27 | Discharge: 2024-08-27 | Disposition: A | Discharge status: Home / Self Care | Source: Ambulatory Visit | Attending: Emergency Medicine | Admitting: Emergency Medicine

## 2024-08-27 VITALS — BP 131/80 | HR 94 | Temp 98.3°F | Resp 18

## 2024-08-27 DIAGNOSIS — H60391 Other infective otitis externa, right ear: Secondary | ICD-10-CM

## 2024-08-27 MED ORDER — NEOMYCIN-POLYMYXIN-HC 3.5-10000-1 OT SUSP: 4.0000 [drp] | Freq: Three times a day (TID) | OTIC | 0 refills | Status: DC

## 2024-08-27 NOTE — Discharge Instructions (Addendum)
 Instill 4 drops of Cortisporin otic into your right ear 3 times a day for the next 7 days for treatment of your external ear infection.  The ear wick will fall out on its own once the swelling decreases.  Even though your swelling improves please finish the entire course of antibiotic therapy.  Take an over-the-counter probiotic 1 hour after each dose of antibiotic to prevent diarrhea.  Use over-the-counter Tylenol  and ibuprofen  as needed for pain or fever.  Place a hot water bottle, or heating pad, underneath your pillowcase at night to help dilate up your ear and aid in pain relief as well as resolution of the infection.  Return for reevaluation for any new or worsening symptoms.

## 2024-08-27 NOTE — ED Provider Notes (Signed)
 [facility]    CSN: [id number] Arrival date & time: 08/27/24  1500      History   Chief Complaint Chief Complaint  Patient presents with   Ear Fullness    Ear clogged up. Fluid in ear. Very sore. Swollen on outside. - Entered by patient    HPI Robin Kelly is a 58 y.o. female.   HPI  58 year old female with past medical history significant for rheumatoid arthritis, anemia, high cholesterol presents for evaluation of 2 weeks worth of right sided ear pain, soreness, swelling, and bloody drainage.  She reports that she noticed a bump in her ear canal and she applied hydrogen peroxide to the bump for 2 to 3 days and then the ear began draining a clear fluid.  She has had decreased hearing in the ear but no fever or URI symptoms.  Past Medical History:  Diagnosis Date   Allergy    Anemia    Anxiety    due to heavy bleeding and SOB   Arthritis    Cancer (HCC) 2004   melanoma   Heavy menstrual bleeding    Melanoma (HCC) 2004   abdomen, txted by Dr. Ubaldo Cleveland   Rheumatoid arthritis [facility])    Seasonal allergies    Shortness of breath dyspnea    due to anemia    Patient Active Problem List   Diagnosis Date Noted   Hypercholesteremia 09/04/2023   Weight loss counseling, encounter for 09/04/2023   Sinusitis 07/03/2023   Vaginitis 05/26/2022   Encounter for completion of form with patient 01/14/2022   Breast cancer screening 08/05/2021   Rash 08/21/2019   Colon cancer screening 08/21/2019   Cervical cancer screening 08/21/2019   Stress 04/05/2019   Leukocytosis 12/13/2018   Rheumatoid arthritis (HCC) 12/13/2018   Elevated blood pressure reading 12/13/2018   Thrombocytosis 10/13/2017   Seronegative arthritis 05/14/2017   Health care maintenance 04/20/2017   Left knee pain 03/09/2017   Pain of right heel 09/15/2016   Anemia 03/14/2016   Status post abdominal hysterectomy 03/12/2016   Status post hysterectomy 03/12/2016   Menorrhagia with irregular  cycle 02/06/2016   Endometrial polyp 02/06/2016   Fibroids 02/06/2016    Past Surgical History:  Procedure Laterality Date   ABDOMINAL HYSTERECTOMY     APPENDECTOMY  2001   right tube and ovary removed also.   BREAST BIOPSY Right 07/28/2019   us  bx, - BENIGN MAMMARY PARENCHYMA WITH FIBROCYSTIC    CESAREAN SECTION     x2   CHOLECYSTECTOMY     CYSTOSCOPY N/A 03/12/2016   Procedure: CYSTOSCOPY;  Surgeon: Garnette JONETTA Mace, MD;  Location: [facility];  Service: Gynecology;  Laterality: N/A;   DILATATION & CURETTAGE/HYSTEROSCOPY WITH MYOSURE N/A 02/06/2016   Procedure: DILATATION & CURETTAGE HYSTEROSCOPY  polypectomy;  Surgeon: Garnette JONETTA Mace, MD;  Location: [facility] OR[facility];  Service: Gynecology;  Laterality: N/A;   POLYPECTOMY N/A 02/06/2016   Procedure: POLYPECTOMY;  Surgeon: Garnette JONETTA Mace, MD;  Location: [facility] OR[facility];  Service: Gynecology;  Laterality: N/A;   rheumatoid arthritis     ROBOTIC ASSISTED TOTAL HYSTERECTOMY WITH SALPINGECTOMY Bilateral 03/12/2016   Converted to total abdominal hysterectom   SALPINGOOPHORECTOMY Right    during appendectomy.    OB History   No obstetric history on file.      Home Medications    Prior to Admission medications   Medication Sig Start Date End Date Taking? Authorizing Provider  neomycin-polymyxin-hydrocortisone (CORTISPORIN) 3.5-10000-1 OTIC suspension Place 4 drops into the  right ear 3 (three) times daily. 08/27/24  Yes Bernardino Ditch, NP  Adalimumab 40 MG/0.8ML PNKT Inject 40 mg into the skin every 14 (fourteen) days. Humira    [provider]  aspirin 325 MG tablet Take 325 mg by mouth daily.    [provider]  busPIRone  (BUSPAR ) 5 MG tablet TAKE 1 TABLET (5 MG TOTAL) BY MOUTH DAILY. 05/20/24   Glendia Shad, MD  citalopram  (CELEXA ) 40 MG tablet TAKE 1 TABLET BY MOUTH EVERY DAY 03/26/24   Glendia Shad, MD  doxycycline  (VIBRA -TABS) 100 MG tablet Take 1 tablet (100 mg total) by mouth 2 (two) times daily. Patient not taking:  Reported on 08/27/2024 05/06/24   Fleming, Zelda W, NP  fluticasone  (FLONASE ) 50 MCG/ACT nasal spray Place 2 sprays into both nostrils daily. 05/20/23   Gladis Elsie BROCKS, PA-C  folic acid (FOLVITE) 1 MG tablet Take 1 mg by mouth daily.    [provider]  ipratropium (ATROVENT ) 0.06 % nasal spray Place 2 sprays into both nostrils 4 (four) times daily. 03/02/22   Bernardino Ditch, NP  methotrexate (RHEUMATREX) 2.5 MG tablet Take by mouth. 02/04/23   [provider]  pseudoephedrine  (SUDAFED) 120 MG 12 hr tablet Take 1 tablet (120 mg total) by mouth 2 (two) times daily. 12/31/23   Gandhi, Safal, PA-C  Semaglutide -Weight Management (WEGOVY ) 0.25 MG/0.5ML SOAJ Inject 0.25 mg into the skin once a week. 09/04/23   Glendia Shad, MD    Family History Family History  Problem Relation Age of Onset   Hypertension Mother    Hypertension Father    Heart failure Father    Cancer Father    Diabetes Paternal Grandmother    Breast cancer Neg Hx     Social History Social History   Tobacco Use   Smoking status: Never   Smokeless tobacco: Never  Vaping Use   Vaping status: Never Used  Substance Use Topics   Alcohol use: No   Drug use: No     Allergies   Penicillins and Oxycodone   Review of Systems Review of Systems  Constitutional:  Negative for fever.  HENT:  Positive for ear discharge, ear pain and hearing loss. Negative for congestion and rhinorrhea.      Physical Exam Triage Vital Signs ED Triage Vitals  Encounter Vitals Group     BP 08/27/24 1529 131/80     Girls Systolic BP Percentile --      Girls Diastolic BP Percentile --      Boys Systolic BP Percentile --      Boys Diastolic BP Percentile --      Pulse Rate 08/27/24 1529 94     Resp 08/27/24 1529 18     Temp 08/27/24 1529 98.3 F (36.8 C)     Temp src --      SpO2 08/27/24 1529 100 %     Weight --      Height --      Head Circumference --      Peak Flow --      Pain Score 08/27/24 1527 5     Pain Loc  --      Pain Education --      Exclude from Growth Chart --    No data found.  Updated Vital Signs BP 131/80   Pulse 94   Temp 98.3 F (36.8 C)   Resp 18   LMP 10/23/2015 Comment: bleeding since 10/23/15  SpO2 100%   Visual Acuity Right Eye  Distance:   Left Eye Distance:   Bilateral Distance:    Right Eye Near:   Left Eye Near:    Bilateral Near:     Physical Exam Vitals and nursing note reviewed.  Constitutional:      Appearance: Normal appearance. She is not ill-appearing.  HENT:     Head: Normocephalic and atraumatic.     Right Ear: External ear normal. There is no impacted cerumen.     Left Ear: Tympanic membrane, ear canal and external ear normal. There is no impacted cerumen.     Ears:     Comments: Marked edema to the right external auditory canal. Neurological:     Mental Status: She is alert.      UC Treatments / Results  Labs (all labs ordered are listed, but only abnormal results are displayed) Labs Reviewed - No data to display  EKG   Radiology No results found.  Procedures Procedures (including critical care time)  Medications Ordered in UC Medications - No data to display  Initial Impression / Assessment and Plan / UC Course  I have reviewed the triage vital signs and the nursing notes.  Pertinent labs & imaging results that were available during my care of the patient were reviewed by me and considered in my medical decision making (see chart for details).   Patient is a pleasant, nontoxic-appearing 58 year old female pending for evaluation of pain, swelling, drainage from her right ear as outlined in HPI above.  On exam I am unable to inspect the external auditory canal due to marked edema.  The left EAC is patent and the left TM is pearly gray in appearance.  She denies any URI symptoms.  She reports that she only got bloody drainage at 1 time and that was with a Q-tip when she thought she had a sore in her ear and she was applying  hydrogen peroxide to it.  She denies any recent swimming or water exposure.  Her water at home is on a well and she has not had it tested recently.  Due to the marked edema that is in place I did instill a Pope ear wick coated in bacitracin ointment I will discharge her home on Cortisporin otic, 4 drops 3 times daily in the right ear for 5 days.   Final Clinical Impressions(s) / UC Diagnoses   Final diagnoses:  Infective otitis externa of right ear     Discharge Instructions      Instill 4 drops of Cortisporin otic into your right ear 3 times a day for the next 7 days for treatment of your external ear infection.  The ear wick will fall out on its own once the swelling decreases.  Even though your swelling improves please finish the entire course of antibiotic therapy.  Take an over-the-counter probiotic 1 hour after each dose of antibiotic to prevent diarrhea.  Use over-the-counter Tylenol  and ibuprofen  as needed for pain or fever.  Place a hot water bottle, or heating pad, underneath your pillowcase at night to help dilate up your ear and aid in pain relief as well as resolution of the infection.  Return for reevaluation for any new or worsening symptoms.      ED Prescriptions     Medication Sig Dispense Auth. Provider   neomycin-polymyxin-hydrocortisone (CORTISPORIN) 3.5-10000-1 OTIC suspension Place 4 drops into the right ear 3 (three) times daily. 10 mL Bernardino Ditch, NP      PDMP not reviewed this encounter.   Bernardino,  Venetia, NP 08/27/24 1544

## 2024-08-27 NOTE — ED Triage Notes (Signed)
 Patient to Urgent Care with complaints of right sided ear pain/ soreness/ swelling/ bloody drainage.  Symptoms x2 weeks. Started with a bump in her ear canal which then started draining. Attempted hydrogen peroxide on a q-tip for 3 days. Drainage x1 week.

## 2024-09-26 ENCOUNTER — Other Ambulatory Visit: Payer: Self-pay | Admitting: Internal Medicine

## 2024-10-12 NOTE — Telephone Encounter (Signed)
 open in error

## 2024-10-18 ENCOUNTER — Encounter: Payer: Self-pay | Admitting: Internal Medicine

## 2024-10-18 ENCOUNTER — Ambulatory Visit (INDEPENDENT_AMBULATORY_CARE_PROVIDER_SITE_OTHER): Admitting: Internal Medicine

## 2024-10-18 ENCOUNTER — Telehealth: Payer: Self-pay

## 2024-10-18 VITALS — BP 120/80 | HR 78 | Temp 98.8°F | Ht 62.0 in | Wt 194.0 lb

## 2024-10-18 DIAGNOSIS — M069 Rheumatoid arthritis, unspecified: Secondary | ICD-10-CM

## 2024-10-18 DIAGNOSIS — F439 Reaction to severe stress, unspecified: Secondary | ICD-10-CM | POA: Diagnosis not present

## 2024-10-18 DIAGNOSIS — D75839 Thrombocytosis, unspecified: Secondary | ICD-10-CM

## 2024-10-18 DIAGNOSIS — Z1231 Encounter for screening mammogram for malignant neoplasm of breast: Secondary | ICD-10-CM

## 2024-10-18 DIAGNOSIS — D72829 Elevated white blood cell count, unspecified: Secondary | ICD-10-CM | POA: Diagnosis not present

## 2024-10-18 DIAGNOSIS — Z0000 Encounter for general adult medical examination without abnormal findings: Secondary | ICD-10-CM

## 2024-10-18 DIAGNOSIS — R0683 Snoring: Secondary | ICD-10-CM | POA: Diagnosis not present

## 2024-10-18 DIAGNOSIS — E7800 Pure hypercholesterolemia, unspecified: Secondary | ICD-10-CM | POA: Diagnosis not present

## 2024-10-18 LAB — CBC WITH DIFFERENTIAL/PLATELET
Basophils Absolute: 0.1 K/uL (ref 0.0–0.1)
Basophils Relative: 1.1 % (ref 0.0–3.0)
Eosinophils Absolute: 0.1 K/uL (ref 0.0–0.7)
Eosinophils Relative: 1.7 % (ref 0.0–5.0)
HCT: 41.7 % (ref 36.0–46.0)
Hemoglobin: 14 g/dL (ref 12.0–15.0)
Lymphocytes Relative: 47 % — ABNORMAL HIGH (ref 12.0–46.0)
Lymphs Abs: 3.1 K/uL (ref 0.7–4.0)
MCHC: 33.6 g/dL (ref 30.0–36.0)
MCV: 96.1 fl (ref 78.0–100.0)
Monocytes Absolute: 0.6 K/uL (ref 0.1–1.0)
Monocytes Relative: 9.4 % (ref 3.0–12.0)
Neutro Abs: 2.7 K/uL (ref 1.4–7.7)
Neutrophils Relative %: 40.8 % — ABNORMAL LOW (ref 43.0–77.0)
Platelets: 429 K/uL — ABNORMAL HIGH (ref 150.0–400.0)
RBC: 4.34 Mil/uL (ref 3.87–5.11)
RDW: 14.8 % (ref 11.5–15.5)
WBC: 6.6 K/uL (ref 4.0–10.5)

## 2024-10-18 LAB — LIPID PANEL
Cholesterol: 174 mg/dL (ref 0–200)
HDL: 48.3 mg/dL (ref >39.00)
LDL Cholesterol: 100 mg/dL — ABNORMAL HIGH (ref 0–99)
NonHDL: 125.25
Total CHOL/HDL Ratio: 4
Triglycerides: 127 mg/dL (ref 0.0–149.0)
VLDL: 25.4 mg/dL (ref 0.0–40.0)

## 2024-10-18 LAB — BASIC METABOLIC PANEL WITH GFR
BUN: 12 mg/dL (ref 6–23)
CO2: 27 meq/L (ref 19–32)
Calcium: 9.6 mg/dL (ref 8.4–10.5)
Chloride: 103 meq/L (ref 96–112)
Creatinine, Ser: 0.76 mg/dL (ref 0.40–1.20)
GFR: 85.96 mL/min (ref >60.00)
Glucose, Bld: 78 mg/dL (ref 70–99)
Potassium: 4.3 meq/L (ref 3.5–5.1)
Sodium: 137 meq/L (ref 135–145)

## 2024-10-18 LAB — HEPATIC FUNCTION PANEL
ALT: 20 U/L (ref 0–35)
AST: 22 U/L (ref 0–37)
Albumin: 4.4 g/dL (ref 3.5–5.2)
Alkaline Phosphatase: 111 U/L (ref 39–117)
Bilirubin, Direct: 0.1 mg/dL (ref 0.0–0.3)
Total Bilirubin: 0.4 mg/dL (ref 0.2–1.2)
Total Protein: 7.5 g/dL (ref 6.0–8.3)

## 2024-10-18 LAB — TSH: TSH: 2.19 u[IU]/mL (ref 0.35–5.50)

## 2024-10-18 MED ORDER — BUSPIRONE HCL 5 MG PO TABS: 5.0000 mg | ORAL_TABLET | Freq: Every day | ORAL | 1 refills | Status: AC

## 2024-10-18 NOTE — Assessment & Plan Note (Signed)
Continue humira and MTX.  Doing well.  Overall relatively stable. Continue f/u with Dr Allena Katz

## 2024-10-18 NOTE — Progress Notes (Signed)
 Subjective:    Patient ID: Robin Kelly, female    DOB: May 02, 1966, 58 y.o.   MRN: [id number]  Patient here for  Chief Complaint  Patient presents with   Annual Exam    CPE    HPI Here for a physical exam. Had f/u Dr Tobie 07/12/24 - f/u RA. Continues on methotrexate and humira. Recommended to try voltaren gel  - to help with knee pain. Has f/u planned next month. Previously evaluated by Dr Babara for lymphocytosis. Felt her leucocytosis (predominantly lymphocytosis) -most likely reactive secondary to her autoimmune disease/RF. No M protein on SPEP. Discharged from hematology. Remains on citalopram  and buspar . Overall appears to be doing relatively well. Handling stress. Working. No chest pain reported. No increased cough or congestion. Does rpeort increased snoring and nocturia. Discussed possible sleep apnea. Agreeable for pulmonary referral for evaluation. Sees Dr Leonce for her gyn physicals. States due 11/2024.    Past Medical History:  Diagnosis Date   Allergy    Anemia    Anxiety    due to heavy bleeding and SOB   Arthritis    Cancer (HCC) 2004   melanoma   Heavy menstrual bleeding    Melanoma (HCC) 2004   abdomen, txted by Dr. Ubaldo Cleveland   Rheumatoid arthritis (HCC)    Seasonal allergies    Shortness of breath dyspnea    due to anemia   Past Surgical History:  Procedure Laterality Date   ABDOMINAL HYSTERECTOMY     APPENDECTOMY  2001   right tube and ovary removed also.   BREAST BIOPSY Right 07/28/2019   us  bx, - BENIGN MAMMARY PARENCHYMA WITH FIBROCYSTIC    CESAREAN SECTION     x2   CHOLECYSTECTOMY     CYSTOSCOPY N/A 03/12/2016   Procedure: CYSTOSCOPY;  Surgeon: Garnette JONETTA Leonce, MD;  Location: [facility];  Service: Gynecology;  Laterality: N/A;   DILATATION & CURETTAGE/HYSTEROSCOPY WITH MYOSURE N/A 02/06/2016   Procedure: DILATATION & CURETTAGE HYSTEROSCOPY  polypectomy;  Surgeon: Garnette JONETTA Leonce, MD;  Location: [facility];  Service: Gynecology;  Laterality:  N/A;   POLYPECTOMY N/A 02/06/2016   Procedure: POLYPECTOMY;  Surgeon: Garnette JONETTA Leonce, MD;  Location: [facility];  Service: Gynecology;  Laterality: N/A;   rheumatoid arthritis     ROBOTIC ASSISTED TOTAL HYSTERECTOMY WITH SALPINGECTOMY Bilateral 03/12/2016   Converted to total abdominal hysterectom   SALPINGOOPHORECTOMY Right    during appendectomy.   Family History  Problem Relation Age of Onset   Hypertension Mother    Hypertension Father    Heart failure Father    Cancer Father    Diabetes Paternal Grandmother    Breast cancer Neg Hx    Social History   Socioeconomic History   Marital status: Widowed    Spouse name: Not on file   Number of children: Not on file   Years of education: Not on file   Highest education level: Some college, no degree  Occupational History   Not on file  Tobacco Use   Smoking status: Never   Smokeless tobacco: Never  Vaping Use   Vaping status: Never Used  Substance and Sexual Activity   Alcohol use: No   Drug use: No   Sexual activity: Not on file  Other Topics Concern   Not on file  Social History Narrative   Not on file   Social Drivers of Health   Financial Resource Strain: Low Risk  (06/30/2023)   Overall Financial Resource Strain (CARDIA)  Difficulty of Paying Living Expenses: Not very hard  Food Insecurity: No Food Insecurity (06/30/2023)   Hunger Vital Sign    Worried About Running Out of Food in the Last Year: Never true    Ran Out of Food in the Last Year: Never true  Transportation Needs: No Transportation Needs (06/30/2023)   PRAPARE - Administrator, Civil Service (Medical): No    Lack of Transportation (Non-Medical): No  Physical Activity: Insufficiently Active (06/30/2023)   Exercise Vital Sign    Days of Exercise per Week: 3 days    Minutes of Exercise per Session: 30 min  Stress: No Stress Concern Present (06/30/2023)   [employer] of Occupational Health - Occupational Stress Questionnaire    Feeling of  Stress : Only a little  Social Connections: Moderately Integrated (06/30/2023)   Social Connection and Isolation Panel    Frequency of Communication with Friends and Family: More than three times a week    Frequency of Social Gatherings with Friends and Family: Three times a week    Attends Religious Services: More than 4 times per year    Active Member of Clubs or Organizations: Yes    Attends Banker Meetings: 1 to 4 times per year    Marital Status: Widowed     Review of Systems  Constitutional:  Negative for appetite change and unexpected weight change.  HENT:  Negative for congestion, sinus pressure and sore throat.   Eyes:  Negative for pain and visual disturbance.  Respiratory:  Negative for cough, chest tightness and shortness of breath.   Cardiovascular:  Negative for chest pain, palpitations and leg swelling.  Gastrointestinal:  Negative for abdominal pain, diarrhea, nausea and vomiting.  Genitourinary:  Negative for difficulty urinating and dysuria.       Nocturia.   Musculoskeletal:  Negative for joint swelling and myalgias.  Skin:  Negative for color change and rash.  Neurological:  Negative for dizziness and headaches.  Hematological:  Negative for adenopathy. Does not bruise/bleed easily.  Psychiatric/Behavioral:  Negative for agitation and dysphoric mood.        Objective:     BP 120/72   Pulse 78   Temp 98.8 F (37.1 C) (Oral)   Ht 5' 2 (1.575 m)   Wt 194 lb (88 kg)   LMP 10/23/2015 Comment: bleeding since 10/23/15  SpO2 97%   BMI 35.48 kg/m  Wt Readings from Last 3 Encounters:  10/18/24 194 lb (88 kg)  12/10/23 192 lb 3.2 oz (87.2 kg)  09/04/23 185 lb (83.9 kg)    Physical Exam Vitals reviewed.  Constitutional:      General: She is not in acute distress.    Appearance: Normal appearance.  HENT:     Head: Normocephalic and atraumatic.     Right Ear: External ear normal.     Left Ear: External ear normal.     Mouth/Throat:      Pharynx: No oropharyngeal exudate or posterior oropharyngeal erythema.  Eyes:     General: No scleral icterus.       Right eye: No discharge.        Left eye: No discharge.     Conjunctiva/sclera: Conjunctivae normal.  Neck:     Thyroid : No thyromegaly.  Cardiovascular:     Rate and Rhythm: Normal rate and regular rhythm.  Pulmonary:     Effort: No respiratory distress.     Breath sounds: Normal breath sounds. No wheezing.  Abdominal:  General: Bowel sounds are normal.     Palpations: Abdomen is soft.     Tenderness: There is no abdominal tenderness.  Musculoskeletal:        General: No swelling or tenderness.     Cervical back: Neck supple. No tenderness.  Lymphadenopathy:     Cervical: No cervical adenopathy.  Skin:    Findings: No erythema or rash.  Neurological:     Mental Status: She is alert.  Psychiatric:        Mood and Affect: Mood normal.        Behavior: Behavior normal.         Outpatient Encounter Medications as of 10/18/2024  Medication Sig   Adalimumab 40 MG/0.8ML PNKT Inject 40 mg into the skin every 14 (fourteen) days. Humira   aspirin 325 MG tablet Take 325 mg by mouth daily.   citalopram  (CELEXA ) 40 MG tablet TAKE 1 TABLET BY MOUTH EVERY DAY   folic acid (FOLVITE) 1 MG tablet Take 1 mg by mouth daily.   methotrexate (RHEUMATREX) 2.5 MG tablet Take by mouth.   busPIRone  (BUSPAR ) 5 MG tablet Take 1 tablet (5 mg total) by mouth daily.   [DISCONTINUED] busPIRone  (BUSPAR ) 5 MG tablet TAKE 1 TABLET (5 MG TOTAL) BY MOUTH DAILY.   [DISCONTINUED] doxycycline  (VIBRA -TABS) 100 MG tablet Take 1 tablet (100 mg total) by mouth 2 (two) times daily. (Patient not taking: Reported on 08/27/2024)   [DISCONTINUED] fluticasone  (FLONASE ) 50 MCG/ACT nasal spray Place 2 sprays into both nostrils daily. (Patient not taking: Reported on 10/18/2024)   [DISCONTINUED] ipratropium (ATROVENT ) 0.06 % nasal spray Place 2 sprays into both nostrils 4 (four) times daily. (Patient not  taking: Reported on 10/18/2024)   [DISCONTINUED] neomycin -polymyxin-hydrocortisone (CORTISPORIN) 3.5-10000-1 OTIC suspension Place 4 drops into the right ear 3 (three) times daily. (Patient not taking: Reported on 10/18/2024)   [DISCONTINUED] pseudoephedrine  (SUDAFED) 120 MG 12 hr tablet Take 1 tablet (120 mg total) by mouth 2 (two) times daily. (Patient not taking: Reported on 10/18/2024)   [DISCONTINUED] Semaglutide -Weight Management (WEGOVY ) 0.25 MG/0.5ML SOAJ Inject 0.25 mg into the skin once a week.   No facility-administered encounter medications on file as of 10/18/2024.     Lab Results  Component Value Date   WBC 6.6 10/18/2024   HGB 14.0 10/18/2024   HCT 41.7 10/18/2024   PLT 429.0 (H) 10/18/2024   GLUCOSE 78 10/18/2024   CHOL 174 10/18/2024   TRIG 127.0 10/18/2024   HDL 48.30 10/18/2024   LDLCALC 100 (H) 10/18/2024   ALT 20 10/18/2024   AST 22 10/18/2024   NA 137 10/18/2024   K 4.3 10/18/2024   CL 103 10/18/2024   CREATININE 0.76 10/18/2024   BUN 12 10/18/2024   CO2 27 10/18/2024   TSH 2.19 10/18/2024       Assessment & Plan:  Rheumatoid arthritis, involving unspecified site, unspecified whether rheumatoid factor present [facility]) Assessment & Plan: Continue humira and MTX.  Doing well.  Overall relatively stable. Continue f/u with Dr Tobie    Leukocytosis, unspecified type -     CBC with Differential/Platelet  Hypercholesteremia Assessment & Plan: Low cholesterol diet and exercise. Follow lipid panel. Discussed CT calcium score. Notify if agreeable.   Orders: -     Basic metabolic panel with GFR -     Hepatic function panel -     Lipid panel -     TSH  Visit for screening mammogram -     3D Screening Mammogram, Left and  Right  Health care maintenance Assessment & Plan: Physical today 10/18/24. PAP through gyn. Sees Dr Leonce. Report due 11/2024. Cologuard 11/22/20 - negative. Per report colonoscopy 2022. Need results. Mammogram 09/01/23 - [facility] I.  Schedule f/u mammogram.    Snoring Assessment & Plan: Increased snoring. Nocturia. Discussed possible sleep apnea. Agreeable for further w/up and evaluation. Refer to pulmonary.   Orders: -     Pulmonary Visit  Thrombocytosis Assessment & Plan: Previously evaluated by Dr Babara for lymphocytosis. Felt her leucocytosis (predominantly lymphocytosis) -most likely reactive secondary to her autoimmune disease/RF. No M protein on SPEP. Discharged from hematology. Follow cbc.    Stress Assessment & Plan: Continue citalopram  and buspar . Overall appears to be handling things well. Follow.    Other orders -     busPIRone  HCl; Take 1 tablet (5 mg total) by mouth daily.  Dispense: 90 tablet; Refill: 1     Allena Hamilton, MD

## 2024-10-18 NOTE — Telephone Encounter (Signed)
 Record request for Pap Smear, Progress notes and mammogram sent to Dr.Steven Leonce at [facility] via epic

## 2024-10-18 NOTE — Assessment & Plan Note (Addendum)
 Physical today 10/18/24. PAP through gyn. Sees Dr Leonce. Report due 11/2024. Cologuard 11/22/20 - negative. Per report colonoscopy 2022. Need results. Mammogram 09/01/23 - Briads I. Schedule f/u mammogram.

## 2024-10-19 ENCOUNTER — Ambulatory Visit: Payer: Self-pay | Admitting: Internal Medicine

## 2024-10-19 ENCOUNTER — Other Ambulatory Visit: Payer: Self-pay | Admitting: *Deleted

## 2024-10-19 DIAGNOSIS — D72829 Elevated white blood cell count, unspecified: Secondary | ICD-10-CM

## 2024-10-19 NOTE — Telephone Encounter (Addendum)
 Left voicemail to return call & sent results via mychart. Future lab ordered for non-fating lab in 2-3 weeks. (Needs lab appt)

## 2024-10-24 ENCOUNTER — Encounter: Payer: Self-pay | Admitting: Internal Medicine

## 2024-10-24 NOTE — Assessment & Plan Note (Signed)
 Increased snoring. Nocturia. Discussed possible sleep apnea. Agreeable for further w/up and evaluation. Refer to pulmonary.

## 2024-10-24 NOTE — Assessment & Plan Note (Signed)
 Continue citalopram  and buspar . Overall appears to be handling things well. Follow.

## 2024-10-24 NOTE — Assessment & Plan Note (Signed)
 Previously evaluated by Dr Babara for lymphocytosis. Felt her leucocytosis (predominantly lymphocytosis) -most likely reactive secondary to her autoimmune disease/RF. No M protein on SPEP. Discharged from hematology. Follow cbc.

## 2024-10-24 NOTE — Assessment & Plan Note (Signed)
 Low cholesterol diet and exercise. Follow lipid panel. Discussed CT calcium score. Notify if agreeable.

## 2024-10-29 ENCOUNTER — Telehealth: Payer: Self-pay | Admitting: Internal Medicine

## 2024-10-29 NOTE — Telephone Encounter (Signed)
 Pt notified via mychart

## 2024-10-29 NOTE — Telephone Encounter (Signed)
 I received notification that she is overdue a mammogram. Order is in epic. Need to schedule.

## 2024-11-02 ENCOUNTER — Ambulatory Visit: Admitting: Sleep Medicine

## 2024-11-02 ENCOUNTER — Encounter: Payer: Self-pay | Admitting: Sleep Medicine

## 2024-11-02 VITALS — BP 116/62 | HR 90 | Temp 98.5°F | Ht 62.0 in | Wt 196.0 lb

## 2024-11-02 DIAGNOSIS — E669 Obesity, unspecified: Secondary | ICD-10-CM | POA: Diagnosis not present

## 2024-11-02 DIAGNOSIS — G4733 Obstructive sleep apnea (adult) (pediatric): Secondary | ICD-10-CM | POA: Diagnosis not present

## 2024-11-02 NOTE — Patient Instructions (Addendum)
 SABRA

## 2024-11-02 NOTE — Progress Notes (Signed)
 Name:Robin Kelly MRN: [id number] DOB: 1966/06/30   CHIEF COMPLAINT:  EXCESSIVE DAYTIME SLEEPINESS   HISTORY OF PRESENT ILLNESS: Robin Kelly is a 58 y.o. w/ a h/o RA, anxiety and obesity who present for c/o loud snoring and excessive daytime sleepiness which has been present for several years. Reports nocturnal awakenings due to nightmares, however does not have difficulty falling back to sleep. Reports significant weight changes. Denies morning headaches, RLS symptoms, dream enactment, cataplexy, hypnagogic or hypnapompic hallucinations. Reports a family history of sleep apnea. Denies drowsy driving. Drinks 1 cup of coffee daily, denies alcohol, tobacco or illicit drug use.   Bedtime 8:30 pm Sleep onset 20 mins Rise time 6 am   EPWORTH SLEEP SCORE 6    11/02/2024    2:34 PM  Results of the Epworth flowsheet  Sitting and reading 1  Watching TV 1  Sitting, inactive in a public place (e.g. a theatre or a meeting) 0  As a passenger in a car for an hour without a break 1  Lying down to rest in the afternoon when circumstances permit 2  Sitting and talking to someone 0  Sitting quietly after a lunch without alcohol 1  In a car, while stopped for a few minutes in traffic 0  Total score 6    PAST MEDICAL HISTORY :   has a past medical history of Allergy, Anemia, Anxiety, Arthritis, Cancer (HCC) (2004), Heavy menstrual bleeding, Melanoma (HCC) (2004), Rheumatoid arthritis (HCC), Seasonal allergies, and Shortness of breath dyspnea.  has a past surgical history that includes Cholecystectomy; Cesarean section; Dilatation & curettage/hysteroscopy with myosure (N/A, 02/06/2016); polypectomy (N/A, 02/06/2016); Salpingoophorectomy (Right); Appendectomy (2001); Robotic assisted total hysterectomy with salpingectomy (Bilateral, 03/12/2016); Cystoscopy (N/A, 03/12/2016); Abdominal hysterectomy; Breast biopsy (Right, 07/28/2019); and rheumatoid arthritis. Prior to Admission medications    Medication Sig Start Date End Date Taking? Authorizing Provider  Adalimumab 40 MG/0.8ML PNKT Inject 40 mg into the skin every 14 (fourteen) days. Humira   Yes [provider]  aspirin 325 MG tablet Take 325 mg by mouth daily.   Yes [provider]  busPIRone  (BUSPAR ) 5 MG tablet Take 1 tablet (5 mg total) by mouth daily. 10/18/24  Yes Robin Shad, MD  citalopram  (CELEXA ) 40 MG tablet TAKE 1 TABLET BY MOUTH EVERY DAY 09/29/24  Yes Robin Shad, MD  folic acid (FOLVITE) 1 MG tablet Take 1 mg by mouth daily.   Yes [provider]  methotrexate (RHEUMATREX) 2.5 MG tablet Take by mouth. 02/04/23  Yes [provider]   Allergies  Allergen Reactions   Penicillins Swelling   Oxycodone Palpitations    FAMILY HISTORY:  family history includes Cancer in her father; Diabetes in her paternal grandmother; Heart failure in her father; Hypertension in her father and mother. SOCIAL HISTORY:  reports that she has never smoked. She has never used smokeless tobacco. She reports that she does not drink alcohol and does not use drugs.   Review of Systems:  Gen:  Denies  fever, sweats, chills weight loss  HEENT: Denies blurred vision, double vision, ear pain, eye pain, hearing loss, nose bleeds, sore throat Cardiac:  No dizziness, chest pain or heaviness, chest tightness,edema, No JVD Resp:   No cough, -sputum production, -shortness of breath,-wheezing, -hemoptysis,  Gi: Denies swallowing difficulty, stomach pain, nausea or vomiting, diarrhea, constipation, bowel incontinence Gu:  Denies bladder incontinence, burning urine Ext:   Denies Joint pain, stiffness or swelling Skin: Denies  skin rash, easy  bruising or bleeding or hives Endoc:  Denies polyuria, polydipsia , polyphagia or weight change Psych:   Denies depression, insomnia or hallucinations  Other:  All other systems negative  VITAL SIGNS: BP (!) 146/96   Pulse 90   Temp 98.5 F (36.9 C)   Ht 5' 2  (1.575 m)   Wt 196 lb (88.9 kg)   LMP 10/23/2015 Comment: bleeding since 10/23/15  SpO2 98%   BMI 35.85 kg/m    Physical Examination:   General Appearance: No distress  EYES PERRLA, EOM intact.   NECK Supple, No JVD Pulmonary: normal breath sounds, No wheezing.  CardiovascularNormal S1,S2.  No m/r/g.   Abdomen: Benign, Soft, non-tender. Skin:   warm, no rashes, no ecchymosis  Extremities: normal, no cyanosis, clubbing. Neuro:without focal findings,  speech normal  PSYCHIATRIC: Mood, affect within normal limits.   ASSESSMENT AND PLAN  OSA I suspect that OSA is likely present due to clinical presentation. Discussed the consequences of untreated sleep apnea. Advised not to drive drowsy for safety of patient and others. Will complete further evaluation with a home sleep study and follow up to review results.    Obesity Counseled patient on diet and lifestyle modification.    MEDICATION ADJUSTMENTS/LABS AND TESTS ORDERED: Recommend Sleep Study   Patient  satisfied with Plan of action and management. All questions answered  Follow up to review HST results and treatment plan.   I spent a total of 30 minutes reviewing chart data, face-to-face evaluation with the patient, counseling and coordination of care as detailed above.    Robin Kelly, M.D.  Sleep Medicine [facility]

## 2024-11-15 ENCOUNTER — Ambulatory Visit: Admitting: Dermatology

## 2024-11-19 ENCOUNTER — Telehealth: Admitting: Family Medicine

## 2024-11-19 DIAGNOSIS — J019 Acute sinusitis, unspecified: Secondary | ICD-10-CM | POA: Diagnosis not present

## 2024-11-19 DIAGNOSIS — B9689 Other specified bacterial agents as the cause of diseases classified elsewhere: Secondary | ICD-10-CM | POA: Diagnosis not present

## 2024-11-19 MED ORDER — DOXYCYCLINE HYCLATE 100 MG PO TABS: 100.0000 mg | ORAL_TABLET | Freq: Two times a day (BID) | ORAL | 0 refills | Status: AC

## 2024-11-19 NOTE — Progress Notes (Signed)
 " Virtual Visit Consent   Robin Kelly, you are scheduled for a virtual visit with a [facility] provider today. Just as with appointments in the office, your consent must be obtained to participate. Your consent will be active for this visit and any virtual visit you may have with one of our providers in the next 365 days. If you have a MyChart account, a copy of this consent can be sent to you electronically.  As this is a virtual visit, video technology does not allow for your provider to perform a traditional examination. This may limit your provider's ability to fully assess your condition. If your provider identifies any concerns that need to be evaluated in person or the need to arrange testing (such as labs, EKG, etc.), we will make arrangements to do so. Although advances in technology are sophisticated, we cannot ensure that it will always work on either your end or our end. If the connection with a video visit is poor, the visit may have to be switched to a telephone visit. With either a video or telephone visit, we are not always able to ensure that we have a secure connection.  By engaging in this virtual visit, you consent to the provision of healthcare and authorize for your insurance to be billed (if applicable) for the services provided during this visit. Depending on your insurance coverage, you may receive a charge related to this service.  I need to obtain your verbal consent now. Are you willing to proceed with your visit today? Robin Kelly has provided verbal consent on 11/19/2024 for a virtual visit (video or telephone). Loa Lamp, FNP  Date: 11/19/2024 4:46 PM   Virtual Visit via Video Note   I, Loa Lamp, connected with  Robin Kelly  ([id number], Mar 05, 1966) on 11/19/2024 at  5:00 PM EST by a video-enabled telemedicine application and verified that I am speaking with the correct person using two identifiers.  Location: Patient: Virtual Visit Location Patient:  Home Provider: Virtual Visit Location Provider: Home Office   I discussed the limitations of evaluation and management by telemedicine and the availability of in person appointments. The patient expressed understanding and agreed to proceed.    History of Present Illness: Robin Kelly is a 58 y.o. 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 Coughing at night. Robin Kelly  HPI: HPI  Problems:  Patient Active Problem List   Diagnosis Date Noted   Snoring 10/18/2024   Hypercholesteremia 09/04/2023   Weight loss counseling, encounter for 09/04/2023   Sinusitis 07/03/2023   Vaginitis 05/26/2022   Encounter for completion of form with patient 01/14/2022   Breast cancer screening 08/05/2021   Rash 08/21/2019   Colon cancer screening 08/21/2019   Cervical cancer screening 08/21/2019   Stress 04/05/2019   Leukocytosis 12/13/2018   Rheumatoid arthritis (HCC) 12/13/2018   Elevated blood pressure reading 12/13/2018   Thrombocytosis 10/13/2017   Seronegative arthritis 05/14/2017   Health care maintenance 04/20/2017   Left knee pain 03/09/2017   Pain of right heel 09/15/2016   Anemia 03/14/2016   Status post abdominal hysterectomy 03/12/2016   Status post hysterectomy 03/12/2016   Menorrhagia with irregular cycle 02/06/2016   Endometrial polyp 02/06/2016   Fibroids 02/06/2016    Allergies: Allergies[1] Medications: Current Medications[2]  Observations/Objective:  Patient is well-developed, well-nourished in no acute distress.  Resting comfortably  at home.  Head is normocephalic, atraumatic.  No labored breathing.  Speech is clear and coherent with logical content.  Patient is alert and oriented at baseline.    Assessment and Plan: 1. Acute bacterial sinusitis (Primary)  Increase fluids humidifier at night, UC  if sx worsen. Flonase   Follow Up Instructions: I discussed the assessment and treatment plan with the patient. The patient was provided an opportunity to ask questions and all were answered. The patient agreed with the plan and demonstrated an understanding of the instructions.  A copy of instructions were sent to the patient via MyChart unless otherwise noted below.     The patient was advised to call back or seek an in-person evaluation if the symptoms worsen or if the condition fails to improve as anticipated.    Ivi Griffith, FNP     [1]  Allergies Allergen Reactions   Penicillins Swelling   Oxycodone Palpitations  [2]  Current Outpatient Medications:    Adalimumab 40 MG/0.8ML PNKT, Inject 40 mg into the skin every 14 (fourteen) days. Humira, Disp: , Rfl:    aspirin 325 MG tablet, Take 325 mg by mouth daily., Disp: , Rfl:    busPIRone  (BUSPAR ) 5 MG tablet, Take 1 tablet (5 mg total) by mouth daily., Disp: 90 tablet, Rfl: 1   citalopram  (CELEXA ) 40 MG tablet, TAKE 1 TABLET BY MOUTH EVERY DAY, Disp: 90 tablet, Rfl: 1   folic acid (FOLVITE) 1 MG tablet, Take 1 mg by mouth daily., Disp: , Rfl:    methotrexate (RHEUMATREX) 2.5 MG tablet, Take by mouth., Disp: , Rfl:   "

## 2024-11-19 NOTE — Patient Instructions (Signed)

## 2024-12-07 ENCOUNTER — Other Ambulatory Visit: Payer: Self-pay | Admitting: Medical Genetics

## 2024-12-13 ENCOUNTER — Other Ambulatory Visit

## 2024-12-13 ENCOUNTER — Ambulatory Visit
Admission: RE | Admit: 2024-12-13 | Discharge: 2024-12-13 | Disposition: A | Source: Ambulatory Visit | Attending: Internal Medicine | Admitting: Internal Medicine

## 2024-12-13 DIAGNOSIS — Z1231 Encounter for screening mammogram for malignant neoplasm of breast: Secondary | ICD-10-CM | POA: Diagnosis present

## 2024-12-27 ENCOUNTER — Ambulatory Visit: Admitting: Dermatology

## 2025-01-05 ENCOUNTER — Ambulatory Visit: Admitting: Dermatology

## 2025-02-15 ENCOUNTER — Ambulatory Visit: Admitting: Internal Medicine
